# Patient Record
Sex: Female | Born: 1945 | Race: Black or African American | Hispanic: No | Marital: Single | Smoking: Current every day smoker
Health system: Southern US, Community
[De-identification: ages and names within clinical notes are randomized; demographics above are authoritative.]

## PROBLEM LIST (undated history)

## (undated) DIAGNOSIS — K4190 Unilateral femoral hernia, without obstruction or gangrene, not specified as recurrent: Secondary | ICD-10-CM

## (undated) DIAGNOSIS — J189 Pneumonia, unspecified organism: Secondary | ICD-10-CM

## (undated) DIAGNOSIS — D649 Anemia, unspecified: Secondary | ICD-10-CM

## (undated) DIAGNOSIS — R011 Cardiac murmur, unspecified: Secondary | ICD-10-CM

## (undated) DIAGNOSIS — N189 Chronic kidney disease, unspecified: Secondary | ICD-10-CM

## (undated) DIAGNOSIS — I10 Essential (primary) hypertension: Secondary | ICD-10-CM

## (undated) DIAGNOSIS — E785 Hyperlipidemia, unspecified: Secondary | ICD-10-CM

## (undated) HISTORY — PX: COLONOSCOPY: SHX174

## (undated) HISTORY — DX: Essential (primary) hypertension: I10

## (undated) HISTORY — PX: TONSILLECTOMY AND ADENOIDECTOMY: SUR1326

## (undated) HISTORY — DX: Hyperlipidemia, unspecified: E78.5

## (undated) HISTORY — DX: Anemia, unspecified: D64.9

## (undated) HISTORY — DX: Cardiac murmur, unspecified: R01.1

---

## 2004-08-20 ENCOUNTER — Ambulatory Visit: Payer: Self-pay | Admitting: *Deleted

## 2006-10-27 ENCOUNTER — Other Ambulatory Visit: Admission: RE | Admit: 2006-10-27 | Discharge: 2006-10-27 | Payer: Self-pay | Admitting: Internal Medicine

## 2011-01-01 ENCOUNTER — Other Ambulatory Visit (HOSPITAL_COMMUNITY): Payer: Self-pay | Admitting: Internal Medicine

## 2011-01-01 ENCOUNTER — Other Ambulatory Visit (HOSPITAL_BASED_OUTPATIENT_CLINIC_OR_DEPARTMENT_OTHER): Payer: Self-pay | Admitting: Internal Medicine

## 2011-01-01 DIAGNOSIS — Z1231 Encounter for screening mammogram for malignant neoplasm of breast: Secondary | ICD-10-CM

## 2011-01-09 ENCOUNTER — Ambulatory Visit (HOSPITAL_COMMUNITY)
Admission: RE | Admit: 2011-01-09 | Discharge: 2011-01-09 | Disposition: A | Discharge status: Home / Self Care | Payer: Self-pay | Source: Ambulatory Visit | Attending: Internal Medicine | Admitting: Internal Medicine

## 2011-01-09 DIAGNOSIS — Z1231 Encounter for screening mammogram for malignant neoplasm of breast: Secondary | ICD-10-CM | POA: Insufficient documentation

## 2013-06-23 ENCOUNTER — Encounter: Payer: Self-pay | Admitting: Gastroenterology

## 2013-06-23 ENCOUNTER — Other Ambulatory Visit: Payer: Self-pay | Admitting: Family Medicine

## 2013-06-23 DIAGNOSIS — Z1231 Encounter for screening mammogram for malignant neoplasm of breast: Secondary | ICD-10-CM

## 2013-06-30 ENCOUNTER — Encounter (INDEPENDENT_AMBULATORY_CARE_PROVIDER_SITE_OTHER): Payer: Self-pay | Admitting: Surgery

## 2013-06-30 ENCOUNTER — Ambulatory Visit (INDEPENDENT_AMBULATORY_CARE_PROVIDER_SITE_OTHER): Payer: Medicare Other | Admitting: Surgery

## 2013-06-30 VITALS — BP 152/82 | HR 76 | Resp 16 | Ht 64.0 in | Wt 137.8 lb

## 2013-06-30 DIAGNOSIS — K4190 Unilateral femoral hernia, without obstruction or gangrene, not specified as recurrent: Secondary | ICD-10-CM

## 2013-06-30 HISTORY — DX: Unilateral femoral hernia, without obstruction or gangrene, not specified as recurrent: K41.90

## 2013-06-30 NOTE — Patient Instructions (Addendum)
Hernia A hernia occurs when an internal organ pushes out through a weak spot in the abdominal wall. Hernias most commonly occur in the groin and around the navel. Hernias often can be pushed back into place (reduced). Most hernias tend to get worse over time. Some abdominal hernias can get stuck in the opening (irreducible or incarcerated hernia) and cannot be reduced. An irreducible abdominal hernia which is tightly squeezed into the opening is at risk for impaired blood supply (strangulated hernia). A strangulated hernia is a medical emergency. Because of the risk for an irreducible or strangulated hernia, surgery may be recommended to repair a hernia. CAUSES   Heavy lifting.  Prolonged coughing.  Straining to have a bowel movement.  A cut (incision) made during an abdominal surgery. HOME CARE INSTRUCTIONS   Bed rest is not required. You may continue your normal activities.  Avoid lifting more than 10 pounds (4.5 kg) or straining.  Cough gently. If you are a smoker it is best to stop. Even the best hernia repair can break down with the continual strain of coughing. Even if you do not have your hernia repaired, a cough will continue to aggravate the problem.  Do not wear anything tight over your hernia. Do not try to keep it in with an outside bandage or truss. These can damage abdominal contents if they are trapped within the hernia sac.  Eat a normal diet.  Avoid constipation. Straining over long periods of time will increase hernia size and encourage breakdown of repairs. If you cannot do this with diet alone, stool softeners may be used. SEEK IMMEDIATE MEDICAL CARE IF:   You have a fever.  You develop increasing abdominal pain.  You feel nauseous or vomit.  Your hernia is stuck outside the abdomen, looks discolored, feels hard, or is tender.  You have any changes in your bowel habits or in the hernia that are unusual for you.  You have increased pain or swelling around the  hernia.  You cannot push the hernia back in place by applying gentle pressure while lying down. MAKE SURE YOU:   Understand these instructions.  Will watch your condition.  Will get help right away if you are not doing well or get worse. Document Released: 10/14/2005 Document Revised: 01/06/2012 Document Reviewed: 06/02/2008 [facility] Patient Information 2014 [place].    CALL TO SET UP HERNIA SURGERY WHEN READY.

## 2013-06-30 NOTE — Progress Notes (Signed)
Patient ID: Bethany [name], female   DOB: 26-Jun-1946, 67 y.o.   MRN: [id number]  Chief Complaint  Patient presents with  . New Evaluation    eval ing hernia    HPI Bethany [name] is a 67 y.o. female.  Patient sent at the request of Dr. Kennon Holter for left groin bulge. Patient noticed it while picking up a small child. The bulge is more noticeable when she stands, lifts or coughs. It is located left groin. It is minimally tender. No associated nausea or vomiting. HPI  Past Medical History  Diagnosis Date  . Hypertension     Past Surgical History  Procedure Laterality Date  . Tonsillectomy and adenoidectomy      History reviewed. No pertinent family history.  Social History History  Substance Use Topics  . Smoking status: Current Every Day Smoker -- 0.50 packs/day  . Smokeless tobacco: Never Used  . Alcohol Use: No    No Known Allergies  Current Outpatient Prescriptions  Medication Sig Dispense Refill  . hydrochlorothiazide (HYDRODIURIL) 25 MG tablet Take 25 mg by mouth daily.       No current facility-administered medications for this visit.    Review of Systems Review of Systems  Constitutional: Negative.   HENT: Negative.   Eyes: Negative.   Respiratory: Positive for cough and wheezing.   Cardiovascular: Negative.   Gastrointestinal: Negative.   Endocrine: Negative.   Genitourinary: Negative.   Musculoskeletal: Negative.   Skin: Negative.   Allergic/Immunologic: Negative.   Neurological: Negative.   Hematological: Negative.   Psychiatric/Behavioral: Negative.     Blood pressure 152/82, pulse 76, resp. rate 16, height $RemoveBe'5\' 4"'eZAPSONpM$  (1.626 m), weight 137 lb 12.8 oz (62.506 kg).  Physical Exam Physical Exam  Constitutional: She is oriented to person, place, and time.    HENT:  Head: Normocephalic and atraumatic.  Eyes: EOM are normal. Pupils are equal, round, and reactive to light.  Neck: Normal range of motion. Neck supple.  Cardiovascular: Normal rate and  regular rhythm.   Pulmonary/Chest: She has no wheezes.  Abdominal:    Musculoskeletal: Normal range of motion.  Neurological: She is alert and oriented to person, place, and time.  Skin: Skin is warm and dry.  Psychiatric: She has a normal mood and affect. Her behavior is normal. Judgment and thought content normal.    Data Reviewed NONE AVAILABLE  Assessment    Left femoral hernia reducible minimally symptomatic    Plan    Recommend open repair left femoral hernia mesh. Patient takes care of multiple small children and will need time to adjust her schedule to set up surgery. Recommend she call back when she is able to coordinate surgery. Refrain from any lifting above 20 pounds. Risk of incarceration and strangulation I discussed. Recommend smoking cessation. Explained the risk of incarceration and strangulation with the patient especially with femoral hernias. She will call us back when she gets her schedule worked out.The risk of hernia repair include bleeding,  Infection,   Recurrence of the hernia,  Mesh use, chronic pain,  Organ injury,  Bowel injury,  Bladder injury,   nerve injury with numbness around the incision,  Death,  and worsening of preexisting  medical problems.  The alternatives to surgery have been discussed as well..  Long term expectations of both operative and non operative treatments have been discussed.   The patient agrees to proceed.       Bethany [name] A. 06/30/2013, 11:14 AM

## 2013-07-05 ENCOUNTER — Ambulatory Visit
Admission: RE | Admit: 2013-07-05 | Discharge: 2013-07-05 | Disposition: A | Discharge status: Home / Self Care | Payer: Medicaid Other | Source: Ambulatory Visit | Attending: Family Medicine | Admitting: Family Medicine

## 2013-07-05 DIAGNOSIS — Z1231 Encounter for screening mammogram for malignant neoplasm of breast: Secondary | ICD-10-CM

## 2013-09-01 ENCOUNTER — Ambulatory Visit (AMBULATORY_SURGERY_CENTER): Payer: Medicare Other

## 2013-09-01 VITALS — Ht 64.0 in | Wt 137.0 lb

## 2013-09-01 DIAGNOSIS — Z1211 Encounter for screening for malignant neoplasm of colon: Secondary | ICD-10-CM

## 2013-09-01 MED ORDER — SUPREP BOWEL PREP KIT 17.5-3.13-1.6 GM/177ML PO SOLN: 1.0000 | Freq: Once | ORAL | Status: DC

## 2013-09-14 ENCOUNTER — Encounter: Payer: Self-pay | Admitting: Gastroenterology

## 2013-09-14 ENCOUNTER — Ambulatory Visit (AMBULATORY_SURGERY_CENTER): Payer: Medicare Other | Admitting: Gastroenterology

## 2013-09-14 VITALS — BP 118/78 | HR 61 | Temp 97.2°F | Resp 21 | Ht 64.0 in | Wt 137.0 lb

## 2013-09-14 DIAGNOSIS — D126 Benign neoplasm of colon, unspecified: Secondary | ICD-10-CM

## 2013-09-14 DIAGNOSIS — K5730 Diverticulosis of large intestine without perforation or abscess without bleeding: Secondary | ICD-10-CM

## 2013-09-14 DIAGNOSIS — Z1211 Encounter for screening for malignant neoplasm of colon: Secondary | ICD-10-CM

## 2013-09-14 MED ORDER — SODIUM CHLORIDE 0.9 % IV SOLN: 500.0000 mL | INTRAVENOUS | Status: DC

## 2013-09-14 NOTE — Progress Notes (Signed)
Patient did not have preoperative order for IV antibiotic SSI prophylaxis. (G8918)  Patient did not experience any of the following events: a burn prior to discharge; a fall within the facility; wrong site/side/patient/procedure/implant event; or a hospital transfer or hospital admission upon discharge from the facility. (G8907)  

## 2013-09-14 NOTE — Patient Instructions (Signed)
YOU HAD AN ENDOSCOPIC PROCEDURE TODAY AT THE [facility]: Refer to the procedure report that was given to you for any specific questions about what was found during the examination.  If the procedure report does not answer your questions, please call your gastroenterologist to clarify.  If you requested that your care partner not be given the details of your procedure findings, then the procedure report has been included in a sealed envelope for you to review at your convenience later.  YOU SHOULD EXPECT: Some feelings of bloating in the abdomen. Passage of more gas than usual.  Walking can help get rid of the air that was put into your GI tract during the procedure and reduce the bloating. If you had a lower endoscopy (such as a colonoscopy or flexible sigmoidoscopy) you may notice spotting of blood in your stool or on the toilet paper. If you underwent a bowel prep for your procedure, then you may not have a normal bowel movement for a few days.  DIET: Your first meal following the procedure should be a light meal and then it is ok to progress to your normal diet.  A half-sandwich or bowl of soup is an example of a good first meal.  Heavy or fried foods are harder to digest and may make you feel nauseous or bloated.  Likewise meals heavy in dairy and vegetables can cause extra gas to form and this can also increase the bloating.  Drink plenty of fluids but you should avoid alcoholic beverages for 24 hours.  ACTIVITY: Your care partner should take you home directly after the procedure.  You should plan to take it easy, moving slowly for the rest of the day.  You can resume normal activity the day after the procedure however you should NOT DRIVE or use heavy machinery for 24 hours (because of the sedation medicines used during the test).    SYMPTOMS TO REPORT IMMEDIATELY: A gastroenterologist can be reached at any hour.  During normal business hours, 8:30 AM to 5:00 PM Monday through Friday,  call [phone].  After hours and on weekends, please call the GI answering service at [phone] who will take a message and have the physician on call contact you.   Following lower endoscopy (colonoscopy or flexible sigmoidoscopy):  Excessive amounts of blood in the stool  Significant tenderness or worsening of abdominal pains  Swelling of the abdomen that is new, acute  Fever of 100F or higher  FOLLOW UP: If any biopsies were taken you will be contacted by phone or by letter within the next 1-3 weeks.  Call your gastroenterologist if you have not heard about the biopsies in 3 weeks.  Our staff will call the home number listed on your records the next business day following your procedure to check on you and address any questions or concerns that you may have at that time regarding the information given to you following your procedure. This is a courtesy call and so if there is no answer at the home number and we have not heard from you through the emergency physician on call, we will assume that you have returned to your regular daily activities without incident.  SIGNATURES/CONFIDENTIALITY: You and/or your care partner have signed paperwork which will be entered into your electronic medical record.  These signatures attest to the fact that that the information above on your After Visit Summary has been reviewed and is understood.  Full responsibility of the confidentiality of this   discharge information lies with you and/or your care-partner.  Recommendations See procedure report  

## 2013-09-14 NOTE — Progress Notes (Signed)
Called to room to assist during endoscopic procedure.  Patient ID and intended procedure confirmed with present staff. Received instructions for my participation in the procedure from the performing physician.  

## 2013-09-14 NOTE — Op Note (Signed)
[facility]  [address] [place], [id number]   COLONOSCOPY PROCEDURE REPORT  PATIENT: Edita, Weyenberg  MR#: [id number] BIRTHDATE: 1946/09/13 , 37  yrs. old GENDER: Female ENDOSCOPIST: Inda Castle, MD REFERRED DG:[id number] Amil Amen, M.D. PROCEDURE DATE:  09/14/2013 PROCEDURE:   Colonoscopy with snare polypectomy First Screening Colonoscopy - Avg.  risk and is 50 yrs.  old or older Yes.  Prior Negative Screening - Now for repeat screening. N/A  History of Adenoma - Now for follow-up colonoscopy & has been > or = to 3 yrs.  N/A  Polyps Removed Today? Yes. ASA CLASS:   Class II INDICATIONS:Average risk patient for colon cancer. MEDICATIONS: MAC sedation, administered by CRNA, Propofol (Diprivan), and Propofol (Diprivan) 270 mg IV  DESCRIPTION OF PROCEDURE:   After the risks benefits and alternatives of the procedure were thoroughly explained, informed consent was obtained.  A digital rectal exam revealed no abnormalities of the rectum.   The LB [id number]  endoscope was introduced through the anus and advanced to the cecum, which was identified by both the appendix and ileocecal valve. No adverse events experienced.   The quality of the prep was excellent using Suprep  The instrument was then slowly withdrawn as the colon was fully examined.      COLON FINDINGS: A pedunculated polyp measuring 15 mm in size was found in the sigmoid colon.  A polypectomy was performed using snare cautery.  The resection was complete and the polyp tissue was completely retrieved.   Mild diverticulosis was noted at the cecum. Moderate diverticulosis was noted in the sigmoid colon.   The colon mucosa was otherwise normal.  Retroflexed views revealed no abnormalities. The time to cecum=5 minutes 54 seconds.  Withdrawal time=10 minutes 36 seconds.  The scope was withdrawn and the procedure completed. COMPLICATIONS: There were no complications.  ENDOSCOPIC  IMPRESSION: 1.   Pedunculated polyp measuring 15 mm in size was found in the sigmoid colon; polypectomy was performed using snare cautery 2.   Mild diverticulosis was noted at the cecum 3.   Moderate diverticulosis was noted in the sigmoid colon 4.   The colon mucosa was otherwise normal  RECOMMENDATIONS: Colonoscopy 3 years   eSigned:  Inda Castle, MD 09/14/2013 10:36 AM   cc:   PATIENT NAME:  Loray, Akard MR#: [id number]

## 2013-09-14 NOTE — Progress Notes (Signed)
Report to pacu rn, vss, bbs=clear 

## 2013-09-15 ENCOUNTER — Telehealth: Payer: Self-pay | Admitting: *Deleted

## 2013-09-15 NOTE — Telephone Encounter (Signed)
  Follow up Call-  Call back number 09/14/2013  Post procedure Call Back phone  # [phone]  Permission to leave phone message Yes     Patient questions:  Do you have a fever, pain , or abdominal swelling? no Pain Score  0 *  Have you tolerated food without any problems? yes  Have you been able to return to your normal activities? yes  Do you have any questions about your discharge instructions: Diet   no Medications  no Follow up visit  no  Do you have questions or concerns about your Care? no  Actions: * If pain score is 4 or above: No action needed, pain <4.

## 2013-09-21 ENCOUNTER — Encounter: Payer: Self-pay | Admitting: Gastroenterology

## 2014-04-06 ENCOUNTER — Encounter (HOSPITAL_COMMUNITY): Payer: Self-pay | Admitting: Emergency Medicine

## 2014-04-06 ENCOUNTER — Emergency Department (HOSPITAL_COMMUNITY): Payer: Medicare HMO

## 2014-04-06 ENCOUNTER — Other Ambulatory Visit: Payer: Self-pay | Admitting: Family Medicine

## 2014-04-06 ENCOUNTER — Emergency Department (HOSPITAL_COMMUNITY)
Admission: EM | Admit: 2014-04-06 | Discharge: 2014-04-06 | Disposition: A | Discharge status: Home / Self Care | Payer: Medicare HMO | Attending: Emergency Medicine | Admitting: Emergency Medicine

## 2014-04-06 ENCOUNTER — Ambulatory Visit
Admission: RE | Admit: 2014-04-06 | Discharge: 2014-04-06 | Disposition: A | Discharge status: Home / Self Care | Payer: Commercial Managed Care - HMO | Source: Ambulatory Visit | Attending: Family Medicine | Admitting: Family Medicine

## 2014-04-06 DIAGNOSIS — J159 Unspecified bacterial pneumonia: Secondary | ICD-10-CM | POA: Diagnosis not present

## 2014-04-06 DIAGNOSIS — J189 Pneumonia, unspecified organism: Secondary | ICD-10-CM

## 2014-04-06 DIAGNOSIS — Z79899 Other long term (current) drug therapy: Secondary | ICD-10-CM | POA: Insufficient documentation

## 2014-04-06 DIAGNOSIS — I10 Essential (primary) hypertension: Secondary | ICD-10-CM | POA: Diagnosis not present

## 2014-04-06 DIAGNOSIS — R05 Cough: Secondary | ICD-10-CM | POA: Diagnosis present

## 2014-04-06 DIAGNOSIS — J209 Acute bronchitis, unspecified: Secondary | ICD-10-CM

## 2014-04-06 DIAGNOSIS — F17200 Nicotine dependence, unspecified, uncomplicated: Secondary | ICD-10-CM | POA: Insufficient documentation

## 2014-04-06 DIAGNOSIS — R059 Cough, unspecified: Secondary | ICD-10-CM | POA: Diagnosis present

## 2014-04-06 LAB — COMPREHENSIVE METABOLIC PANEL
ALK PHOS: 87 U/L (ref 39–117)
ALT: 14 U/L (ref 0–35)
AST: 19 U/L (ref 0–37)
Albumin: 3 g/dL — ABNORMAL LOW (ref 3.5–5.2)
BUN: 37 mg/dL — AB (ref 6–23)
CALCIUM: 9.5 mg/dL (ref 8.4–10.5)
CO2: 20 mEq/L (ref 19–32)
Chloride: 96 mEq/L (ref 96–112)
Creatinine, Ser: 1.72 mg/dL — ABNORMAL HIGH (ref 0.50–1.10)
GFR calc non Af Amer: 30 mL/min — ABNORMAL LOW (ref >90)
GFR, EST AFRICAN AMERICAN: 35 mL/min — AB (ref >90)
GLUCOSE: 99 mg/dL (ref 70–99)
Potassium: 4.1 mEq/L (ref 3.7–5.3)
Sodium: 135 mEq/L — ABNORMAL LOW (ref 137–147)
Total Bilirubin: 0.6 mg/dL (ref 0.3–1.2)
Total Protein: 7.8 g/dL (ref 6.0–8.3)

## 2014-04-06 LAB — CBC WITH DIFFERENTIAL/PLATELET
BASOS PCT: 0 % (ref 0–1)
Basophils Absolute: 0 10*3/uL (ref 0.0–0.1)
Eosinophils Absolute: 0 10*3/uL (ref 0.0–0.7)
Eosinophils Relative: 0 % (ref 0–5)
HCT: 36.3 % (ref 36.0–46.0)
HEMOGLOBIN: 12.2 g/dL (ref 12.0–15.0)
LYMPHS ABS: 4.1 10*3/uL — AB (ref 0.7–4.0)
Lymphocytes Relative: 27 % (ref 12–46)
MCH: 29.5 pg (ref 26.0–34.0)
MCHC: 33.6 g/dL (ref 30.0–36.0)
MCV: 87.9 fL (ref 78.0–100.0)
MONO ABS: 1.8 10*3/uL — AB (ref 0.1–1.0)
Monocytes Relative: 12 % (ref 3–12)
NEUTROS ABS: 9.4 10*3/uL — AB (ref 1.7–7.7)
Neutrophils Relative %: 61 % (ref 43–77)
Platelets: 439 10*3/uL — ABNORMAL HIGH (ref 150–400)
RBC: 4.13 MIL/uL (ref 3.87–5.11)
RDW: 14 % (ref 11.5–15.5)
WBC: 15.3 10*3/uL — ABNORMAL HIGH (ref 4.0–10.5)

## 2014-04-06 IMAGING — CR DG CHEST 2V
2 series · 2 of 2 positions shown · non-contrast
Comparison: None.

CLINICAL DATA: Cough and fever.  Pneumonia

EXAM:
CHEST  2 VIEW

[w chest pa]
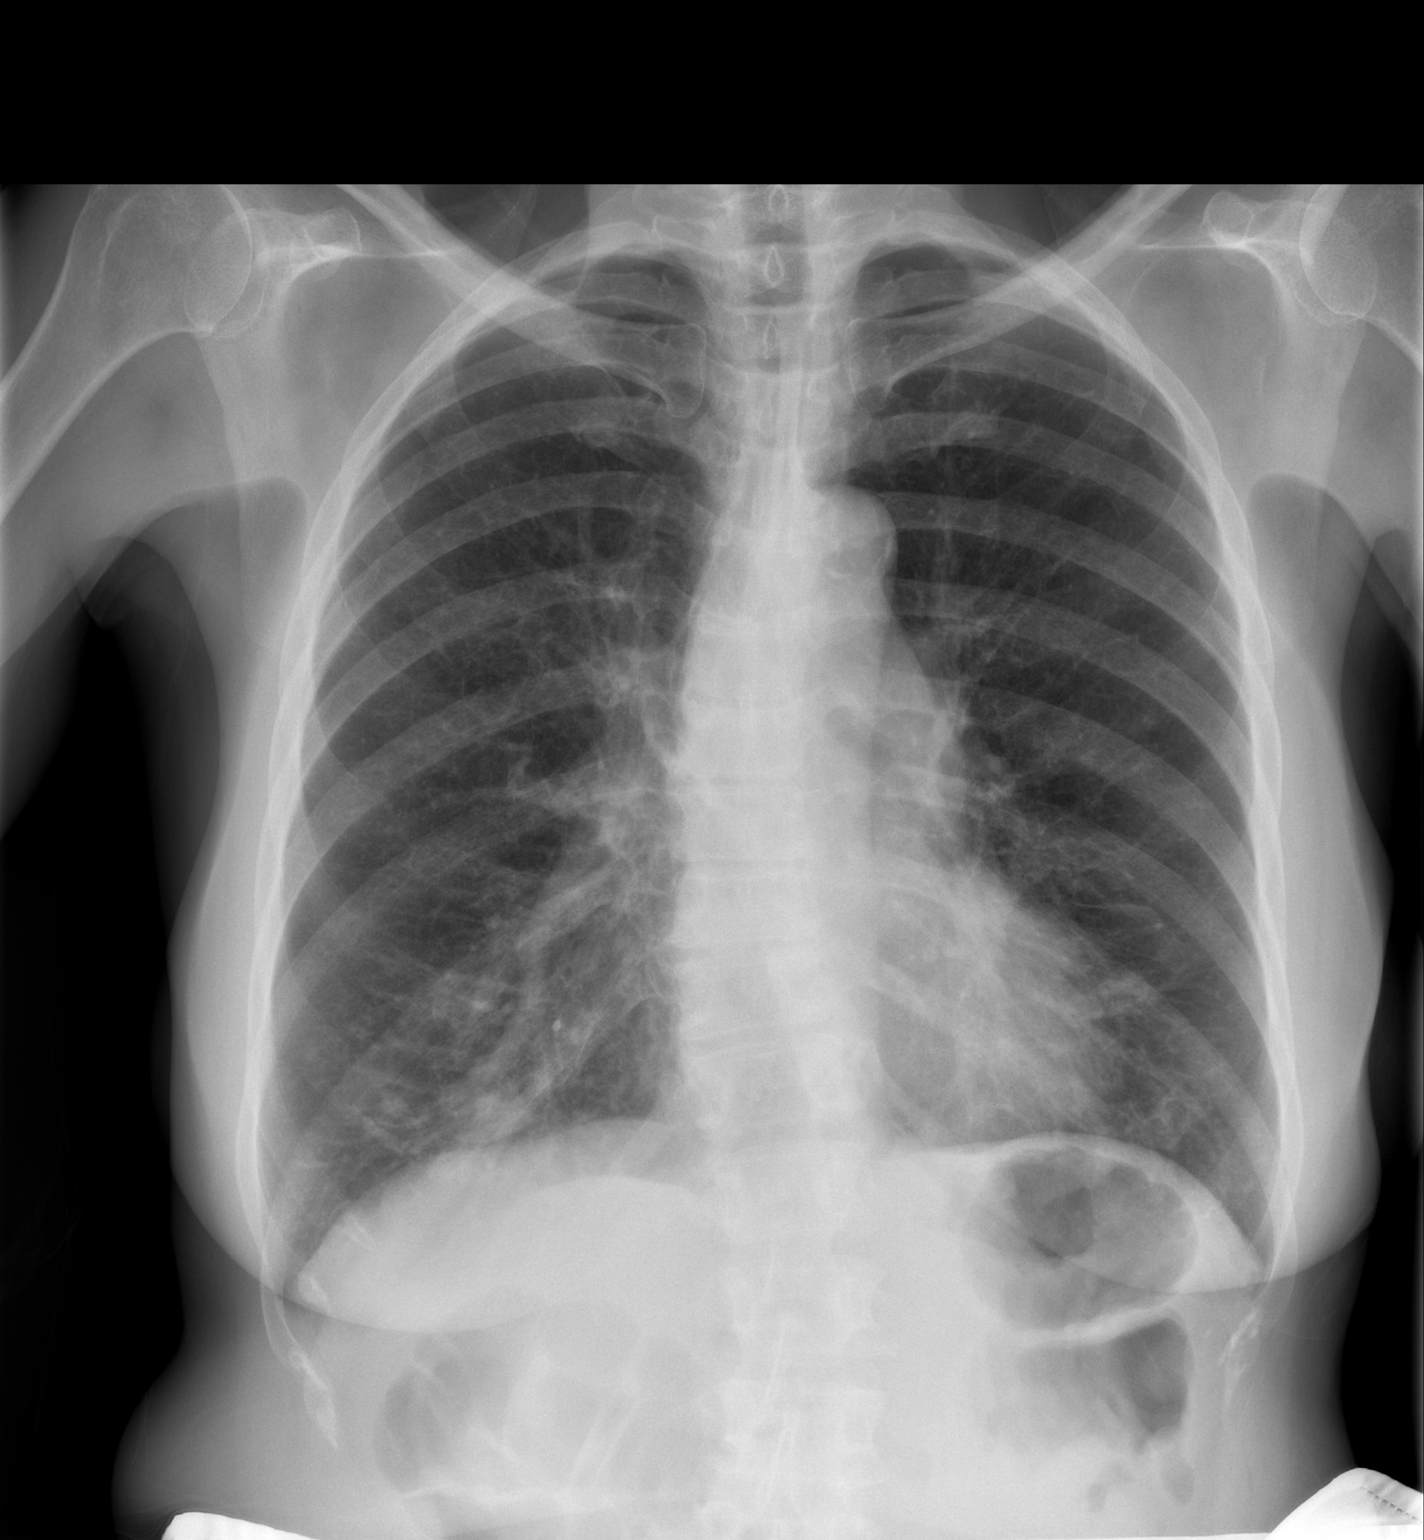

[w chest lat]
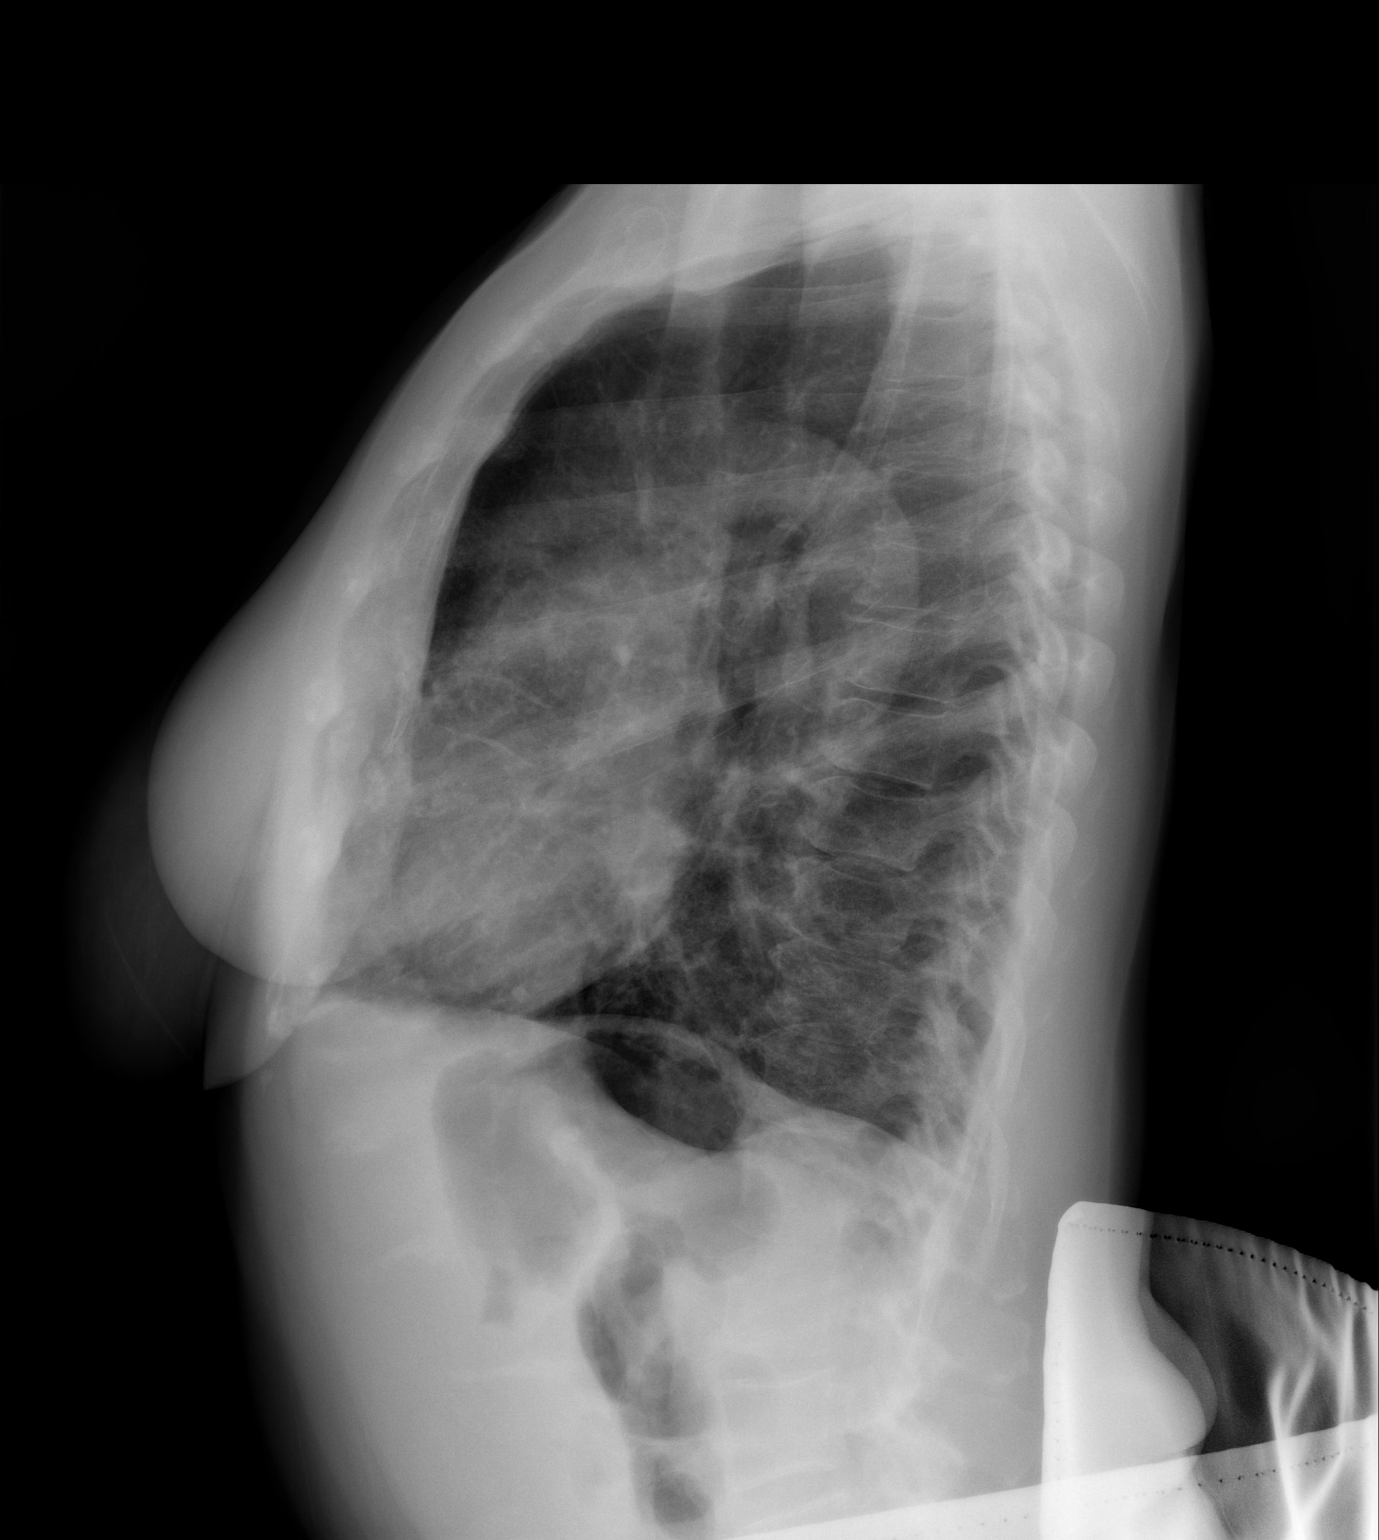

[2 of 2 positions shown; findings below may reference images not displayed]

FINDINGS: Patchy bibasilar airspace disease, with right middle lobe infiltrate
also noted. Findings most consistent with pneumonia given the
history.

There is hyperinflation and chronic lung disease with COPD. No prior
studies are available to determine if lung markings in the bases may
be partially chronic. No effusion.
IMPRESSION: COPD. Bibasilar infiltrates consistent with pneumonia. No prior
studies are available determine baseline. Followup until clearing
suggested.

## 2014-04-06 IMAGING — CR DG CHEST 2V
2 series · 2 of 2 positions shown · non-contrast
Comparison: [DATE]

CLINICAL DATA: Cough and fever

EXAM:
CHEST  2 VIEW

[w chest pa]
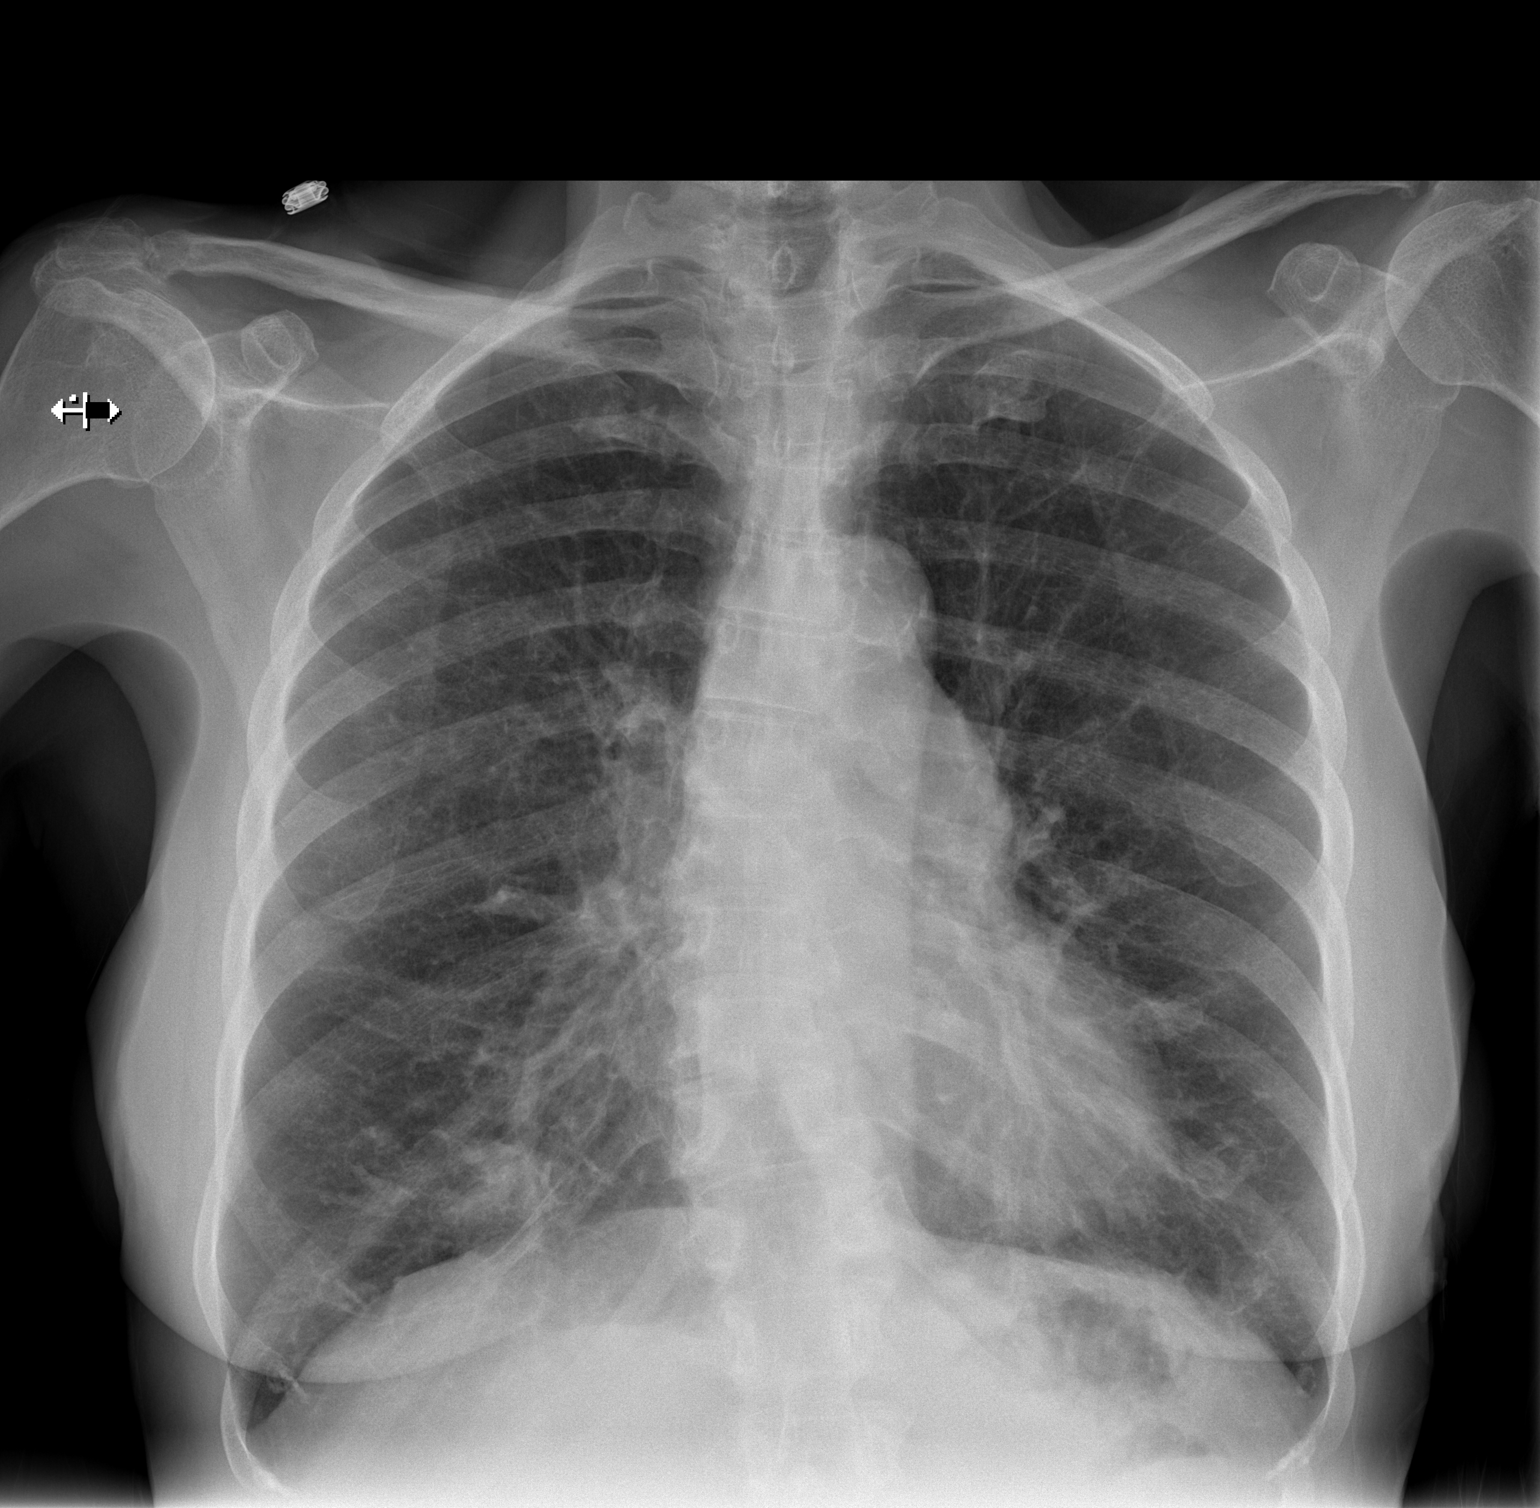

[w chest lat]
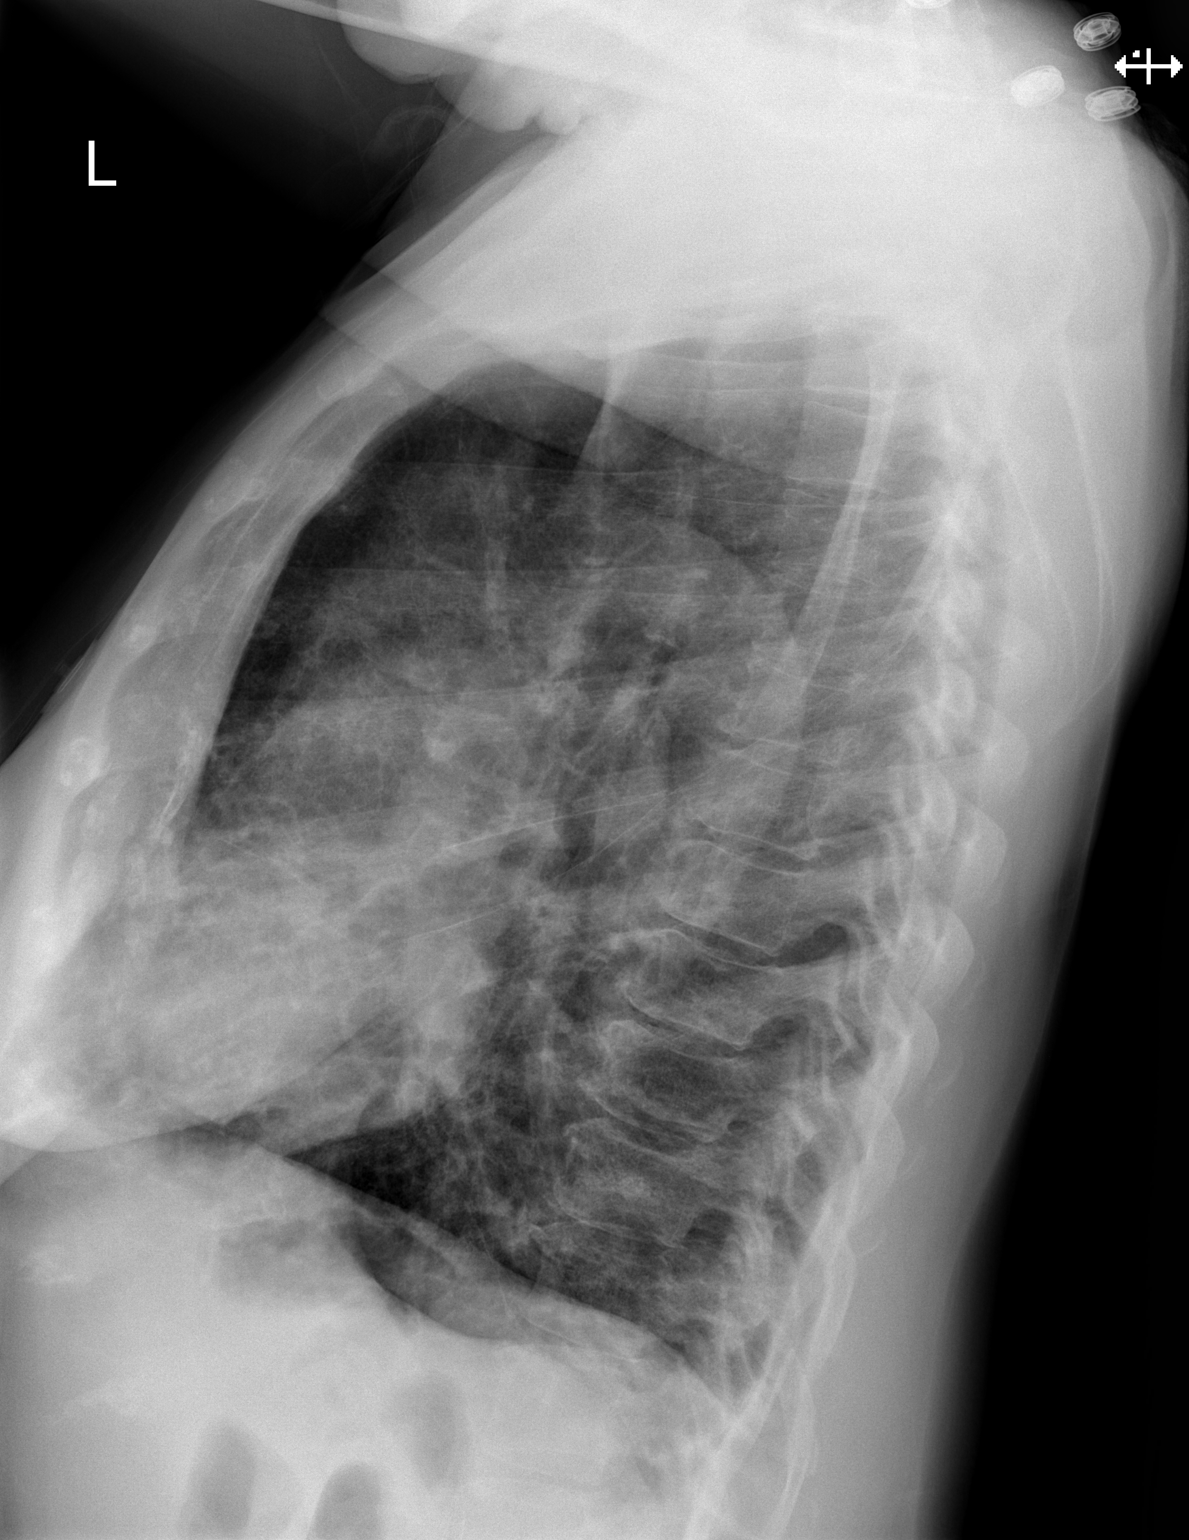

[2 of 2 positions shown; findings below may reference images not displayed]

FINDINGS: There is underlying emphysema. There is patchy infiltrate in the
right base. Elsewhere lungs are clear. Heart size is normal.
Pulmonary vascularity reflects underlying emphysema. No adenopathy.
There is mid thoracic dextroscoliosis.
IMPRESSION: Patchy infiltrate right base.  Underlying emphysematous change.

## 2014-04-06 MED ORDER — PREDNISONE 20 MG PO TABS: 40.0000 mg | ORAL_TABLET | Freq: Every day | ORAL | Status: AC

## 2014-04-06 MED ORDER — LEVOFLOXACIN 500 MG PO TABS
500.0000 mg | ORAL_TABLET | Freq: Once | ORAL | Status: AC
  Administered 2014-04-06: 500 mg via ORAL
  Filled 2014-04-06: qty 1

## 2014-04-06 MED ORDER — LEVOFLOXACIN 500 MG PO TABS: 500.0000 mg | ORAL_TABLET | Freq: Every day | ORAL | Status: DC

## 2014-04-06 MED ORDER — PREDNISONE 20 MG PO TABS
60.0000 mg | ORAL_TABLET | ORAL | Status: AC
  Administered 2014-04-06: 60 mg via ORAL
  Filled 2014-04-06: qty 3

## 2014-04-06 MED ORDER — ALBUTEROL SULFATE HFA 108 (90 BASE) MCG/ACT IN AERS
2.0000 | INHALATION_SPRAY | RESPIRATORY_TRACT | Status: DC
Start: 2014-04-06 — End: 2014-04-07
  Administered 2014-04-06: 2 via RESPIRATORY_TRACT
  Filled 2014-04-06: qty 6.7

## 2014-04-06 MED ORDER — HYDROCODONE-HOMATROPINE 5-1.5 MG/5ML PO SYRP: 5.0000 mL | ORAL_SOLUTION | Freq: Four times a day (QID) | ORAL | Status: DC | PRN

## 2014-04-06 NOTE — Discharge Instructions (Signed)
As discussed, there is evidence for pneumonia. Please take medication as directed, including albuterol, every 4 hours for the next 2 days. Please be sure to follow up with her primary care physician.  In addition, please to discuss your elevated creatinine which is a measure of kidney function with your primary care physician.  Return here for any concerning changes in your condition

## 2014-04-06 NOTE — ED Provider Notes (Signed)
CSN: [id number]     Arrival date & time 04/06/14  1642 History   First MD Initiated Contact with Patient 04/06/14 1849     Chief Complaint  Patient presents with  . Cough      HPI  Patient presents with 2 weeks of cough. She also complains chills, denies pain. She denies nausea, vomiting, confusion, disorientation. Patient is a smoker. Since onset there has been no clear alleviating or exacerbating factors associated with cough and chills. She denies syncope and weakness.   Past Medical History  Diagnosis Date  . Hypertension    Past Surgical History  Procedure Laterality Date  . Tonsillectomy and adenoidectomy     Family History  Problem Relation Age of Onset  . Colon cancer Neg Hx   . Pancreatic cancer Neg Hx   . Stomach cancer Neg Hx    History  Substance Use Topics  . Smoking status: Current Every Day Smoker -- 0.50 packs/day  . Smokeless tobacco: Never Used  . Alcohol Use: 4.2 oz/week    7 Glasses of wine per week   OB History   Grav Para Term Preterm Abortions TAB SAB Ect Mult Living                 Review of Systems  Constitutional:       Per HPI, otherwise negative  HENT:       Per HPI, otherwise negative  Respiratory:       Per HPI, otherwise negative  Cardiovascular:       Per HPI, otherwise negative  Gastrointestinal: Negative for vomiting.  Endocrine:       Negative aside from HPI  Genitourinary:       Neg aside from HPI   Musculoskeletal:       Per HPI, otherwise negative  Skin: Negative.   Neurological: Negative for syncope.      Allergies  Review of patient's allergies indicates no known allergies.  Home Medications   Prior to Admission medications   Medication Sig Start Date End Date Taking? Authorizing Provider  Ascorbic Acid (VITAMIN C PO) Take 1 tablet by mouth daily.   Yes Historical Provider, MD  Coenzyme Q10 (CO Q 10 PO) Take 1 capsule by mouth daily.   Yes Historical Provider, MD  Cyanocobalamin (B-12 PO) Take 1 tablet  by mouth daily.   Yes Historical Provider, MD  hydrochlorothiazide (HYDRODIURIL) 25 MG tablet Take 25 mg by mouth daily.   Yes Historical Provider, MD  KLOR-CON M20 20 MEQ tablet Take 20 mEq by mouth daily. 03/17/14  Yes Historical Provider, MD   BP 137/78  Pulse 80  Temp(Src) 99.6 F (37.6 C) (Oral)  Resp 26  Wt 126 lb (57.153 kg)  SpO2 93% Physical Exam  Nursing note and vitals reviewed. Constitutional: She is oriented to person, place, and time. She appears well-developed and well-nourished. No distress.  HENT:  Head: Normocephalic and atraumatic.  Eyes: Conjunctivae and EOM are normal.  Cardiovascular: Normal rate and regular rhythm.   Pulmonary/Chest: Effort normal. No stridor. No respiratory distress.  Slightly coarse LS bilaterally.  No wheezing  Abdominal: She exhibits no distension.  Musculoskeletal: She exhibits no edema.  Neurological: She is alert and oriented to person, place, and time. No cranial nerve deficit.  Skin: Skin is warm and dry.  Psychiatric: She has a normal mood and affect.    ED Course  Procedures (including critical care time) Labs Review Labs Reviewed  CBC WITH DIFFERENTIAL - Abnormal; Notable  for the following:    WBC 15.3 (*)    Platelets 439 (*)    Neutro Abs 9.4 (*)    Lymphs Abs 4.1 (*)    Monocytes Absolute 1.8 (*)    All other components within normal limits  COMPREHENSIVE METABOLIC PANEL - Abnormal; Notable for the following:    Sodium 135 (*)    BUN 37 (*)    Creatinine, Ser 1.72 (*)    Albumin 3.0 (*)    GFR calc non Af Amer 30 (*)    GFR calc Af Amer 35 (*)    All other components within normal limits    Imaging Review Dg Chest 2 View  04/06/2014   CLINICAL DATA:  Cough and fever  EXAM: CHEST  2 VIEW  COMPARISON:  April 06, 2014  FINDINGS: There is underlying emphysema. There is patchy infiltrate in the right base. Elsewhere lungs are clear. Heart size is normal. Pulmonary vascularity reflects underlying emphysema. No  adenopathy. There is mid thoracic dextroscoliosis.  IMPRESSION: Patchy infiltrate right base.  Underlying emphysematous change.   Electronically Signed   By: Lowella Grip M.D.   On: 04/06/2014 20:31   Dg Chest 2 View  04/06/2014   CLINICAL DATA:  Cough and fever.  Pneumonia  EXAM: CHEST  2 VIEW  COMPARISON:  None.  FINDINGS: Patchy bibasilar airspace disease, with right middle lobe infiltrate also noted. Findings most consistent with pneumonia given the history.  There is hyperinflation and chronic lung disease with COPD. No prior studies are available to determine if lung markings in the bases may be partially chronic. No effusion.  IMPRESSION: COPD. Bibasilar infiltrates consistent with pneumonia. No prior studies are available determine baseline. Followup until clearing suggested.   Electronically Signed   By: Franchot Gallo M.D.   On: 04/06/2014 14:44    MDM   Patient with a long history of smoking now presents with cough, chills. Patient also has mild elevation in creatinine, though no other substantial collateral abnormalities. Patient was started on antibiotics, steroids, given her chronic smoking and some concern for chronic lung disease. Patient was discharged, hemodynamically stable, to followup with primary care.      Carmin Muskrat, MD 04/06/14 2103

## 2014-04-06 NOTE — ED Notes (Signed)
Pt presents to department for evaluation of productive cough. Ongoing x2 weeks. Was sent by PCP for further evaluation. Respirations unlabored. Pt speaking complete sentences. Denies pain at the time. Pt is alert and oriented x4.

## 2014-04-08 ENCOUNTER — Telehealth: Payer: Self-pay

## 2014-04-08 NOTE — Telephone Encounter (Signed)
Dr. Macky Lower office is closed on Fridays, I'll keep in my box and call Monday morning.   Just forwarding as a fyi.

## 2014-04-08 NOTE — Telephone Encounter (Signed)
Message copied by Len Blalock on Fri Apr 08, 2014 10:44 AM ------      Message from: Juanito Doom      Created: Fri Apr 08, 2014  7:42 AM      Regarding: Appointment       A,            This is Dr Blount's patient.  He wanted [place] to see her in the ED but the ED never called.  Can you call his office today to see if he wants [place] to see her in clinic?            Thanks,            Ruby Cola ------

## 2014-04-11 NOTE — Telephone Encounter (Signed)
lmtcb X1 at Dr. Macky Lower office to see if this pt needs to be scheduled for a follow up in clilnic.

## 2014-04-13 NOTE — Telephone Encounter (Signed)
Spoke with Dr. Kennon Holter at his office, he said that this patient has a hospital follow-up scheduled with him, he will assess her and refer her over to [place] based on her status at this visit.  I offered to make an appt for her today but he stated that he would refer after the visit.  Nothing further needed. Just forwarding as an fyi.

## 2015-06-28 ENCOUNTER — Other Ambulatory Visit (HOSPITAL_COMMUNITY): Payer: Self-pay | Admitting: Family Medicine

## 2015-06-28 DIAGNOSIS — Z1231 Encounter for screening mammogram for malignant neoplasm of breast: Secondary | ICD-10-CM

## 2015-07-04 ENCOUNTER — Ambulatory Visit (HOSPITAL_COMMUNITY): Payer: Self-pay

## 2015-07-18 ENCOUNTER — Other Ambulatory Visit: Payer: Self-pay | Admitting: Family Medicine

## 2015-07-18 DIAGNOSIS — E2839 Other primary ovarian failure: Secondary | ICD-10-CM

## 2015-07-18 DIAGNOSIS — Z1231 Encounter for screening mammogram for malignant neoplasm of breast: Secondary | ICD-10-CM

## 2015-07-28 ENCOUNTER — Other Ambulatory Visit: Payer: Self-pay

## 2015-07-28 ENCOUNTER — Ambulatory Visit: Payer: Self-pay

## 2015-10-28 ENCOUNTER — Emergency Department (HOSPITAL_COMMUNITY): Payer: Medicaid Other

## 2015-10-28 ENCOUNTER — Emergency Department (HOSPITAL_COMMUNITY)
Admission: EM | Admit: 2015-10-28 | Discharge: 2015-10-28 | Disposition: A | Discharge status: Home / Self Care | Payer: Medicaid Other | Attending: Emergency Medicine | Admitting: Emergency Medicine

## 2015-10-28 ENCOUNTER — Encounter (HOSPITAL_COMMUNITY): Payer: Self-pay | Admitting: Emergency Medicine

## 2015-10-28 DIAGNOSIS — I10 Essential (primary) hypertension: Secondary | ICD-10-CM | POA: Diagnosis not present

## 2015-10-28 DIAGNOSIS — F17200 Nicotine dependence, unspecified, uncomplicated: Secondary | ICD-10-CM | POA: Diagnosis not present

## 2015-10-28 DIAGNOSIS — J189 Pneumonia, unspecified organism: Secondary | ICD-10-CM

## 2015-10-28 DIAGNOSIS — Z79899 Other long term (current) drug therapy: Secondary | ICD-10-CM | POA: Insufficient documentation

## 2015-10-28 DIAGNOSIS — J159 Unspecified bacterial pneumonia: Secondary | ICD-10-CM | POA: Diagnosis not present

## 2015-10-28 DIAGNOSIS — R509 Fever, unspecified: Secondary | ICD-10-CM | POA: Diagnosis present

## 2015-10-28 IMAGING — DX DG CHEST 2V
2 series · 2 of 2 positions shown · non-contrast
Comparison: [DATE]

CLINICAL DATA: Productive cough with chills and fever.

EXAM:
CHEST  2 VIEW

[chest pa]
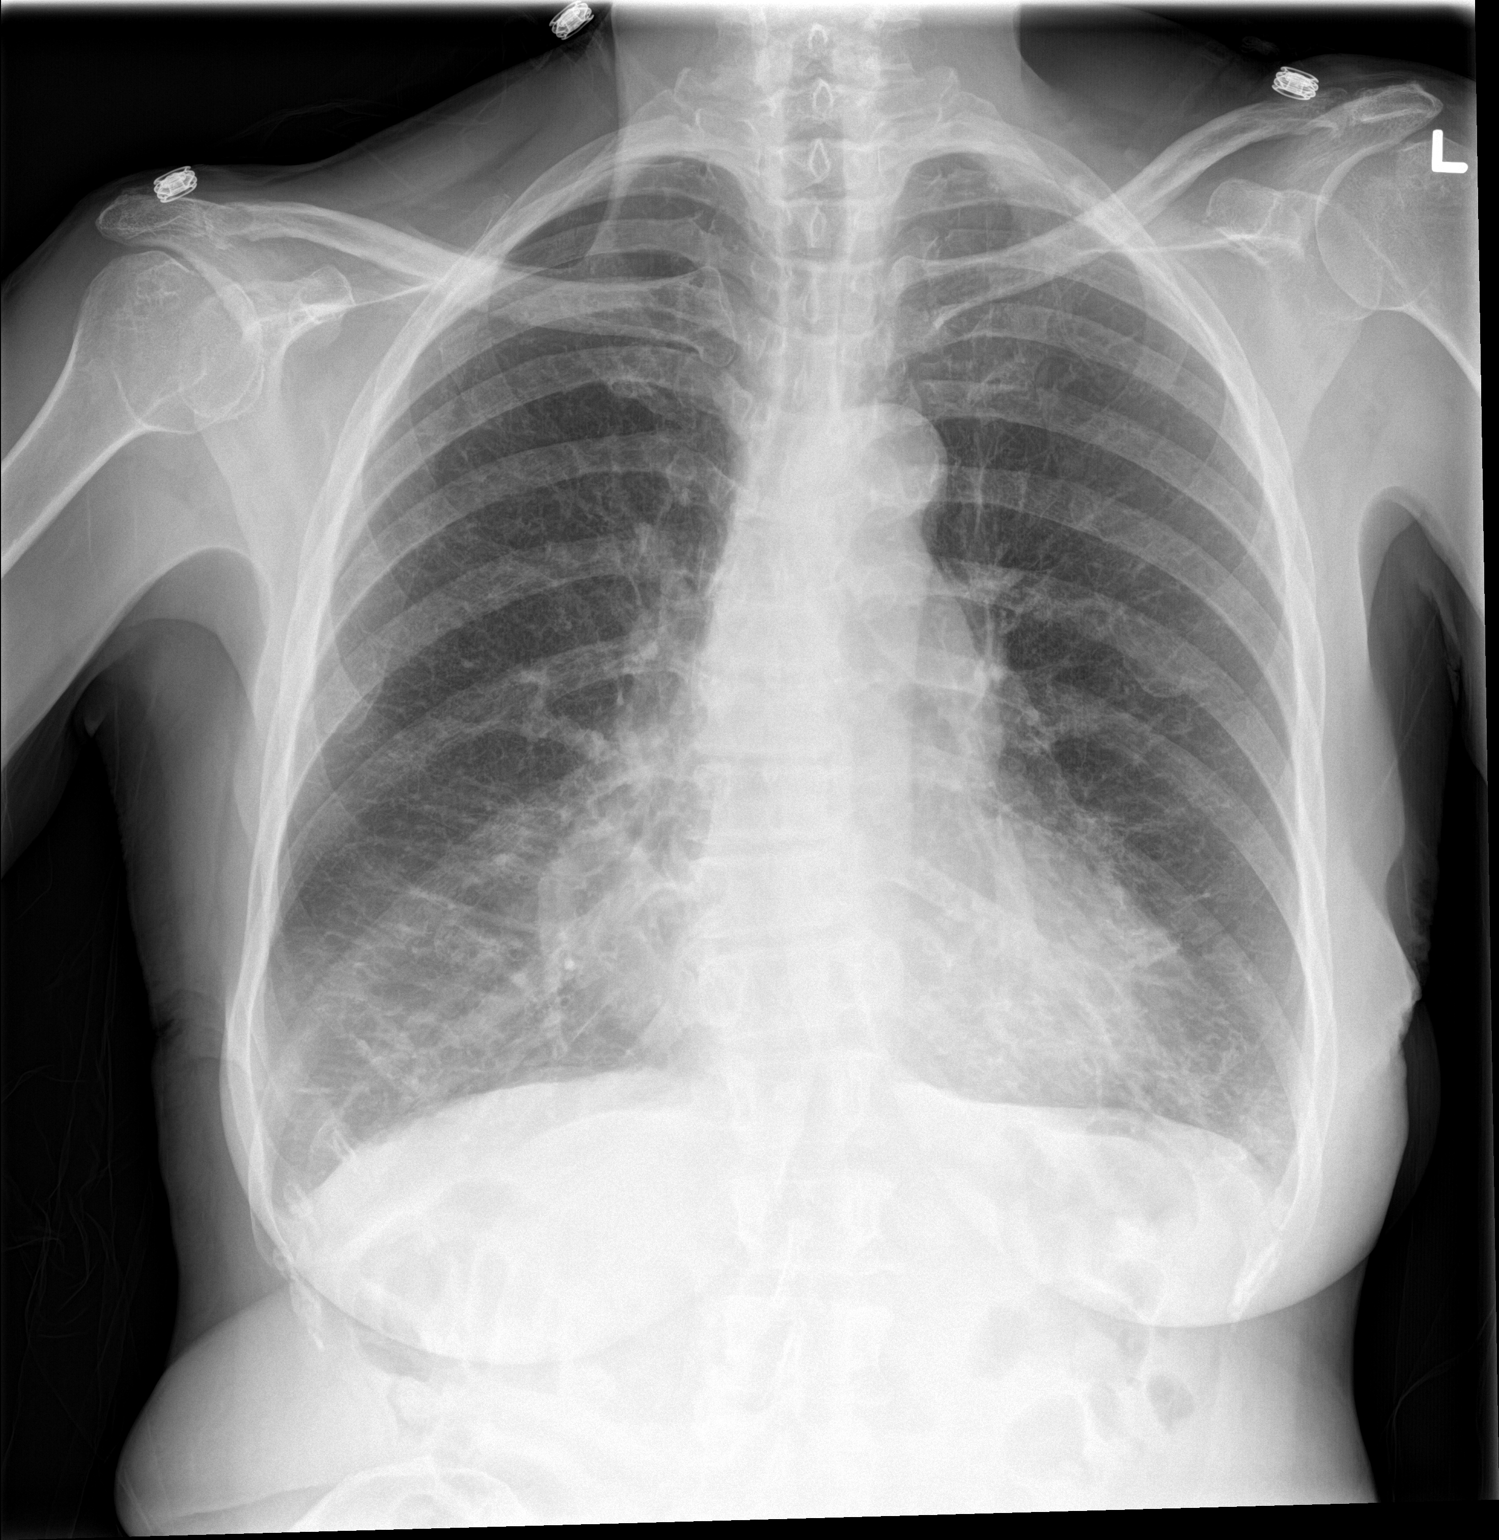

[chest lat]
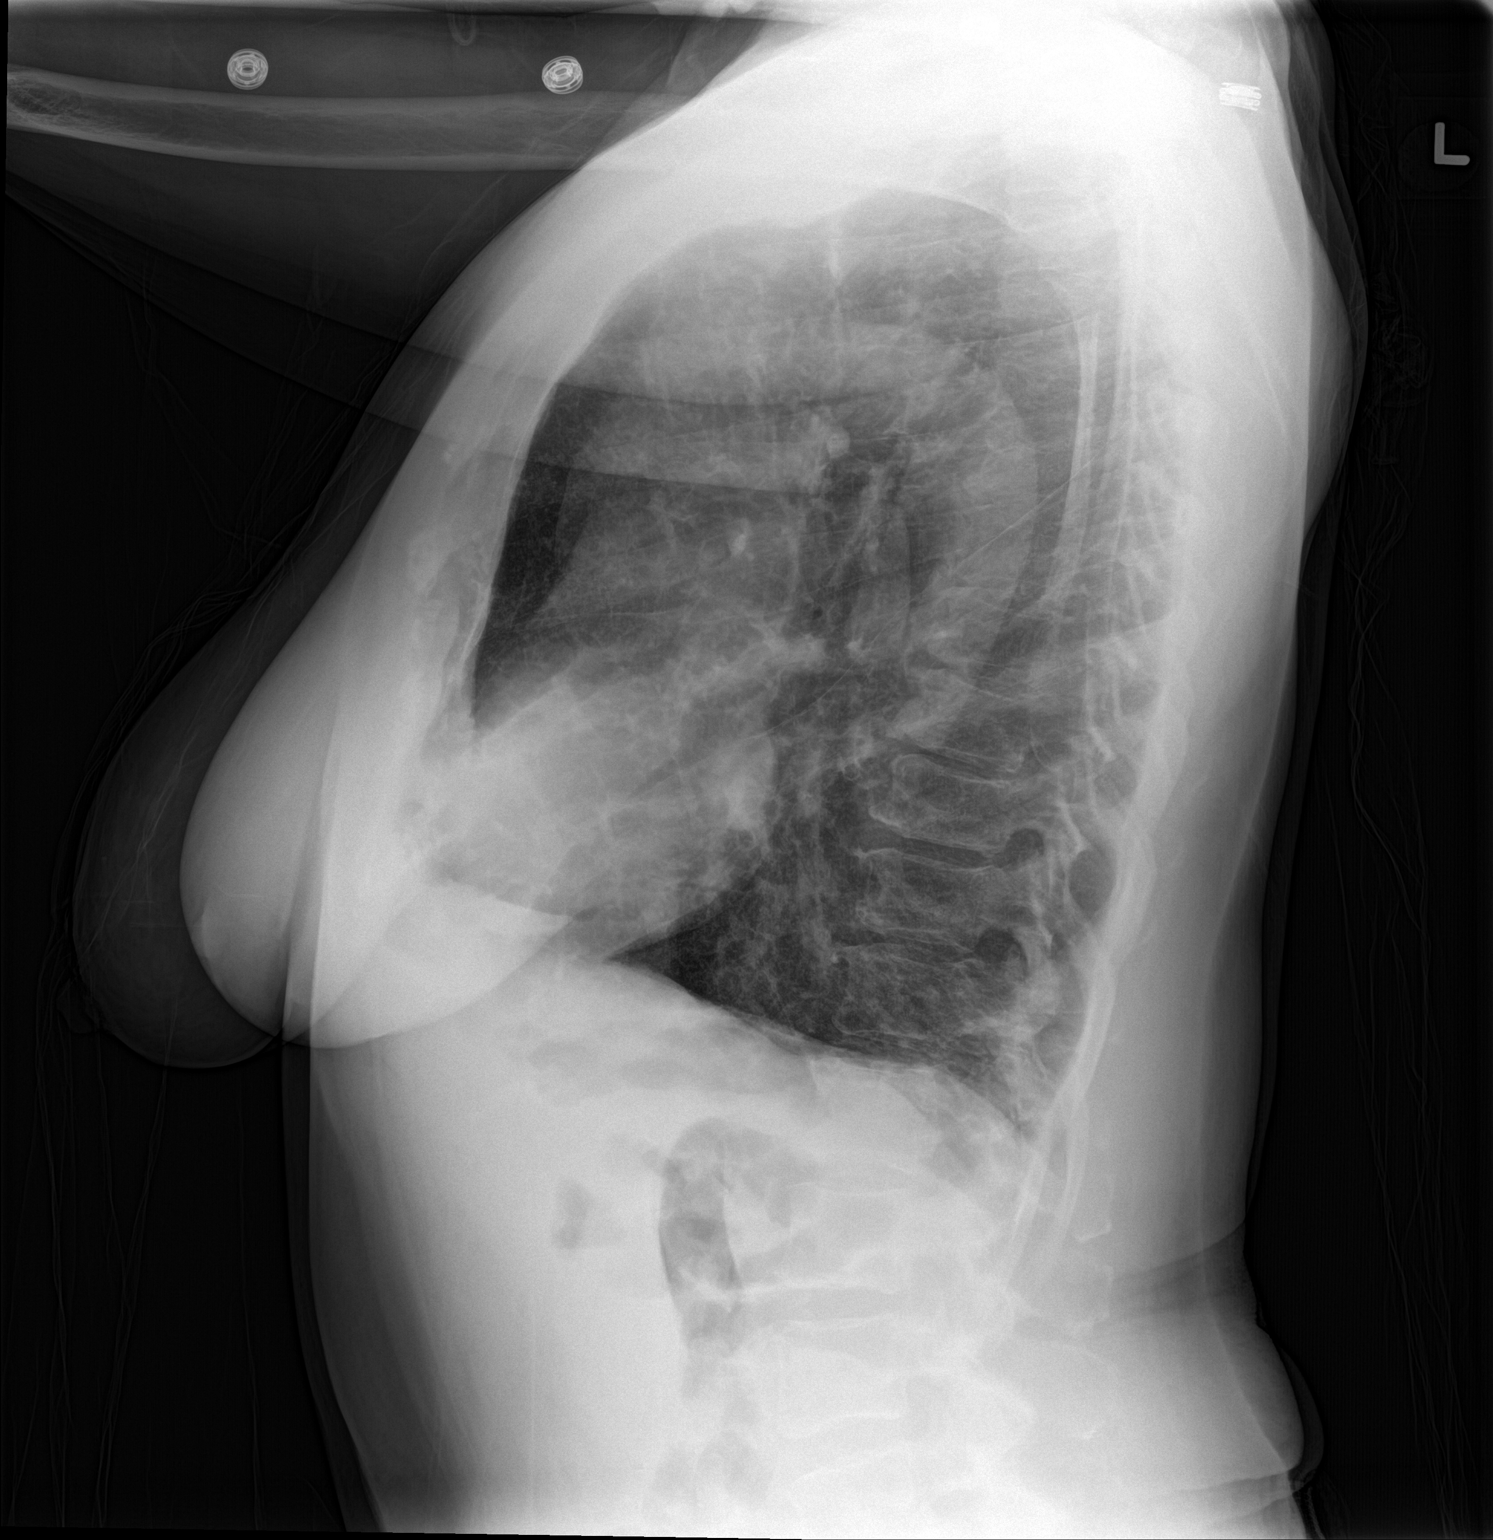

[2 of 2 positions shown; findings below may reference images not displayed]

FINDINGS: There is an ill-defined density in the medial aspect of the right
middle lobe which probably represents pneumonia. I recommend
follow-up to exclude a mass.

The patient also has new bilateral Kerley B-lines at the lung bases
consistent with interstitial edema. The lungs are hyperinflated
consistent with emphysema. Heart size is normal. No effusions. No
acute osseous abnormality. Slight thoracolumbar scoliosis.
IMPRESSION: 1. Probable pneumonia in the medial aspect of the right middle lobe.
2. Interstitial edema at the lung bases.
3. Emphysema.
4. Follow-up chest x-ray is recommended to ensure clearing of the
right middle lobe when to exclude a mass at that site.

## 2015-10-28 MED ORDER — GUAIFENESIN-CODEINE 100-10 MG/5ML PO SOLN: 10.0000 mL | Freq: Three times a day (TID) | ORAL | Status: DC | PRN

## 2015-10-28 MED ORDER — LEVOFLOXACIN 500 MG PO TABS: 500.0000 mg | ORAL_TABLET | Freq: Every day | ORAL | Status: DC

## 2015-10-28 MED ORDER — LEVOFLOXACIN 500 MG PO TABS
500.0000 mg | ORAL_TABLET | Freq: Once | ORAL | Status: AC
  Administered 2015-10-28: 500 mg via ORAL
  Filled 2015-10-28: qty 1

## 2015-10-28 NOTE — ED Notes (Signed)
Patient transported to X-ray 

## 2015-10-28 NOTE — Discharge Instructions (Signed)

## 2015-10-28 NOTE — ED Notes (Signed)
Pt here from home with c/o low grade fever and coughing up some green sputum , pt states that she feels like she did with PNA

## 2015-11-10 NOTE — ED Provider Notes (Signed)
CSN: [id number]     Arrival date & time 10/28/15  0930 History   First MD Initiated Contact with Patient 10/28/15 [phone]     Chief Complaint  Patient presents with  . Fever     (Consider location/radiation/quality/duration/timing/severity/associated sxs/prior Treatment) HPI   70 year old female with fever and cough. Symptom onset a few days ago and persistent since then. Greenish sputum. No fevers or chills. Denies any pain. Patient states that she feels like she did with previous pneumonia. Patient is a smoker. None home oxygen usage. No unusual leg pain or swelling. No n/v.   Past Medical History  Diagnosis Date  . Hypertension    Past Surgical History  Procedure Laterality Date  . Tonsillectomy and adenoidectomy     Family History  Problem Relation Age of Onset  . Colon cancer Neg Hx   . Pancreatic cancer Neg Hx   . Stomach cancer Neg Hx    Social History  Substance Use Topics  . Smoking status: Current Every Day Smoker -- 0.50 packs/day  . Smokeless tobacco: Never Used  . Alcohol Use: 4.2 oz/week    7 Glasses of wine per week   OB History    No data available     Review of Systems  All systems reviewed and negative, other than as noted in HPI.   Allergies  Review of patient's allergies indicates no known allergies.  Home Medications   Prior to Admission medications   Medication Sig Start Date End Date Taking? Authorizing Provider  BIOTIN PO Take 1 tablet by mouth daily.   Yes Historical Provider, MD  hydrochlorothiazide (HYDRODIURIL) 25 MG tablet Take 12.5 mg by mouth daily.    Yes Historical Provider, MD  KLOR-CON M20 20 MEQ tablet Take 10 mEq by mouth daily.  03/17/14  Yes Historical Provider, MD  Multiple Vitamins-Minerals (MULTIVITAMIN WITH MINERALS) tablet Take 1 tablet by mouth daily.   Yes Historical Provider, MD  Omega-3 Fatty Acids (FISH OIL) 1000 MG CAPS Take 1 capsule by mouth daily.   Yes Historical Provider, MD  guaiFENesin-codeine 100-10 MG/5ML  syrup Take 10 mLs by mouth 3 (three) times daily as needed for cough. 10/28/15   Virgel Manifold, MD  levofloxacin (LEVAQUIN) 500 MG tablet Take 1 tablet (500 mg total) by mouth daily. 10/28/15   Virgel Manifold, MD   BP 160/98 mmHg  Pulse 76  Temp(Src) 99.3 F (37.4 C) (Oral)  Resp 18  SpO2 94% Physical Exam  Constitutional: She appears well-developed and well-nourished. No distress.  HENT:  Head: Normocephalic and atraumatic.  Eyes: Conjunctivae are normal. Right eye exhibits no discharge. Left eye exhibits no discharge.  Neck: Neck supple.  Cardiovascular: Normal rate, regular rhythm and normal heart sounds.  Exam reveals no gallop and no friction rub.   No murmur heard. Pulmonary/Chest: Effort normal. No respiratory distress. She has wheezes.  Abdominal: Soft. She exhibits no distension. There is no tenderness.  Musculoskeletal: She exhibits no edema or tenderness.  Lower extremities symmetric as compared to each other. No calf tenderness. Negative Homan's. No palpable cords.   Neurological: She is alert.  Skin: Skin is warm and dry.  Psychiatric: She has a normal mood and affect. Her behavior is normal. Thought content normal.  Nursing note and vitals reviewed.   ED Course  Procedures (including critical care time) Labs Review Labs Reviewed - No data to display  Imaging Review  Dg Chest 2 View  10/28/2015  CLINICAL DATA:  Productive cough with chills and fever.  EXAM: CHEST  2 VIEW COMPARISON:  04/06/2014 FINDINGS: There is an ill-defined density in the medial aspect of the right middle lobe which probably represents pneumonia. I recommend follow-up to exclude a mass. The patient also has new bilateral Kerley B-lines at the lung bases consistent with interstitial edema. The lungs are hyperinflated consistent with emphysema. Heart size is normal. No effusions. No acute osseous abnormality. Slight thoracolumbar scoliosis. IMPRESSION: 1. Probable pneumonia in the medial aspect of  the right middle lobe. 2. Interstitial edema at the lung bases. 3. Emphysema. 4. Follow-up chest x-ray is recommended to ensure clearing of the right middle lobe when to exclude a mass at that site. Electronically Signed   By: Lorriane Shire M.D.   On: 10/28/2015 10:59   No results found. I have personally reviewed and evaluated these images and lab results as part of my medical decision-making.   EKG Interpretation None      MDM   Final diagnoses:  CAP (community acquired pneumonia)    70 year old female with productive cough and fever. Dissection was consistent with right-sided pneumonia. She generally appears well. I feel she is peripheral outpatient treatment. Return precautions were discussed.    Virgel Manifold, MD 11/10/15 1242

## 2016-08-09 ENCOUNTER — Encounter: Payer: Self-pay | Admitting: Gastroenterology

## 2016-09-12 ENCOUNTER — Other Ambulatory Visit: Payer: Self-pay | Admitting: Nephrology

## 2016-09-12 DIAGNOSIS — N184 Chronic kidney disease, stage 4 (severe): Secondary | ICD-10-CM

## 2016-09-18 ENCOUNTER — Encounter: Payer: Self-pay | Admitting: Gastroenterology

## 2016-09-24 ENCOUNTER — Other Ambulatory Visit: Payer: Self-pay | Admitting: Physician Assistant

## 2016-09-24 DIAGNOSIS — Z1231 Encounter for screening mammogram for malignant neoplasm of breast: Secondary | ICD-10-CM

## 2016-10-04 ENCOUNTER — Ambulatory Visit
Admission: RE | Admit: 2016-10-04 | Discharge: 2016-10-04 | Disposition: A | Discharge status: Home / Self Care | Payer: Medicare Other | Source: Ambulatory Visit | Attending: Nephrology | Admitting: Nephrology

## 2016-10-04 DIAGNOSIS — N184 Chronic kidney disease, stage 4 (severe): Secondary | ICD-10-CM

## 2016-10-04 IMAGING — US US RENAL
1 series · 14 of 25 positions shown · non-contrast
Comparison: None.

CLINICAL DATA: Chronic kidney disease stage 4. History of
hypertension.

EXAM:
RENAL / URINARY TRACT ULTRASOUND COMPLETE

[Series 1: us renal · 0.24mm/px · 14 of 37 slices shown]
[im 1/37]
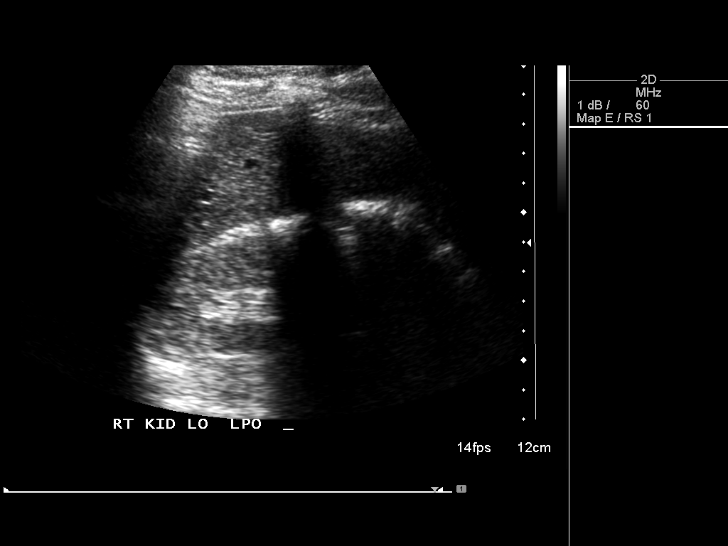
[im 4/37]
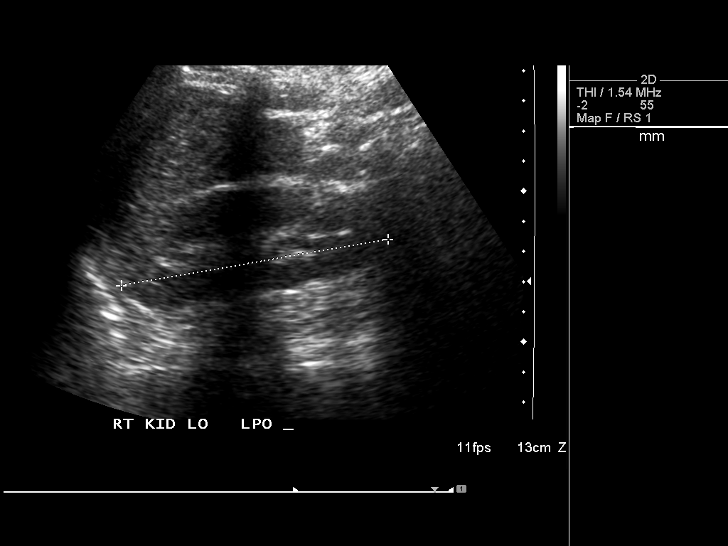
[im 7/37]
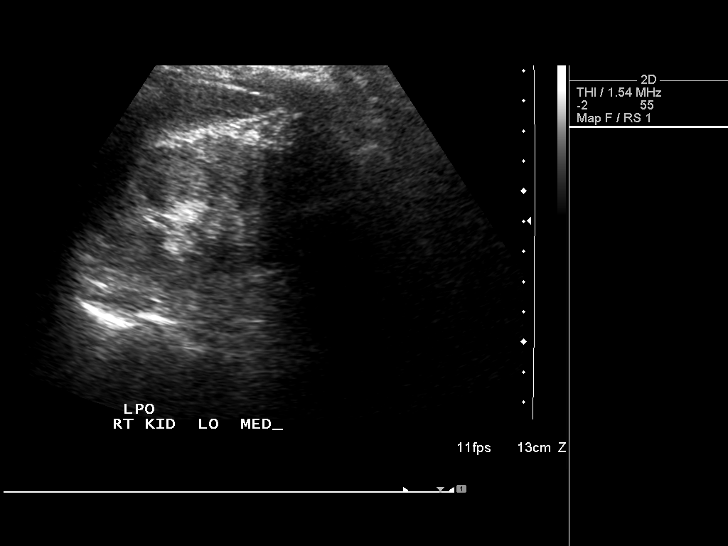
[im 10/37]
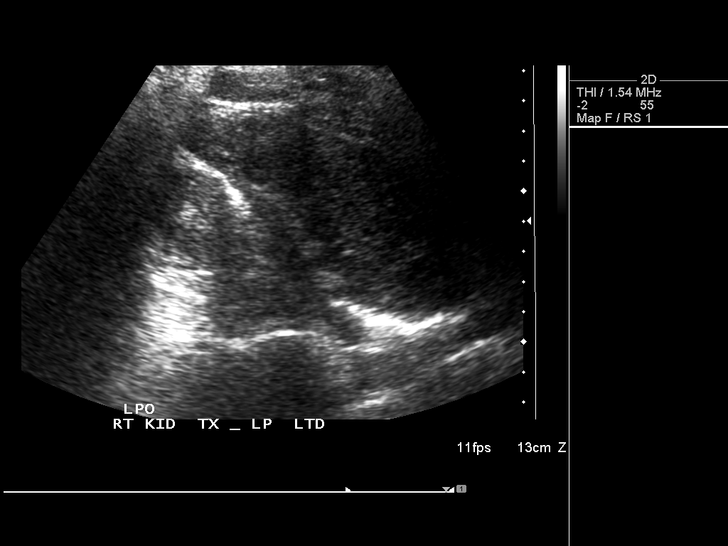
[im 13/37]
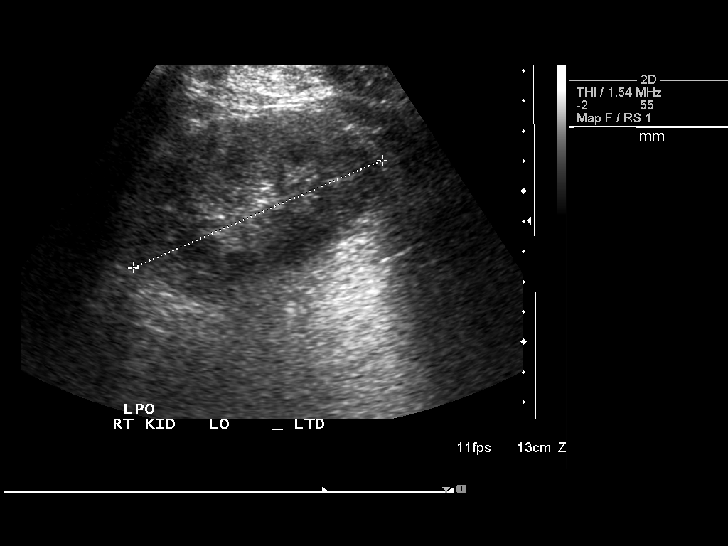
[im 14/37]
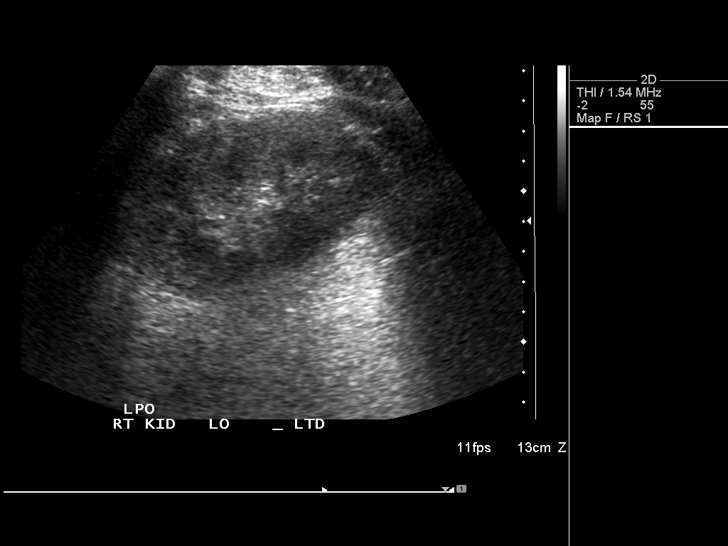
[im 17/37]
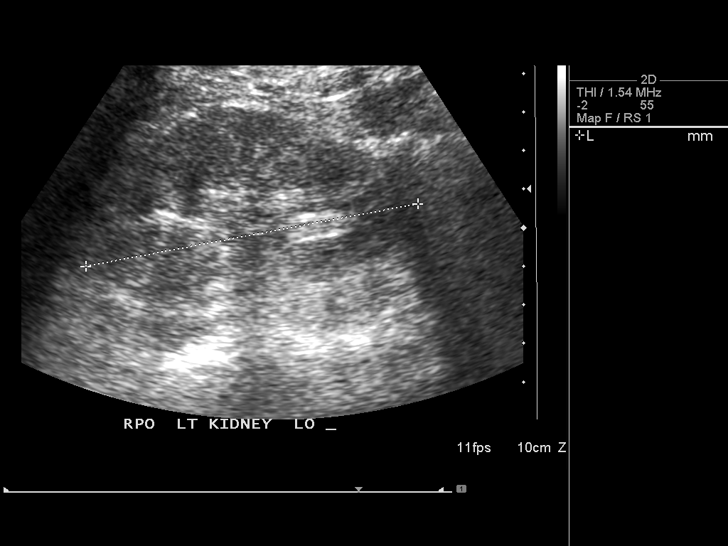
[im 20/37]
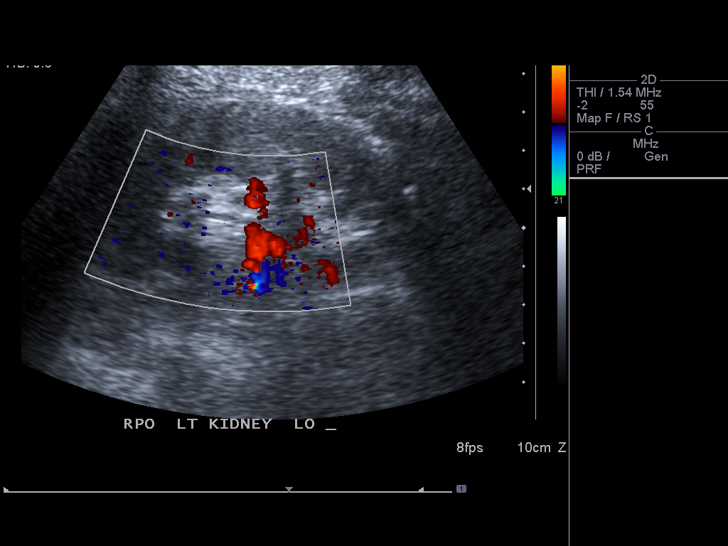
[im 23/37]
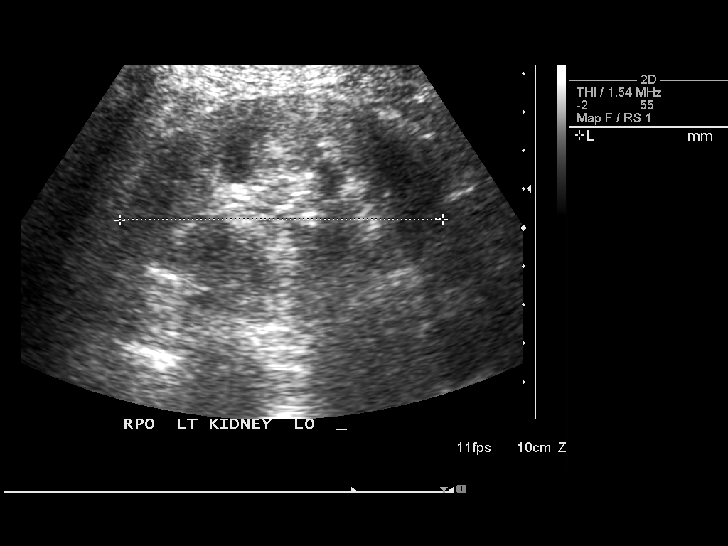
[im 25/37]
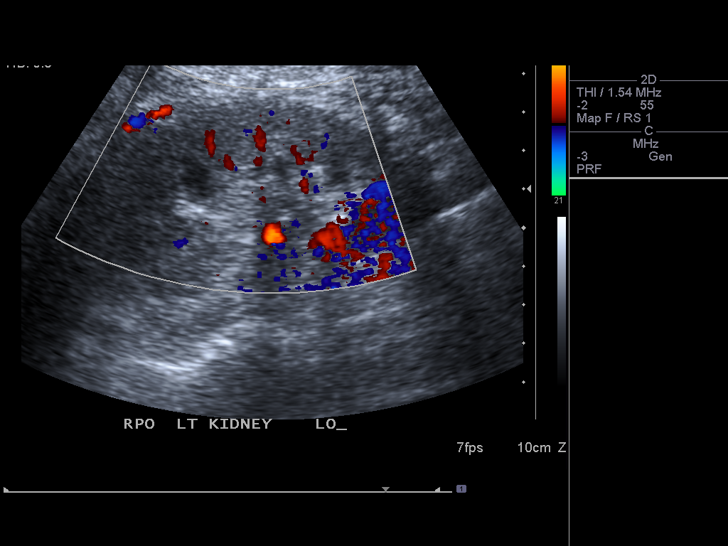
[im 28/37]
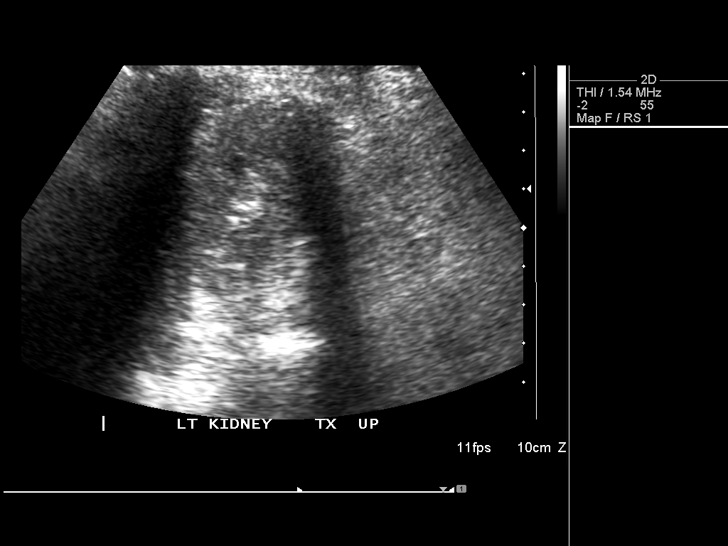
[im 31/37]
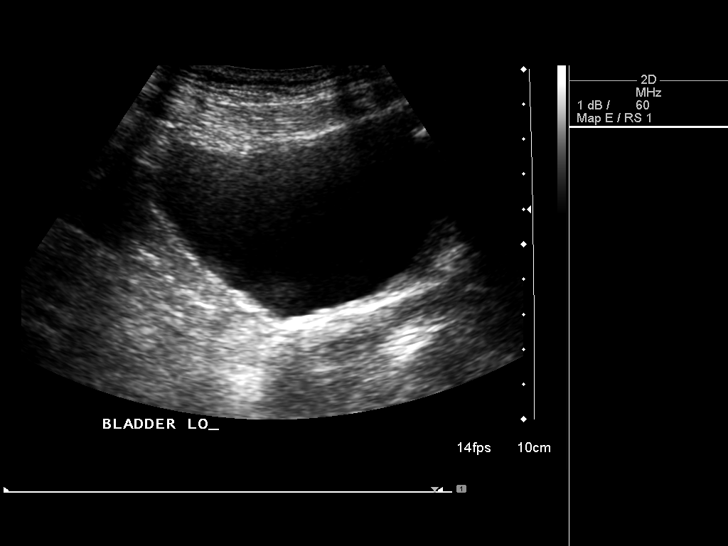
[im 34/37]
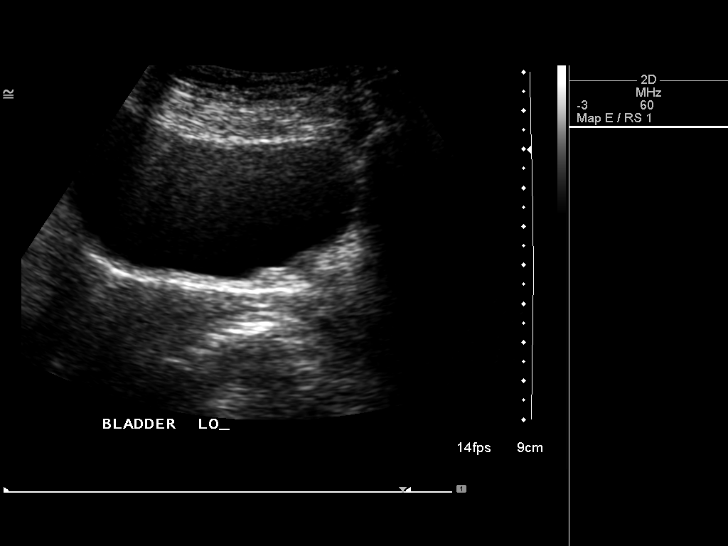
[im 37/37]
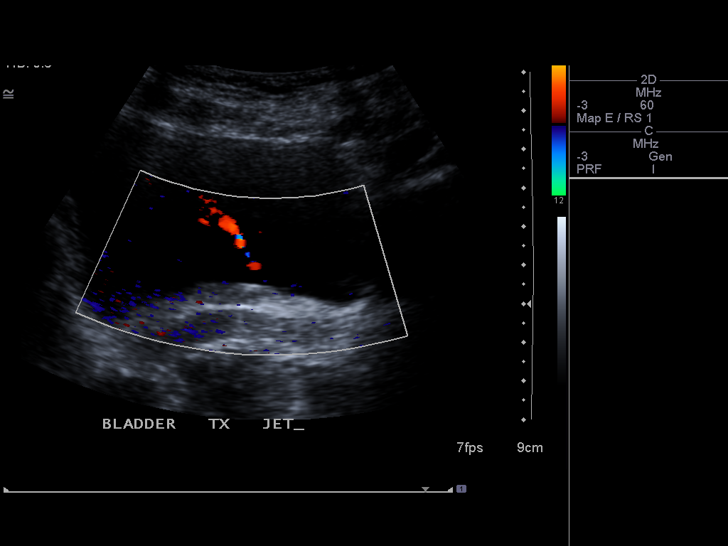

[14 of 25 positions shown; findings below may reference images not displayed]

FINDINGS: Right Kidney:

Length: 9.0 cm. Echogenicity is normal. No hydronephrosis. No renal
mass. Visualization of the right kidney is slightly limited by
overlying bowel gas.

Left Kidney:

Length: 8.8 cm. Echogenicity appears normal. No hydronephrosis or
focal renal mass. Visualization of the left kidney is slightly
limited by overlying bowel gas.

Bladder:

Appears normal for degree of bladder distention. Bilateral ureteral
jets are present.
IMPRESSION: 1. No hydronephrosis.
2. Both kidneys are slightly diminutive without focal masses
identified.

## 2016-10-31 ENCOUNTER — Ambulatory Visit
Admission: RE | Admit: 2016-10-31 | Discharge: 2016-10-31 | Disposition: A | Discharge status: Home / Self Care | Payer: Medicare Other | Source: Ambulatory Visit | Attending: Physician Assistant | Admitting: Physician Assistant

## 2016-10-31 DIAGNOSIS — Z1231 Encounter for screening mammogram for malignant neoplasm of breast: Secondary | ICD-10-CM

## 2016-11-11 ENCOUNTER — Ambulatory Visit (AMBULATORY_SURGERY_CENTER): Payer: Self-pay | Admitting: *Deleted

## 2016-11-11 VITALS — Ht 64.0 in | Wt 125.0 lb

## 2016-11-11 DIAGNOSIS — Z86010 Personal history of colonic polyps: Secondary | ICD-10-CM

## 2016-11-11 MED ORDER — NA SULFATE-K SULFATE-MG SULF 17.5-3.13-1.6 GM/177ML PO SOLN: ORAL | 0 refills | Status: DC

## 2016-11-11 NOTE — Progress Notes (Signed)
No egg or soy allergy  No anesthesia problems per pt.  Has never been intubated; no trouble moving neck  No home oxygen used of hx of sleep apnea  No diet medications taken

## 2016-11-18 ENCOUNTER — Telehealth: Payer: Self-pay | Admitting: Gastroenterology

## 2016-11-19 ENCOUNTER — Encounter: Payer: Medicare Other | Admitting: Gastroenterology

## 2017-08-03 IMAGING — US US RENAL
1 series · 14 of 25 positions shown · non-contrast
Comparison: [DATE]

CLINICAL DATA: Stage IV chronic kidney disease

EXAM:
RENAL / URINARY TRACT ULTRASOUND COMPLETE

[Series 1: us renal · 0.22mm/px · 14 of 40 slices shown]
[im 1/40]
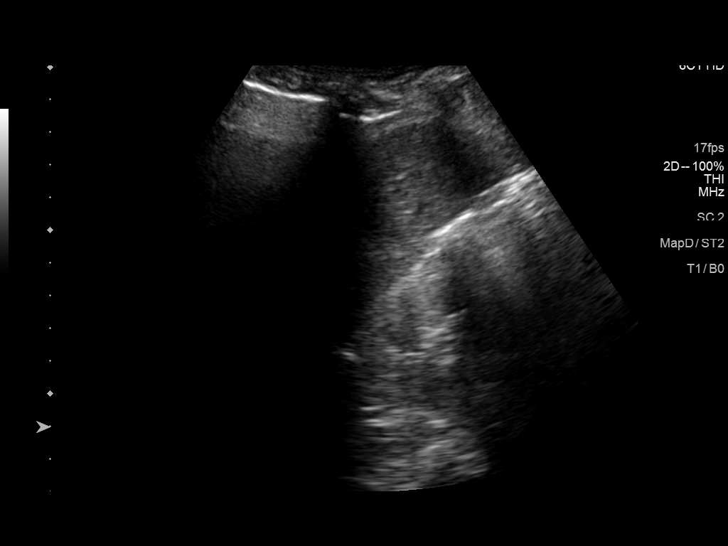
[im 4/40]
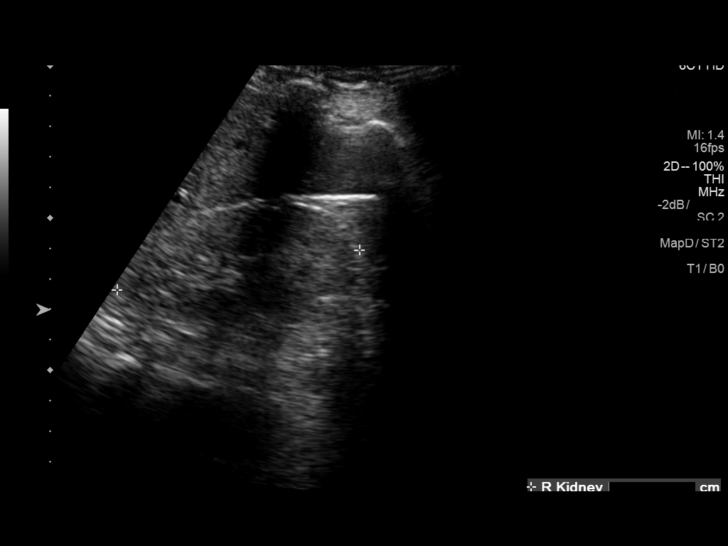
[im 7/40]
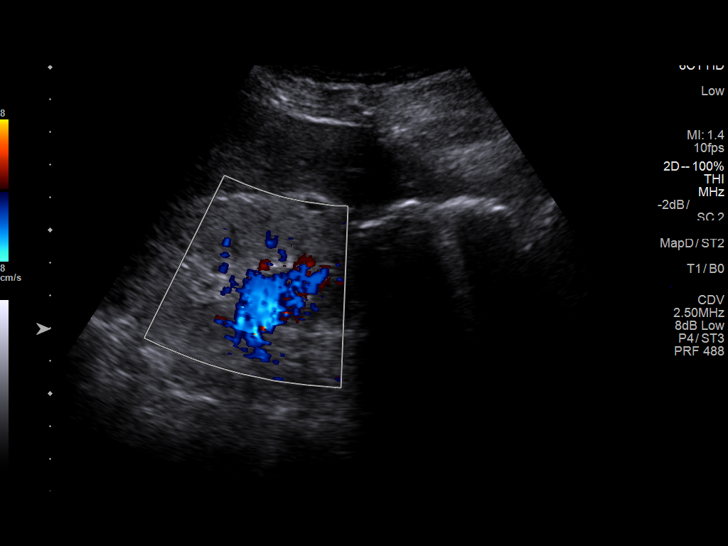
[im 10/40]
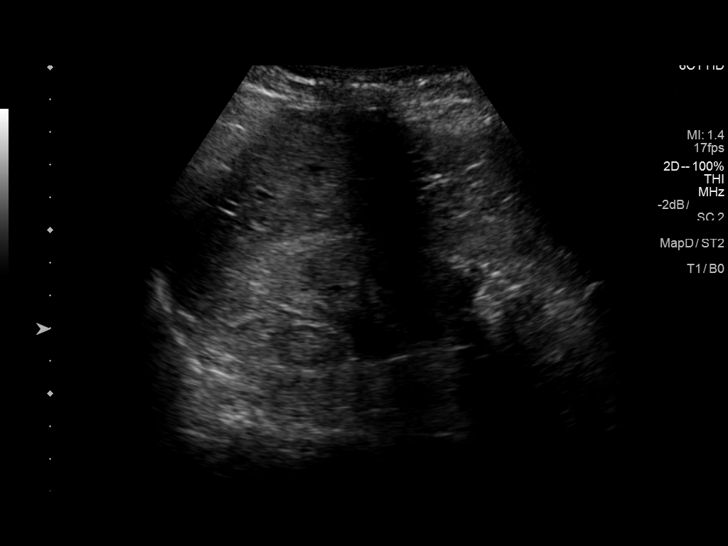
[im 14/40]
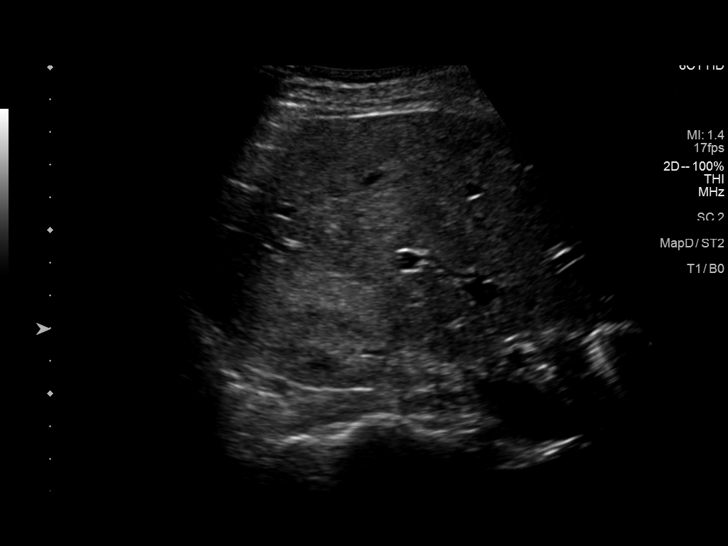
[im 15/40]
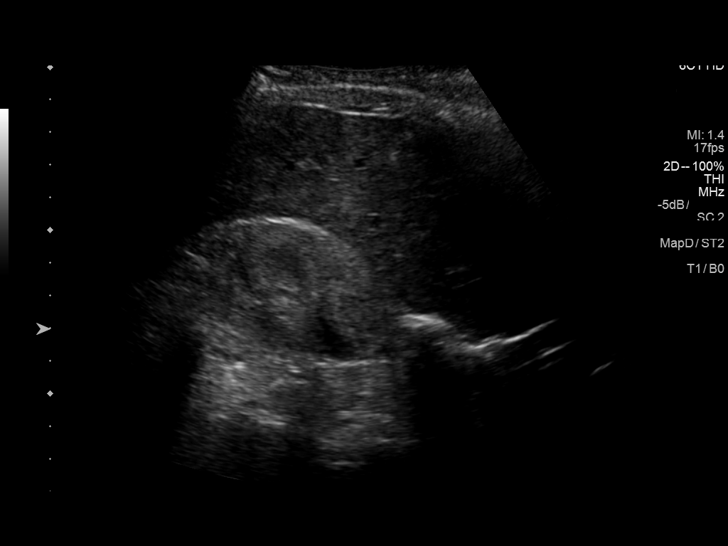
[im 18/40]
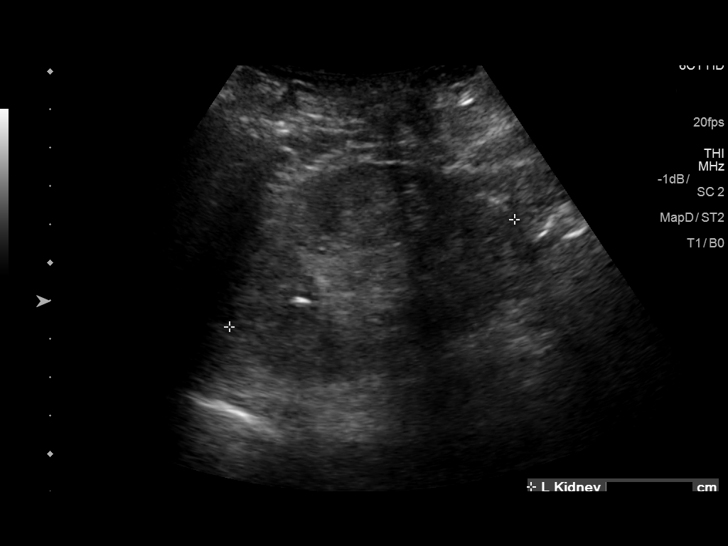
[im 22/40]
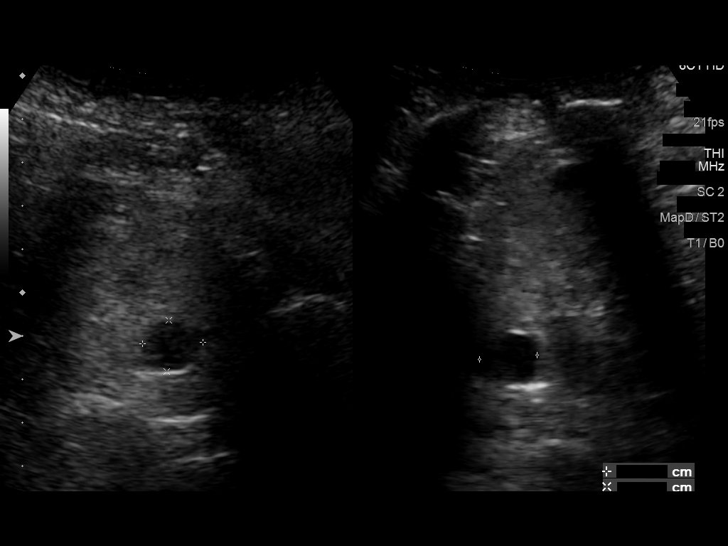
[im 25/40]
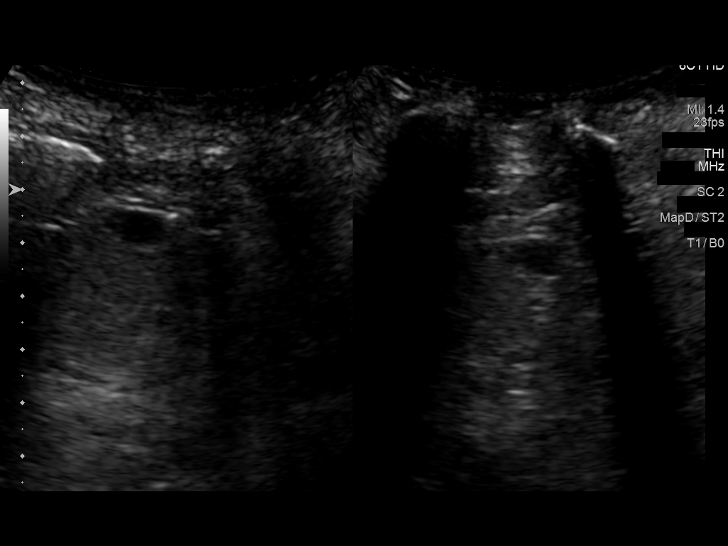
[im 27/40]
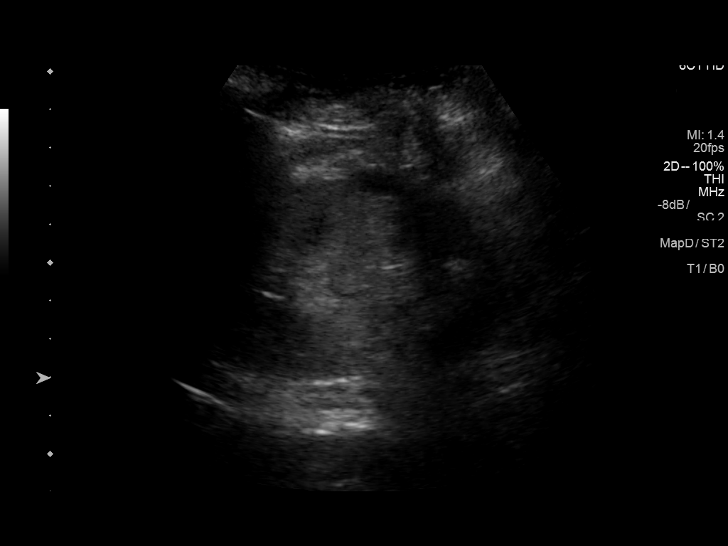
[im 30/40]
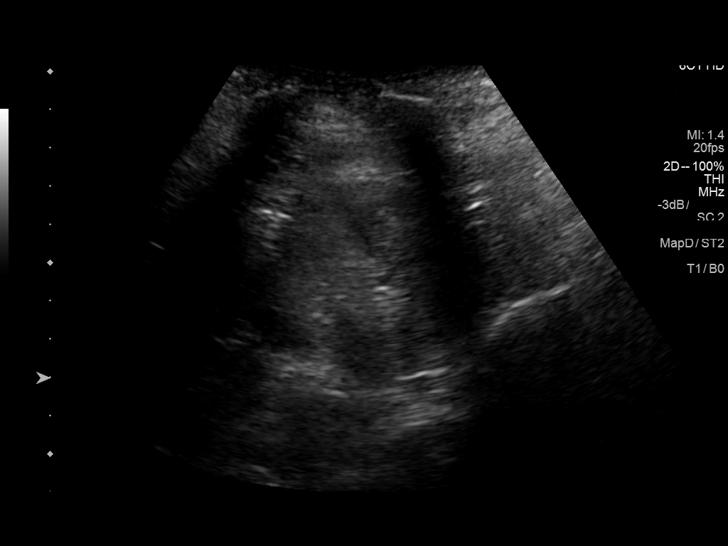
[im 33/40]
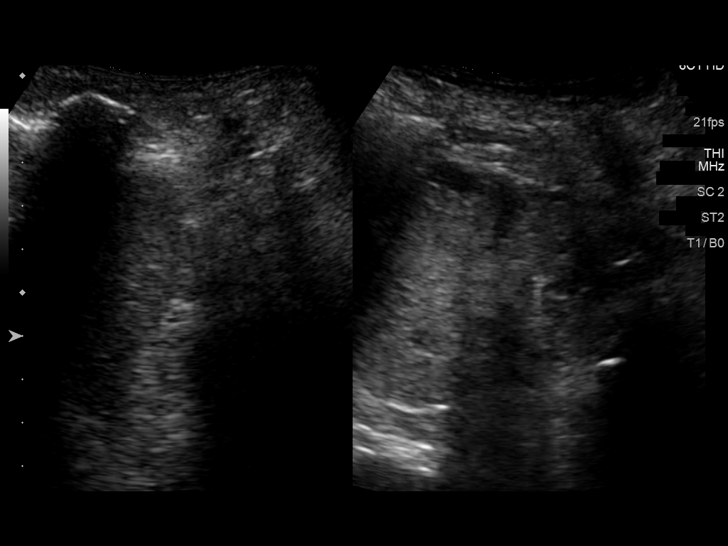
[im 36/40]
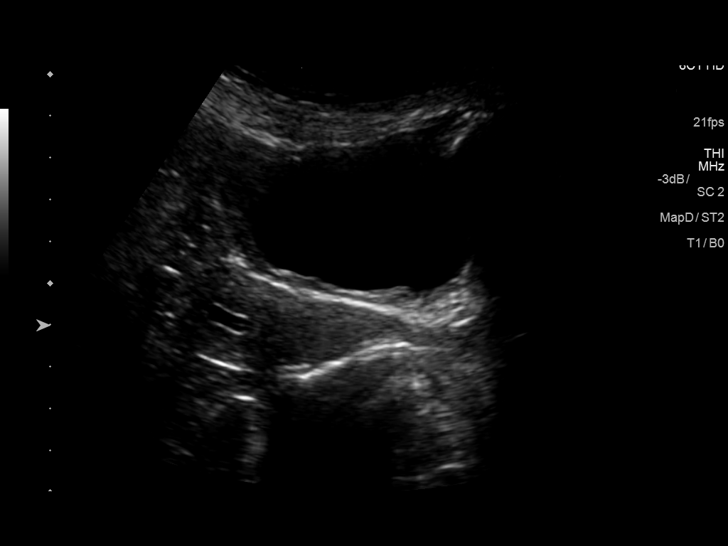
[im 40/40]
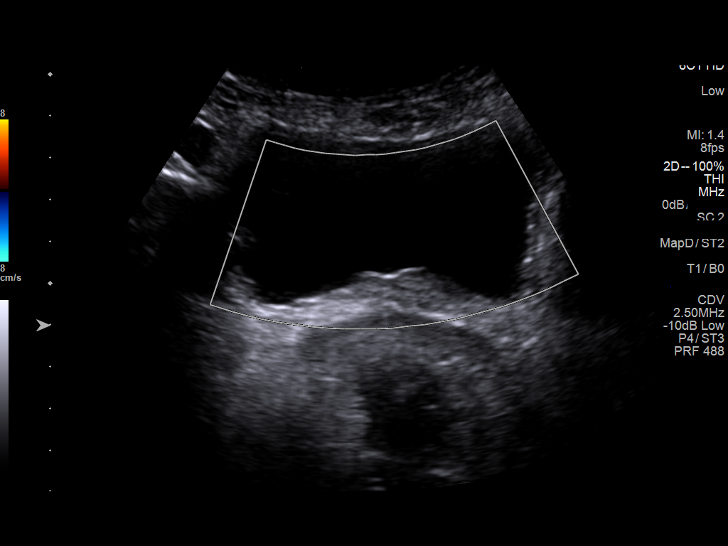

[14 of 25 positions shown; findings below may reference images not displayed]

FINDINGS: Right Kidney:

Length: 8.3 cm. Small size. Normal cortical thickness. Increased
cortical echogenicity. No mass, hydronephrosis or shadowing
calcification.

Left Kidney:

Length: 8.3 cm. Small size. Normal cortical thickness. Increased
cortical echogenicity. No hydronephrosis or shadowing calcification.
Two small cysts are identified at the lower pole, 14 x 12 x 13 mm
and 12 x 7 x 11 mm, both demonstrating a few scattered internal
echoes.

Bladder:

Appears normal for degree of bladder distention. LEFT ureteral jet
was visualized. No focal uterine mass.
IMPRESSION: Small kidneys with increased cortical echogenicity consistent with
medical renal disease.

Small cyst inferior pole LEFT kidney.

## 2018-02-17 ENCOUNTER — Other Ambulatory Visit: Payer: Self-pay | Admitting: Nephrology

## 2018-02-17 DIAGNOSIS — N184 Chronic kidney disease, stage 4 (severe): Secondary | ICD-10-CM

## 2018-02-18 ENCOUNTER — Ambulatory Visit
Admission: RE | Admit: 2018-02-18 | Discharge: 2018-02-18 | Disposition: A | Discharge status: Home / Self Care | Payer: Medicare Other | Source: Ambulatory Visit | Attending: Nephrology | Admitting: Nephrology

## 2018-02-18 DIAGNOSIS — N184 Chronic kidney disease, stage 4 (severe): Secondary | ICD-10-CM

## 2018-03-04 ENCOUNTER — Other Ambulatory Visit: Payer: Self-pay

## 2018-03-04 DIAGNOSIS — N185 Chronic kidney disease, stage 5: Secondary | ICD-10-CM

## 2018-03-09 ENCOUNTER — Other Ambulatory Visit (HOSPITAL_COMMUNITY): Payer: Self-pay

## 2018-03-10 ENCOUNTER — Ambulatory Visit (HOSPITAL_COMMUNITY)
Admission: RE | Admit: 2018-03-10 | Discharge: 2018-03-10 | Disposition: A | Discharge status: Home / Self Care | Payer: Medicare Other | Source: Ambulatory Visit | Attending: Nephrology | Admitting: Nephrology

## 2018-03-10 DIAGNOSIS — D631 Anemia in chronic kidney disease: Secondary | ICD-10-CM | POA: Diagnosis present

## 2018-03-10 DIAGNOSIS — N189 Chronic kidney disease, unspecified: Secondary | ICD-10-CM | POA: Insufficient documentation

## 2018-03-10 MED ORDER — SODIUM CHLORIDE 0.9 % IV SOLN
510.0000 mg | INTRAVENOUS | Status: DC
  Administered 2018-03-10: 510 mg via INTRAVENOUS
  Filled 2018-03-10: qty 17

## 2018-03-10 NOTE — Discharge Instructions (Signed)

## 2018-03-17 ENCOUNTER — Ambulatory Visit (HOSPITAL_COMMUNITY)
Admission: RE | Admit: 2018-03-17 | Discharge: 2018-03-17 | Disposition: A | Discharge status: Home / Self Care | Payer: Medicare Other | Source: Ambulatory Visit | Attending: Nephrology | Admitting: Nephrology

## 2018-03-17 DIAGNOSIS — D631 Anemia in chronic kidney disease: Secondary | ICD-10-CM | POA: Insufficient documentation

## 2018-03-17 DIAGNOSIS — N189 Chronic kidney disease, unspecified: Secondary | ICD-10-CM | POA: Insufficient documentation

## 2018-03-17 MED ORDER — SODIUM CHLORIDE 0.9 % IV SOLN
510.0000 mg | INTRAVENOUS | Status: DC
  Administered 2018-03-17: 510 mg via INTRAVENOUS
  Filled 2018-03-17: qty 17

## 2018-03-30 ENCOUNTER — Ambulatory Visit (HOSPITAL_COMMUNITY)
Admission: RE | Admit: 2018-03-30 | Discharge: 2018-03-30 | Disposition: A | Discharge status: Home / Self Care | Payer: Medicare Other | Source: Ambulatory Visit | Attending: Surgery | Admitting: Surgery

## 2018-03-30 ENCOUNTER — Encounter: Payer: Self-pay | Admitting: Surgery

## 2018-03-30 ENCOUNTER — Ambulatory Visit (INDEPENDENT_AMBULATORY_CARE_PROVIDER_SITE_OTHER): Payer: Medicare Other | Admitting: Surgery

## 2018-03-30 ENCOUNTER — Ambulatory Visit (INDEPENDENT_AMBULATORY_CARE_PROVIDER_SITE_OTHER)
Admission: RE | Admit: 2018-03-30 | Discharge: 2018-03-30 | Disposition: A | Discharge status: Home / Self Care | Payer: Medicare Other | Source: Ambulatory Visit | Attending: Surgery | Admitting: Surgery

## 2018-03-30 VITALS — BP 171/94 | HR 57 | Temp 98.0°F | Resp 18 | Ht 64.0 in | Wt 108.8 lb

## 2018-03-30 DIAGNOSIS — N185 Chronic kidney disease, stage 5: Secondary | ICD-10-CM | POA: Diagnosis present

## 2018-03-30 DIAGNOSIS — Z01818 Encounter for other preprocedural examination: Secondary | ICD-10-CM | POA: Insufficient documentation

## 2018-03-30 NOTE — Progress Notes (Signed)
[facility]  Patient name: Bethany [name] MRN: [id number] DOB: 04/18/1946 Sex: female   REQUESTING PROVIDER:    Dr. Florene Glen   REASON FOR CONSULT:    CKD 5   HISTORY OF PRESENT ILLNESS:   Bethany [name] is a 72 y.o. female, who is referred for dialysis access.  She is ambidextrous but uses her right hand the most.  Her renal failure is secondary to long-standing history of hypertension.  She has hepatitis C positive.  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  The patient has had some leg swelling in the past but this is resolved.  She denies any shortness of breath.  PAST MEDICAL HISTORY    Past Medical History:  Diagnosis Date  . Anemia   . Heart murmur    as a child  . Hyperlipidemia    no meds aken  . Hypertension      FAMILY HISTORY   Family History  Problem Relation Age of Onset  . Colon cancer Neg Hx   . Pancreatic cancer Neg Hx   . Stomach cancer Neg Hx   . Esophageal cancer Neg Hx   . Rectal cancer Neg Hx     SOCIAL HISTORY:   Social History   Socioeconomic History  . Marital status: Single    Spouse name: Not on file  . Number of children: Not on file  . Years of education: Not on file  . Highest education level: Not on file  Occupational History  . Not on file  Social Needs  . Financial resource strain: Not on file  . Food insecurity:    Worry: Not on file    Inability: Not on file  . Transportation needs:    Medical: Not on file    Non-medical: Not on file  Tobacco Use  . Smoking status: Current Every Day Smoker    Packs/day: 0.50  . Smokeless tobacco: Never Used  Substance and Sexual Activity  . Alcohol use: Yes    Alcohol/week: 4.2 oz    Types: 7 Glasses of wine per week  . Drug use: No  . Sexual activity: Not on file  Lifestyle  . Physical activity:    Days per week: Not on file    Minutes per session: Not on file  . Stress: Not on file  Relationships    . Social connections:    Talks on phone: Not on file    Gets together: Not on file    Attends religious service: Not on file    Active member of club or organization: Not on file    Attends meetings of clubs or organizations: Not on file    Relationship status: Not on file  . Intimate partner violence:    Fear of current or ex partner: Not on file    Emotionally abused: Not on file    Physically abused: Not on file    Forced sexual activity: Not on file  Other Topics Concern  . Not on file  Social History Narrative  . Not on file    ALLERGIES:    No Known Allergies  CURRENT MEDICATIONS:    Current Outpatient Medications  Medication Sig Dispense Refill  . Amino Acids (DAILY AMINO 6000 PO) Take by mouth daily.    Marland Kitchen AMLODIPINE BESYLATE PO Take 5 mg by mouth daily.    Marland Kitchen atenolol (TENORMIN) 25 MG tablet Take 12.5 mg by mouth daily.    Marland Kitchen  BIOTIN PO Take by mouth daily.    . calcitRIOL (ROCALTROL) 0.5 MCG capsule Take 0.5 mcg by mouth daily.  5  . LUTEIN PO Take by mouth daily.    . Multiple Vitamins-Minerals (MULTIVITAMIN WITH MINERALS) tablet Take 1 tablet by mouth daily.    . Na Sulfate-K Sulfate-Mg Sulf (SUPREP BOWEL PREP KIT) 17.5-3.13-1.6 GM/180ML SOLN Suprep as directed, no substitutions 354 mL 0  . Omega-3 Fatty Acids (FISH OIL) 1000 MG CAPS Take 1 capsule by mouth daily.    Marland Kitchen VITAMIN A PO Apply to eye daily.    Marland Kitchen VITAMIN E PO Take by mouth daily.     No current facility-administered medications for this visit.     REVIEW OF SYSTEMS:   [X]  denotes positive finding, [ ]  denotes negative finding Cardiac  Comments:  Chest pain or chest pressure:    Shortness of breath upon exertion:    Short of breath when lying flat:    Irregular heart rhythm:        Vascular    Pain in calf, thigh, or hip brought on by ambulation:    Pain in feet at night that wakes you up from your sleep:     Blood clot in your veins:    Leg swelling:  x       Pulmonary    Oxygen at home:     Productive cough:     Wheezing:         Neurologic    Sudden weakness in arms or legs:     Sudden numbness in arms or legs:     Sudden onset of difficulty speaking or slurred speech:    Temporary loss of vision in one eye:     Problems with dizziness:         Gastrointestinal    Blood in stool:      Vomited blood:         Genitourinary    Burning when urinating:     Blood in urine:        Psychiatric    Major depression:         Hematologic    Bleeding problems:    Problems with blood clotting too easily:        Skin    Rashes or ulcers:        Constitutional    Fever or chills:     PHYSICAL EXAM:   Vitals:   03/30/18 0847 03/30/18 0849  BP: (!) 171/94 (!) 171/94  Pulse: (!) 57   Resp: 18   Temp: 98 F (36.7 C)   TempSrc: Oral   SpO2: 98%   Weight: 108 lb 12.8 oz (49.4 kg)   Height: 5' 4"  (1.626 m)     GENERAL: The patient is a well-nourished female, in no acute distress. The vital signs are documented above. CARDIAC: There is a regular rate and rhythm.  VASCULAR: Palpable left brachial and radial pulse PULMONARY: Nonlabored respirations I have ordered and reviewed her MUSCULOSKELETAL: There are no major deformities or cyanosis. NEUROLOGIC: No focal weakness or paresthesias are detected. SKIN: There are no ulcers or rashes noted. PSYCHIATRIC: The patient has a normal affect.  STUDIES:   Vein mapping studies and arterial duplex.  Her arterial duplex was normal.  She does not appear to have adequate cephalic or basilic vein in either extremity.  ASSESSMENT and PLAN   CKD 5: I discussed with the patient that I do not feel she has an adequate  vein for fistula creation.  She is going to require a graft.  I suspect because of the size of her arm that she is going to need a left upper arm graft.  She is hep C positive.  She is scheduled to see Dr. Florene Glen on June 17.  I told her to contact our office after this visit if she needs to have her graft  placed.   Annamarie Major, MD [facility] [name] [phone] Pager [phone]

## 2018-03-31 ENCOUNTER — Encounter: Payer: Self-pay | Admitting: Nephrology

## 2018-04-15 ENCOUNTER — Telehealth: Payer: Self-pay | Admitting: *Deleted

## 2018-04-15 ENCOUNTER — Other Ambulatory Visit: Payer: Self-pay | Admitting: *Deleted

## 2018-04-15 NOTE — Progress Notes (Signed)
Left message for patient to call back for surgery details/ pre-op instructions.

## 2018-04-15 NOTE — Telephone Encounter (Signed)
Procedure date changed per patient. Instructed to be at [facility] admitting at 6:30 am on 04/29/18 for procedure. NPO past MN night prior except am with sips of water as instructed by hospital pre-admission department.Must have a driver for home. Expect a call and follow the detailed instructions received from the hospital  pre-admission department. Verbalized understanding.

## 2018-04-22 ENCOUNTER — Telehealth: Payer: Self-pay | Admitting: *Deleted

## 2018-04-22 NOTE — Telephone Encounter (Signed)
Patient called to cancel surgery for 04/29/18. "I am not having that surgery". [place] [facility] notified.

## 2018-04-29 ENCOUNTER — Ambulatory Visit (HOSPITAL_COMMUNITY): Admission: RE | Admit: 2018-04-29 | Payer: Medicare Other | Source: Ambulatory Visit | Admitting: Surgery

## 2018-04-29 ENCOUNTER — Encounter (HOSPITAL_COMMUNITY): Admission: RE | Payer: Self-pay | Source: Ambulatory Visit

## 2018-04-29 SURGERY — INSERTION OF ARTERIOVENOUS (AV) GORE-TEX GRAFT ARM
Anesthesia: Monitor Anesthesia Care | Laterality: Left

## 2018-06-22 ENCOUNTER — Other Ambulatory Visit: Payer: Self-pay

## 2018-06-22 ENCOUNTER — Ambulatory Visit (INDEPENDENT_AMBULATORY_CARE_PROVIDER_SITE_OTHER): Payer: Medicare HMO | Admitting: Surgery

## 2018-06-22 ENCOUNTER — Encounter: Payer: Self-pay | Admitting: Surgery

## 2018-06-22 VITALS — BP 124/75 | HR 47 | Temp 97.0°F | Resp 20 | Ht 64.0 in | Wt 95.8 lb

## 2018-06-22 DIAGNOSIS — N184 Chronic kidney disease, stage 4 (severe): Secondary | ICD-10-CM

## 2018-06-22 NOTE — Progress Notes (Signed)
[facility] Specialist of [facility]  Patient name: Bethany [name] MRN: [id number] DOB: 09-22-1946 Sex: female   REASON FOR VISIT:    Follow up  [place]:    Bethany [name] is a 72 y.o. female whom I thought was returning for discussions regarding placement of a graft.  She states that she has never been to our office before.  She is here today to discuss dialysis access.  She is ambidextrous but prefers her right arm.  She is medically managed for hypertension and hyperlipidemia.  She has a history of drug abuse.   PAST MEDICAL HISTORY:   Past Medical History:  Diagnosis Date  . Anemia   . Heart murmur    as a child  . Hyperlipidemia    no meds aken  . Hypertension      FAMILY HISTORY:   Family History  Problem Relation Age of Onset  . Colon cancer Neg Hx   . Pancreatic cancer Neg Hx   . Stomach cancer Neg Hx   . Esophageal cancer Neg Hx   . Rectal cancer Neg Hx     SOCIAL HISTORY:   Social History   Tobacco Use  . Smoking status: Current Every Day Smoker    Packs/day: 0.50  . Smokeless tobacco: Never Used  Substance Use Topics  . Alcohol use: Yes    Alcohol/week: 7.0 standard drinks    Types: 7 Glasses of wine per week     ALLERGIES:   No Known Allergies   CURRENT MEDICATIONS:   Current Outpatient Medications  Medication Sig Dispense Refill  . Amino Acids (DAILY AMINO 6000 PO) Take by mouth daily.    Marland Kitchen atenolol (TENORMIN) 25 MG tablet Take 12.5 mg by mouth daily.    Marland Kitchen BIOTIN PO Take by mouth daily.    . calcitRIOL (ROCALTROL) 0.5 MCG capsule Take 0.5 mcg by mouth daily.  5  . LUTEIN PO Take by mouth daily.    . Multiple Vitamins-Minerals (MULTIVITAMIN WITH MINERALS) tablet Take 1 tablet by mouth daily.    . Omega-3 Fatty Acids (FISH OIL) 1000 MG CAPS Take 1 capsule by mouth daily.    Marland Kitchen VITAMIN A PO Apply to eye daily.    Marland Kitchen VITAMIN E PO Take by mouth daily.    . Na Sulfate-K  Sulfate-Mg Sulf (SUPREP BOWEL PREP KIT) 17.5-3.13-1.6 GM/180ML SOLN Suprep as directed, no substitutions (Patient not taking: Reported on 06/22/2018) 354 mL 0   No current facility-administered medications for this visit.     REVIEW OF SYSTEMS:   [X]  denotes positive finding, [ ]  denotes negative finding Cardiac  Comments:  Chest pain or chest pressure:    Shortness of breath upon exertion:    Short of breath when lying flat:    Irregular heart rhythm:        Vascular    Pain in calf, thigh, or hip brought on by ambulation:    Pain in feet at night that wakes you up from your sleep:     Blood clot in your veins:    Leg swelling:         Pulmonary    Oxygen at home:    Productive cough:     Wheezing:         Neurologic    Sudden weakness in arms or legs:     Sudden numbness in arms or legs:     Sudden onset of difficulty speaking or slurred speech:  Temporary loss of vision in one eye:     Problems with dizziness:         Gastrointestinal    Blood in stool:     Vomited blood:         Genitourinary    Burning when urinating:     Blood in urine:        Psychiatric    Major depression:         Hematologic    Bleeding problems:    Problems with blood clotting too easily:        Skin    Rashes or ulcers:        Constitutional    Fever or chills:      PHYSICAL EXAM:   There were no vitals filed for this visit.  GENERAL: The patient is a well-nourished female, in no acute distress. The vital signs are documented above. CARDIAC: There is a regular rate and rhythm. PULMONARY: Non-labored respirations MUSCULOSKELETAL: There are no major deformities or cyanosis. NEUROLOGIC: No focal weakness or paresthesias are detected. SKIN: There are no ulcers or rashes noted. PSYCHIATRIC: The patient has a normal affect.  STUDIES:   None performed today  MEDICAL ISSUES:   The patient states that she has never seen the or been evaluated in my office before despite  having a documented visit on June 3.  Therefore, I will plan on repeating vascular lab studies.  Unfortunately they could not be performed today and so she will need to be rescheduled to come back for vascular lab studies and to see me to plan her operation.    Annamarie Major, MD [facility] [phone] Pager [phone]

## 2018-06-24 ENCOUNTER — Telehealth: Payer: Self-pay | Admitting: Surgery

## 2018-06-24 ENCOUNTER — Other Ambulatory Visit: Payer: Self-pay

## 2018-06-24 DIAGNOSIS — N185 Chronic kidney disease, stage 5: Secondary | ICD-10-CM

## 2018-06-24 NOTE — Telephone Encounter (Signed)
Patient called to reschedule her appt back to 08/03/18. She stated she cannot keep appt for 07/08/18. I did tell her the MD requested that she be seen sooner that 08/03/18 and he had asked [place] to move the appt up as soon as possible. She declined stating that she cannot come before 08/03/18. I rescheduled appt to that date. awt

## 2018-07-08 ENCOUNTER — Encounter (HOSPITAL_COMMUNITY): Payer: Self-pay

## 2018-07-08 ENCOUNTER — Ambulatory Visit: Payer: Self-pay

## 2018-07-08 ENCOUNTER — Other Ambulatory Visit (HOSPITAL_COMMUNITY): Payer: Self-pay

## 2018-08-03 ENCOUNTER — Encounter: Payer: Self-pay | Admitting: *Deleted

## 2018-08-03 ENCOUNTER — Other Ambulatory Visit: Payer: Self-pay

## 2018-08-03 ENCOUNTER — Ambulatory Visit (INDEPENDENT_AMBULATORY_CARE_PROVIDER_SITE_OTHER)
Admission: RE | Admit: 2018-08-03 | Discharge: 2018-08-03 | Disposition: A | Discharge status: Home / Self Care | Payer: Medicare HMO | Source: Ambulatory Visit | Attending: Surgery | Admitting: Surgery

## 2018-08-03 ENCOUNTER — Other Ambulatory Visit (HOSPITAL_COMMUNITY): Payer: Self-pay

## 2018-08-03 ENCOUNTER — Ambulatory Visit (HOSPITAL_COMMUNITY)
Admission: RE | Admit: 2018-08-03 | Discharge: 2018-08-03 | Disposition: A | Discharge status: Home / Self Care | Payer: Medicare HMO | Source: Ambulatory Visit | Attending: Surgery | Admitting: Surgery

## 2018-08-03 ENCOUNTER — Other Ambulatory Visit: Payer: Self-pay | Admitting: *Deleted

## 2018-08-03 ENCOUNTER — Ambulatory Visit (INDEPENDENT_AMBULATORY_CARE_PROVIDER_SITE_OTHER): Payer: Medicare HMO | Admitting: Physician Assistant

## 2018-08-03 ENCOUNTER — Encounter (HOSPITAL_COMMUNITY): Payer: Self-pay

## 2018-08-03 ENCOUNTER — Ambulatory Visit: Payer: Self-pay | Admitting: Surgery

## 2018-08-03 DIAGNOSIS — Z992 Dependence on renal dialysis: Secondary | ICD-10-CM

## 2018-08-03 DIAGNOSIS — N186 End stage renal disease: Secondary | ICD-10-CM

## 2018-08-03 DIAGNOSIS — N185 Chronic kidney disease, stage 5: Secondary | ICD-10-CM | POA: Insufficient documentation

## 2018-08-03 HISTORY — DX: End stage renal disease: Z99.2

## 2018-08-03 HISTORY — DX: End stage renal disease: N18.6

## 2018-08-03 NOTE — H&P (View-Only) (Signed)
Established Dialysis Access   History of Present Illness   Bethany [name] is a 72 y.o. (07-17-1946) female who presents for re-evaluation of permanent access.  The patient is ambidextrous but prefers to use her R arm for ADLs.  Sine last office visit she has since started on HD via R IJ TDC.  ESRD is managed by Dr. Florene Glen.  She is a former IV drug user.  She is Hepatitis C positive.  She is willing to proceed with placement of dialysis access.  Current Outpatient Medications  Medication Sig Dispense Refill  . Amino Acids (DAILY AMINO 6000 PO) Take by mouth daily.    Marland Kitchen atenolol (TENORMIN) 50 MG tablet Take 50 mg by mouth daily.  5  . BIOTIN PO Take by mouth daily.    . LUTEIN PO Take by mouth daily.    . Multiple Vitamins-Minerals (MULTIVITAMIN WITH MINERALS) tablet Take 1 tablet by mouth daily.    . Na Sulfate-K Sulfate-Mg Sulf (SUPREP BOWEL PREP KIT) 17.5-3.13-1.6 GM/180ML SOLN Suprep as directed, no substitutions 354 mL 0  . Omega-3 Fatty Acids (FISH OIL) 1000 MG CAPS Take 1 capsule by mouth daily.    Marland Kitchen VITAMIN A PO Apply to eye daily.    Marland Kitchen VITAMIN E PO Take by mouth daily.     No current facility-administered medications for this visit.     On ROS today: 10 system ROS is negative unless otherwise noted   Physical Examination   Vitals:   08/03/18 1320  BP: (!) 163/97  Pulse: 64  Resp: 16  Temp: 98.5 F (36.9 C)  TempSrc: Oral  SpO2: 97%  Weight: 116 lb (52.6 kg)  Height: 5' 4" (1.626 m)   Body mass index is 19.91 kg/m.  General Alert, O x 3, WD, NAD  Pulmonary Sym exp, good B air movt, CTA B  Cardiac RRR, Nl S1, S2,   Vascular Vessel Right Left  Radial Palpable Palpable  Brachial Palpable Palpable  Ulnar Palpable Palpable    Musculo- skeletal M/S 5/5 throughout  , Extremities without ischemic changes    Neurologic A&O; CN grossly intact     Non-invasive Vascular Imaging    /BUE Doppler (08/03/18):   R arm:   Brachial: tri, 43 mm  Radial: tri, 22  mm  Ulnar: tri, 10 mm  L arm:   Brachial: tri, 48 mm  Radial: tri, 26 mm  Ulnar: tri, 13 mm  BUE Vein Mapping  (08/03/18):   R arm: no acceptable conduits  L arm: acceptable vein conduits include possibly basilic    Medical Decision Making   Bethany [name] is a 72 y.o. female who presents with ESRD requiring hemodialysis.    Based on vein mapping and examination, this patient's permanent access options include: Left arm arteriovenous graft placement.  Left arm basilic vein is marginal based on ultrasound.  Plan will be for L arm AV fistula versus graft by Dr. Trula Slade on 08/14/18  I had an extensive discussion with this patient in regards to the nature of access surgery, including risk, benefits, and alternatives.    The patient is aware that the risks of access surgery include but are not limited to: bleeding, infection, steal syndrome, nerve damage, failure of access to mature, and possible need for additional access procedures in the future.  The patient has  agreed to proceed with the above procedure.   Dagoberto Ligas PA-C [facility] [place] Office: [phone]

## 2018-08-03 NOTE — Progress Notes (Signed)
    Established Dialysis Access   History of Present Illness   Bethany [name] is a 72 y.o. (07-17-1946) female who presents for re-evaluation of permanent access.  The patient is ambidextrous but prefers to use her R arm for ADLs.  Sine last office visit she has since started on HD via R IJ TDC.  ESRD is managed by Dr. Florene Glen.  She is a former IV drug user.  She is Hepatitis C positive.  She is willing to proceed with placement of dialysis access.  Current Outpatient Medications  Medication Sig Dispense Refill  . Amino Acids (DAILY AMINO 6000 PO) Take by mouth daily.    Marland Kitchen atenolol (TENORMIN) 50 MG tablet Take 50 mg by mouth daily.  5  . BIOTIN PO Take by mouth daily.    . LUTEIN PO Take by mouth daily.    . Multiple Vitamins-Minerals (MULTIVITAMIN WITH MINERALS) tablet Take 1 tablet by mouth daily.    . Na Sulfate-K Sulfate-Mg Sulf (SUPREP BOWEL PREP KIT) 17.5-3.13-1.6 GM/180ML SOLN Suprep as directed, no substitutions 354 mL 0  . Omega-3 Fatty Acids (FISH OIL) 1000 MG CAPS Take 1 capsule by mouth daily.    Marland Kitchen VITAMIN A PO Apply to eye daily.    Marland Kitchen VITAMIN E PO Take by mouth daily.     No current facility-administered medications for this visit.     On ROS today: 10 system ROS is negative unless otherwise noted   Physical Examination   Vitals:   08/03/18 1320  BP: (!) 163/97  Pulse: 64  Resp: 16  Temp: 98.5 F (36.9 C)  TempSrc: Oral  SpO2: 97%  Weight: 116 lb (52.6 kg)  Height: 5' 4" (1.626 m)   Body mass index is 19.91 kg/m.  General Alert, O x 3, WD, NAD  Pulmonary Sym exp, good B air movt, CTA B  Cardiac RRR, Nl S1, S2,   Vascular Vessel Right Left  Radial Palpable Palpable  Brachial Palpable Palpable  Ulnar Palpable Palpable    Musculo- skeletal M/S 5/5 throughout  , Extremities without ischemic changes    Neurologic A&O; CN grossly intact     Non-invasive Vascular Imaging    /BUE Doppler (08/03/18):   R arm:   Brachial: tri, 43 mm  Radial: tri, 22  mm  Ulnar: tri, 10 mm  L arm:   Brachial: tri, 48 mm  Radial: tri, 26 mm  Ulnar: tri, 13 mm  BUE Vein Mapping  (08/03/18):   R arm: no acceptable conduits  L arm: acceptable vein conduits include possibly basilic    Medical Decision Making   Bethany [name] is a 72 y.o. female who presents with ESRD requiring hemodialysis.    Based on vein mapping and examination, this patient's permanent access options include: Left arm arteriovenous graft placement.  Left arm basilic vein is marginal based on ultrasound.  Plan will be for L arm AV fistula versus graft by Dr. Trula Slade on 08/14/18  I had an extensive discussion with this patient in regards to the nature of access surgery, including risk, benefits, and alternatives.    The patient is aware that the risks of access surgery include but are not limited to: bleeding, infection, steal syndrome, nerve damage, failure of access to mature, and possible need for additional access procedures in the future.  The patient has  agreed to proceed with the above procedure.   Dagoberto Ligas PA-C [facility] Office: [phone]

## 2018-08-12 ENCOUNTER — Encounter (HOSPITAL_COMMUNITY): Payer: Self-pay | Admitting: *Deleted

## 2018-08-12 ENCOUNTER — Other Ambulatory Visit: Payer: Self-pay

## 2018-08-12 NOTE — Progress Notes (Signed)
Pt denies acute SOB. Pt denies chest pain and being under the care of a cardiologist. Pt denies having a stress test, echo and cardiac cath. Pt denies having an EKG within the last year. Pt made aware to stop taking vitamins, fish oil, Biotin, Beta Carotene, Flaxseed, krill oil  and herbal medications. Do not take any NSAIDs ie: Ibuprofen, Advil, Naproxen (Aleve), Motrin, BC and Goody Powder. Pt verbalized understanding of all pre-op instructions.

## 2018-08-14 ENCOUNTER — Encounter (HOSPITAL_COMMUNITY): Payer: Self-pay

## 2018-08-14 ENCOUNTER — Ambulatory Visit (HOSPITAL_COMMUNITY): Payer: Medicare HMO | Admitting: Certified Registered"

## 2018-08-14 ENCOUNTER — Ambulatory Visit (HOSPITAL_COMMUNITY)
Admission: RE | Admit: 2018-08-14 | Discharge: 2018-08-14 | Disposition: A | Discharge status: Home / Self Care | Payer: Medicare HMO | Source: Ambulatory Visit | Attending: Surgery | Admitting: Surgery

## 2018-08-14 ENCOUNTER — Other Ambulatory Visit: Payer: Self-pay

## 2018-08-14 ENCOUNTER — Encounter (HOSPITAL_COMMUNITY)
Admission: RE | Disposition: A | Discharge status: Home / Self Care | Payer: Self-pay | Source: Ambulatory Visit | Attending: Surgery

## 2018-08-14 DIAGNOSIS — N186 End stage renal disease: Secondary | ICD-10-CM | POA: Insufficient documentation

## 2018-08-14 DIAGNOSIS — Z9889 Other specified postprocedural states: Secondary | ICD-10-CM | POA: Diagnosis not present

## 2018-08-14 DIAGNOSIS — E785 Hyperlipidemia, unspecified: Secondary | ICD-10-CM | POA: Diagnosis not present

## 2018-08-14 DIAGNOSIS — F17200 Nicotine dependence, unspecified, uncomplicated: Secondary | ICD-10-CM | POA: Diagnosis not present

## 2018-08-14 DIAGNOSIS — I120 Hypertensive chronic kidney disease with stage 5 chronic kidney disease or end stage renal disease: Secondary | ICD-10-CM | POA: Diagnosis not present

## 2018-08-14 DIAGNOSIS — Z79899 Other long term (current) drug therapy: Secondary | ICD-10-CM | POA: Diagnosis not present

## 2018-08-14 DIAGNOSIS — I708 Atherosclerosis of other arteries: Secondary | ICD-10-CM | POA: Diagnosis not present

## 2018-08-14 DIAGNOSIS — Z992 Dependence on renal dialysis: Secondary | ICD-10-CM | POA: Insufficient documentation

## 2018-08-14 DIAGNOSIS — N185 Chronic kidney disease, stage 5: Secondary | ICD-10-CM | POA: Diagnosis not present

## 2018-08-14 HISTORY — DX: Chronic kidney disease, unspecified: N18.9

## 2018-08-14 HISTORY — DX: Unilateral femoral hernia, without obstruction or gangrene, not specified as recurrent: K41.90

## 2018-08-14 HISTORY — DX: Pneumonia, unspecified organism: J18.9

## 2018-08-14 HISTORY — PX: AV FISTULA PLACEMENT: SHX1204

## 2018-08-14 LAB — POCT I-STAT 4, (NA,K, GLUC, HGB,HCT)
GLUCOSE: 83 mg/dL (ref 70–99)
HEMATOCRIT: 34 % — AB (ref 36.0–46.0)
HEMOGLOBIN: 11.6 g/dL — AB (ref 12.0–15.0)
POTASSIUM: 3.2 mmol/L — AB (ref 3.5–5.1)
Sodium: 139 mmol/L (ref 135–145)

## 2018-08-14 SURGERY — ARTERIOVENOUS (AV) FISTULA CREATION
Anesthesia: Monitor Anesthesia Care | Laterality: Left

## 2018-08-14 MED ORDER — PROPOFOL 10 MG/ML IV BOLUS
INTRAVENOUS | Status: DC | PRN
  Administered 2018-08-14: 25 mg via INTRAVENOUS

## 2018-08-14 MED ORDER — ONDANSETRON HCL 4 MG/2ML IJ SOLN
INTRAMUSCULAR | Status: DC | PRN
  Administered 2018-08-14: 4 mg via INTRAVENOUS

## 2018-08-14 MED ORDER — SODIUM CHLORIDE 0.9 % IV SOLN
INTRAVENOUS | Status: AC
  Filled 2018-08-14: qty 1.2

## 2018-08-14 MED ORDER — CEFAZOLIN SODIUM-DEXTROSE 2-4 GM/100ML-% IV SOLN
2.0000 g | INTRAVENOUS | Status: AC
  Administered 2018-08-14: 2 g via INTRAVENOUS
  Filled 2018-08-14: qty 100

## 2018-08-14 MED ORDER — HYDROMORPHONE HCL 1 MG/ML IJ SOLN: 0.2500 mg | INTRAMUSCULAR | Status: DC | PRN

## 2018-08-14 MED ORDER — MIDAZOLAM HCL 2 MG/2ML IJ SOLN
INTRAMUSCULAR | Status: AC
  Filled 2018-08-14: qty 2

## 2018-08-14 MED ORDER — SODIUM CHLORIDE 0.9 % IV SOLN
INTRAVENOUS | Status: DC | PRN
  Administered 2018-08-14: 500 mL

## 2018-08-14 MED ORDER — CHLORHEXIDINE GLUCONATE 4 % EX LIQD: 60.0000 mL | Freq: Once | CUTANEOUS | Status: DC

## 2018-08-14 MED ORDER — HEPARIN SODIUM (PORCINE) 1000 UNIT/ML IJ SOLN
INTRAMUSCULAR | Status: AC
  Filled 2018-08-14: qty 1

## 2018-08-14 MED ORDER — 0.9 % SODIUM CHLORIDE (POUR BTL) OPTIME
TOPICAL | Status: DC | PRN
  Administered 2018-08-14: 1000 mL

## 2018-08-14 MED ORDER — HEMOSTATIC AGENTS (NO CHARGE) OPTIME
TOPICAL | Status: DC | PRN
  Administered 2018-08-14: 1 via TOPICAL

## 2018-08-14 MED ORDER — FENTANYL CITRATE (PF) 250 MCG/5ML IJ SOLN
INTRAMUSCULAR | Status: AC
  Filled 2018-08-14: qty 5

## 2018-08-14 MED ORDER — OXYCODONE HCL 5 MG PO TABS: 5.0000 mg | ORAL_TABLET | Freq: Once | ORAL | Status: DC | PRN

## 2018-08-14 MED ORDER — FENTANYL CITRATE (PF) 100 MCG/2ML IJ SOLN
INTRAMUSCULAR | Status: DC | PRN
  Administered 2018-08-14 (×4): 25 ug via INTRAVENOUS

## 2018-08-14 MED ORDER — SODIUM CHLORIDE 0.9 % IV SOLN
INTRAVENOUS | Status: DC | PRN
  Administered 2018-08-14: 30 ug/min via INTRAVENOUS

## 2018-08-14 MED ORDER — SODIUM CHLORIDE 0.9 % IV SOLN
INTRAVENOUS | Status: DC
  Administered 2018-08-14: 07:00:00 via INTRAVENOUS

## 2018-08-14 MED ORDER — PROMETHAZINE HCL 25 MG/ML IJ SOLN: 6.2500 mg | INTRAMUSCULAR | Status: DC | PRN

## 2018-08-14 MED ORDER — PROPOFOL 500 MG/50ML IV EMUL
INTRAVENOUS | Status: DC | PRN
  Administered 2018-08-14: 100 ug/kg/min via INTRAVENOUS

## 2018-08-14 MED ORDER — ONDANSETRON HCL 4 MG/2ML IJ SOLN
INTRAMUSCULAR | Status: AC
  Filled 2018-08-14: qty 2

## 2018-08-14 MED ORDER — MIDAZOLAM HCL 5 MG/5ML IJ SOLN
INTRAMUSCULAR | Status: DC | PRN
  Administered 2018-08-14 (×2): 0.5 mg via INTRAVENOUS

## 2018-08-14 MED ORDER — PROTAMINE SULFATE 10 MG/ML IV SOLN
INTRAVENOUS | Status: DC | PRN
  Administered 2018-08-14: 25 mg via INTRAVENOUS

## 2018-08-14 MED ORDER — HEPARIN SODIUM (PORCINE) 1000 UNIT/ML IJ SOLN
INTRAMUSCULAR | Status: DC | PRN
  Administered 2018-08-14: 3000 [IU] via INTRAVENOUS

## 2018-08-14 MED ORDER — OXYCODONE HCL 5 MG/5ML PO SOLN: 5.0000 mg | Freq: Once | ORAL | Status: DC | PRN

## 2018-08-14 MED ORDER — SODIUM CHLORIDE 0.9 % IV SOLN: INTRAVENOUS | Status: DC | PRN

## 2018-08-14 MED ORDER — LIDOCAINE-EPINEPHRINE (PF) 1 %-1:200000 IJ SOLN
INTRAMUSCULAR | Status: AC
  Filled 2018-08-14: qty 30

## 2018-08-14 MED ORDER — OXYCODONE HCL 5 MG PO TABS: 5.0000 mg | ORAL_TABLET | Freq: Three times a day (TID) | ORAL | 0 refills | Status: AC | PRN

## 2018-08-14 MED ORDER — LIDOCAINE-EPINEPHRINE (PF) 1 %-1:200000 IJ SOLN
INTRAMUSCULAR | Status: DC | PRN
  Administered 2018-08-14: 30 mL

## 2018-08-14 MED ORDER — PROPOFOL 1000 MG/100ML IV EMUL
INTRAVENOUS | Status: AC
  Filled 2018-08-14: qty 100

## 2018-08-14 SURGICAL SUPPLY — 37 items
ADH SKN CLS APL DERMABOND .7 (GAUZE/BANDAGES/DRESSINGS) ×1
ARMBAND PINK RESTRICT EXTREMIT (MISCELLANEOUS) ×4 IMPLANT
CANISTER SUCT 3000ML PPV (MISCELLANEOUS) ×3 IMPLANT
CLIP VESOCCLUDE MED 6/CT (CLIP) ×3 IMPLANT
CLIP VESOCCLUDE SM WIDE 6/CT (CLIP) ×3 IMPLANT
COVER PROBE W GEL 5X96 (DRAPES) ×3 IMPLANT
COVER WAND RF STERILE (DRAPES) ×3 IMPLANT
DERMABOND ADVANCED (GAUZE/BANDAGES/DRESSINGS) ×2
DERMABOND ADVANCED .7 DNX12 (GAUZE/BANDAGES/DRESSINGS) ×1 IMPLANT
ELECT REM PT RETURN 9FT ADLT (ELECTROSURGICAL) ×3
ELECTRODE REM PT RTRN 9FT ADLT (ELECTROSURGICAL) ×1 IMPLANT
GLOVE BIO SURGEON STRL SZ 6.5 (GLOVE) ×2 IMPLANT
GLOVE BIO SURGEONS STRL SZ 6.5 (GLOVE) ×2
GLOVE BIOGEL M 6.5 STRL (GLOVE) ×4 IMPLANT
GLOVE BIOGEL PI IND STRL 6.5 (GLOVE) IMPLANT
GLOVE BIOGEL PI IND STRL 7.5 (GLOVE) ×1 IMPLANT
GLOVE BIOGEL PI INDICATOR 6.5 (GLOVE) ×14
GLOVE BIOGEL PI INDICATOR 7.5 (GLOVE) ×2
GLOVE SURG SS PI 7.5 STRL IVOR (GLOVE) ×3 IMPLANT
GOWN STRL REUS W/ TWL LRG LVL3 (GOWN DISPOSABLE) ×2 IMPLANT
GOWN STRL REUS W/ TWL XL LVL3 (GOWN DISPOSABLE) ×1 IMPLANT
GOWN STRL REUS W/TWL LRG LVL3 (GOWN DISPOSABLE) ×6
GOWN STRL REUS W/TWL XL LVL3 (GOWN DISPOSABLE) ×3
GRAFT GORETEX STRT 4-7X45 (Vascular Products) ×2 IMPLANT
HEMOSTAT SNOW SURGICEL 2X4 (HEMOSTASIS) ×2 IMPLANT
KIT BASIN OR (CUSTOM PROCEDURE TRAY) ×3 IMPLANT
KIT TURNOVER KIT B (KITS) ×3 IMPLANT
NS IRRIG 1000ML POUR BTL (IV SOLUTION) ×3 IMPLANT
PACK CV ACCESS (CUSTOM PROCEDURE TRAY) ×3 IMPLANT
PAD ARMBOARD 7.5X6 YLW CONV (MISCELLANEOUS) ×6 IMPLANT
SUT PROLENE 6 0 CC (SUTURE) ×5 IMPLANT
SUT VIC AB 3-0 SH 27 (SUTURE) ×3
SUT VIC AB 3-0 SH 27X BRD (SUTURE) ×1 IMPLANT
SUT VICRYL 4-0 PS2 18IN ABS (SUTURE) ×3 IMPLANT
TOWEL GREEN STERILE (TOWEL DISPOSABLE) ×3 IMPLANT
UNDERPAD 30X30 (UNDERPADS AND DIAPERS) ×3 IMPLANT
WATER STERILE IRR 1000ML POUR (IV SOLUTION) ×3 IMPLANT

## 2018-08-14 NOTE — Anesthesia Postprocedure Evaluation (Signed)
Anesthesia Post Note  Patient: Armed forces technical officer  Procedure(s) Performed: INSERTION OF ARTERIOVENOUS GRAFT LEFT ARM (Left )     Patient location during evaluation: PACU Anesthesia Type: MAC Level of consciousness: awake and alert Pain management: pain level controlled Vital Signs Assessment: post-procedure vital signs reviewed and stable Respiratory status: spontaneous breathing, nonlabored ventilation and respiratory function stable Cardiovascular status: blood pressure returned to baseline and stable Postop Assessment: no apparent nausea or vomiting Anesthetic complications: no    Last Vitals:  Vitals:   08/14/18 0937 08/14/18 0950  BP: 121/85 (!) 142/86  Pulse: (!) 53 (!) 50  Resp: 14   Temp: 36.5 C   SpO2: 98% 98%    Last Pain:  Vitals:   08/14/18 0937  TempSrc:   PainSc: 0-No pain                 Lynda Rainwater

## 2018-08-14 NOTE — Op Note (Signed)
    Patient name: Bethany [name] MRN: [id number] DOB: 1946/03/03 Sex: female  08/14/2018 Pre-operative Diagnosis: ESRD Post-operative diagnosis:  Same Surgeon:  Annamarie Major Assistants:  Izetta Dakin Procedure:   Left upper extremity dialysis graft (4 x 7) Anesthesia: MAC Blood Loss:  minimal Specimens:  none  Findings: 3 mm calcified brachial artery.  Excellent axillary vein  Indications: The patient comes in today for dialysis access.  Vein mapping suggested small surface veins.  Procedure:  The patient was identified in the holding area and taken to [place] [facility]  The patient was then placed supine on the table. MAC anesthesia was administered.  The patient was prepped and draped in the usual sterile fashion.  A time out was called and antibiotics were administered.  1% lidocaine was used for local anesthesia.  I evaluated the cephalic and basilic vein with ultrasound and they appear to be too small for fistula creation, therefore I proceeded with a dialysis graft.  I made a longitudinal incision just proximal to the antecubital crease.  Through this incision I exposed the brachial artery.  This was mild to moderately calcified and measuring about 3 mm.  It was fully mobilized.  Next, a transverse incision was made up near the axilla.  Through this incision I dissected out the brachial vein.  This was a healthy appearing 5 x 6 mm vein.  I then used a curved tunneler to create a subcutaneous tunnel.  A 4 x 7 stretch Gore-Tex graft was brought through the tunnel.  The patient was given 3000 use of heparin.  After the heparin circulated, the brachial artery was occluded with vascular clamps.  A #11 blade used to make an arteriotomy which was extended longitudinally with Potts scissors.  The graft was beveled for this of the arteriotomy running anastomosis was created 6-0 Prolene.  Prior to completion, the appropriate flushing maneuvers were performed and the anastomosis was completed.  There was  excellent pulsatile flow through the graft which was occluded and flushed with heparin saline.  Attention was then turned towards the brachial vein.  It was occluded with vascular clamps.  A #11 blade was used to make an arteriotomy which was extended longitudinally with Potts scissors.  A end-to-side anastomosis was created with 6-0 Prolene.  Prior to completion the appropriate flushing maneuvers were performed and the anastomosis was completed.  There was a palpable thrill within the graft.  The patient had a biphasic radial artery Doppler signal.  25 mg of protamine was given.  After hemostasis was satisfactory, the incision was closed with 2 layers of 3-0 Vicryl followed by Dermabond.  There were no immediate complications   Disposition: To PACU stable   V. Annamarie Major, M.D. [facility] Office: [phone] Pager:  [phone]

## 2018-08-14 NOTE — Anesthesia Preprocedure Evaluation (Signed)
Anesthesia Evaluation  Patient identified by MRN, date of birth, ID band Patient awake    Reviewed: Allergy & Precautions, NPO status , Patient's Chart, lab work & pertinent test results, reviewed documented beta blocker date and time   Airway Mallampati: II  TM Distance: >3 FB Neck ROM: Full    Dental no notable dental hx.    Pulmonary neg pulmonary ROS, Current Smoker,    Pulmonary exam normal breath sounds clear to auscultation       Cardiovascular hypertension, Pt. on medications and Pt. on home beta blockers negative cardio ROS Normal cardiovascular exam Rhythm:Regular Rate:Normal     Neuro/Psych negative neurological ROS  negative psych ROS   GI/Hepatic negative GI ROS, Neg liver ROS,   Endo/Other  negative endocrine ROS  Renal/GU ESRF and DialysisRenal disease  negative genitourinary   Musculoskeletal negative musculoskeletal ROS (+)   Abdominal   Peds negative pediatric ROS (+)  Hematology negative hematology ROS (+)   Anesthesia Other Findings   Reproductive/Obstetrics negative OB ROS                             Anesthesia Physical Anesthesia Plan  ASA: IV  Anesthesia Plan: MAC   Post-op Pain Management:    Induction: Intravenous  PONV Risk Score and Plan: 1 and Ondansetron  Airway Management Planned: Simple Face Mask  Additional Equipment:   Intra-op Plan:   Post-operative Plan:   Informed Consent: I have reviewed the patients History and Physical, chart, labs and discussed the procedure including the risks, benefits and alternatives for the proposed anesthesia with the patient or authorized representative who has indicated his/her understanding and acceptance.   Dental advisory given  Plan Discussed with: CRNA  Anesthesia Plan Comments:         Anesthesia Quick Evaluation

## 2018-08-14 NOTE — Anesthesia Procedure Notes (Signed)
Procedure Name: MAC Date/Time: 08/14/2018 8:35 AM Performed by: Imagene Riches, CRNA Pre-anesthesia Checklist: Patient identified, Emergency Drugs available, Suction available and Patient being monitored Patient Re-evaluated:Patient Re-evaluated prior to induction Oxygen Delivery Method: Nasal cannula

## 2018-08-14 NOTE — Interval H&P Note (Signed)
History and Physical Interval Note:  08/14/2018 7:35 AM  Bethany [name]  has presented today for surgery, with the diagnosis of END STAGE RENAL DISEASE FOR HEMODIALYSIS ACCESS  The various methods of treatment have been discussed with the patient and family. After consideration of risks, benefits and other options for treatment, the patient has consented to  Procedure(s): ARTERIOVENOUS (AV) FISTULA CREATION VERSUS INSERTION OF ARTERIOVENOUS GRAFT LEFT ARM (Left) as a surgical intervention .  The patient's history has been reviewed, patient examined, no change in status, stable for surgery.  I have reviewed the patient's chart and labs.  Questions were answered to the patient's satisfaction.     Annamarie Major

## 2018-08-14 NOTE — Progress Notes (Signed)
Dr. Royetta Car made aware of pt's elevated bp 183/126, and that she took her own medicine of Atenolol $RemoveBef'50mg'[id number]$  at 0640.

## 2018-08-14 NOTE — Transfer of Care (Signed)
Immediate Anesthesia Transfer of Care Note  Patient: Bethany [name]  Procedure(s) Performed: INSERTION OF ARTERIOVENOUS GRAFT LEFT ARM (Left )  Patient Location: PACU  Anesthesia Type:MAC  Level of Consciousness: awake, alert  and oriented  Airway & Oxygen Therapy: Patient Spontanous Breathing  Post-op Assessment: Report given to RN and Post -op Vital signs reviewed and stable  Post vital signs: Reviewed and stable  Last Vitals:  Vitals Value Taken Time  BP    Temp    Pulse    Resp    SpO2      Last Pain:  Vitals:   08/14/18 0649  TempSrc:   PainSc: 0-No pain      Patients Stated Pain Goal: 3 (58/52/77 8242)  Complications: No apparent anesthesia complications

## 2018-08-17 ENCOUNTER — Encounter (HOSPITAL_COMMUNITY): Payer: Self-pay | Admitting: Surgery

## 2018-09-07 ENCOUNTER — Other Ambulatory Visit: Payer: Self-pay | Admitting: Family Medicine

## 2018-09-07 DIAGNOSIS — Z1231 Encounter for screening mammogram for malignant neoplasm of breast: Secondary | ICD-10-CM

## 2018-09-07 DIAGNOSIS — Z780 Asymptomatic menopausal state: Secondary | ICD-10-CM

## 2018-09-17 ENCOUNTER — Other Ambulatory Visit: Payer: Self-pay | Admitting: Family Medicine

## 2018-09-17 DIAGNOSIS — Z13820 Encounter for screening for osteoporosis: Secondary | ICD-10-CM

## 2018-10-08 ENCOUNTER — Ambulatory Visit: Payer: Self-pay

## 2018-10-23 ENCOUNTER — Other Ambulatory Visit: Payer: Self-pay | Admitting: Family Medicine

## 2018-10-23 ENCOUNTER — Ambulatory Visit (INDEPENDENT_AMBULATORY_CARE_PROVIDER_SITE_OTHER): Payer: Medicare HMO | Admitting: Internal Medicine

## 2018-10-23 DIAGNOSIS — J441 Chronic obstructive pulmonary disease with (acute) exacerbation: Secondary | ICD-10-CM

## 2018-10-23 LAB — PULMONARY FUNCTION TEST
FEF 25-75 POST: 1.12 L/s
FEF 25-75 Pre: 0.8 L/sec
FEF2575-%CHANGE-POST: 40 %
FEF2575-%Pred-Post: 68 %
FEF2575-%Pred-Pre: 49 %
FEV1-%CHANGE-POST: 15 %
FEV1-%PRED-POST: 84 %
FEV1-%PRED-PRE: 73 %
FEV1-POST: 1.47 L
FEV1-Pre: 1.28 L
FEV1FVC-%CHANGE-POST: 11 %
FEV1FVC-%PRED-PRE: 82 %
FEV6-%Change-Post: 2 %
FEV6-%Pred-Post: 94 %
FEV6-%Pred-Pre: 92 %
FEV6-POST: 2.05 L
FEV6-Pre: 2 L
FEV6FVC-%Change-Post: 0 %
FEV6FVC-%PRED-POST: 103 %
FEV6FVC-%Pred-Pre: 104 %
FVC-%Change-Post: 3 %
FVC-%PRED-PRE: 88 %
FVC-%Pred-Post: 91 %
FVC-POST: 2.06 L
FVC-PRE: 2 L
Post FEV1/FVC ratio: 71 %
Post FEV6/FVC ratio: 99 %
Pre FEV1/FVC ratio: 64 %
Pre FEV6/FVC Ratio: 100 %
RV % pred: 165 %
RV: 3.54 L
TLC % PRED: 116 %
TLC: 5.72 L

## 2018-10-23 NOTE — Progress Notes (Signed)
Pt completed PFT today. Pt was unable to take a deep breathe during the DLCO, tried multiply times to complete and was able. Pt asked to stop trying after three tries, and con't the test.

## 2018-11-10 ENCOUNTER — Other Ambulatory Visit: Payer: Self-pay

## 2018-11-10 ENCOUNTER — Ambulatory Visit: Payer: Self-pay

## 2019-01-01 ENCOUNTER — Ambulatory Visit
Admission: RE | Admit: 2019-01-01 | Discharge: 2019-01-01 | Disposition: A | Discharge status: Home / Self Care | Payer: Medicare HMO | Source: Ambulatory Visit | Attending: Family Medicine | Admitting: Family Medicine

## 2019-01-01 DIAGNOSIS — Z1231 Encounter for screening mammogram for malignant neoplasm of breast: Secondary | ICD-10-CM

## 2019-01-01 DIAGNOSIS — Z13820 Encounter for screening for osteoporosis: Secondary | ICD-10-CM

## 2019-01-01 IMAGING — MG DIGITAL SCREENING BILATERAL MAMMOGRAM WITH TOMO AND CAD
8 series · 8 of 24 positions shown · non-contrast
Comparison: Previous exam(s).

CLINICAL DATA: Screening.

EXAM:
DIGITAL SCREENING BILATERAL MAMMOGRAM WITH TOMO AND CAD

[R CC synth-2D]
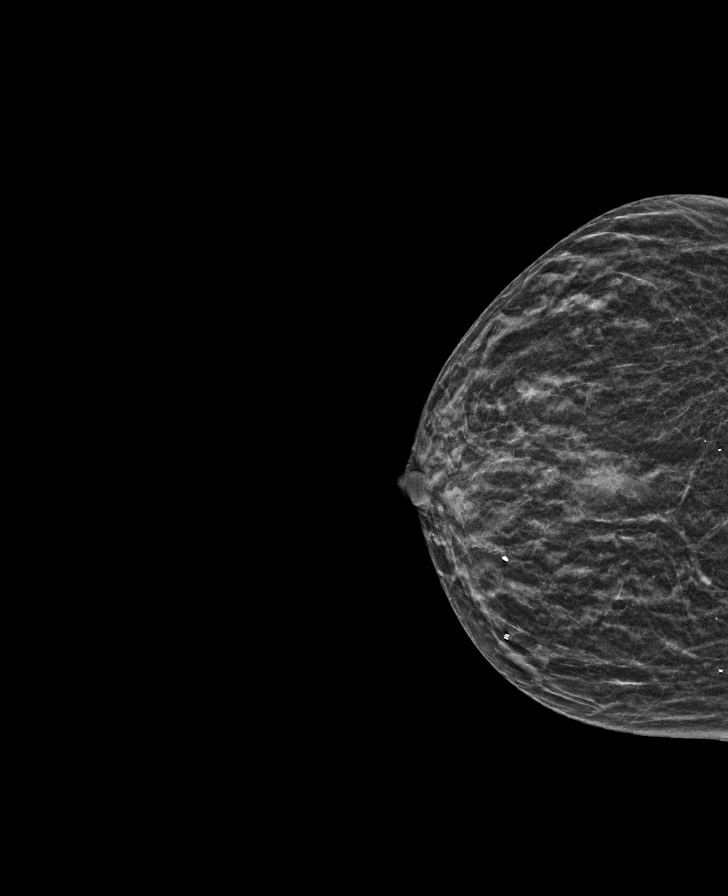

[L CC synth-2D]
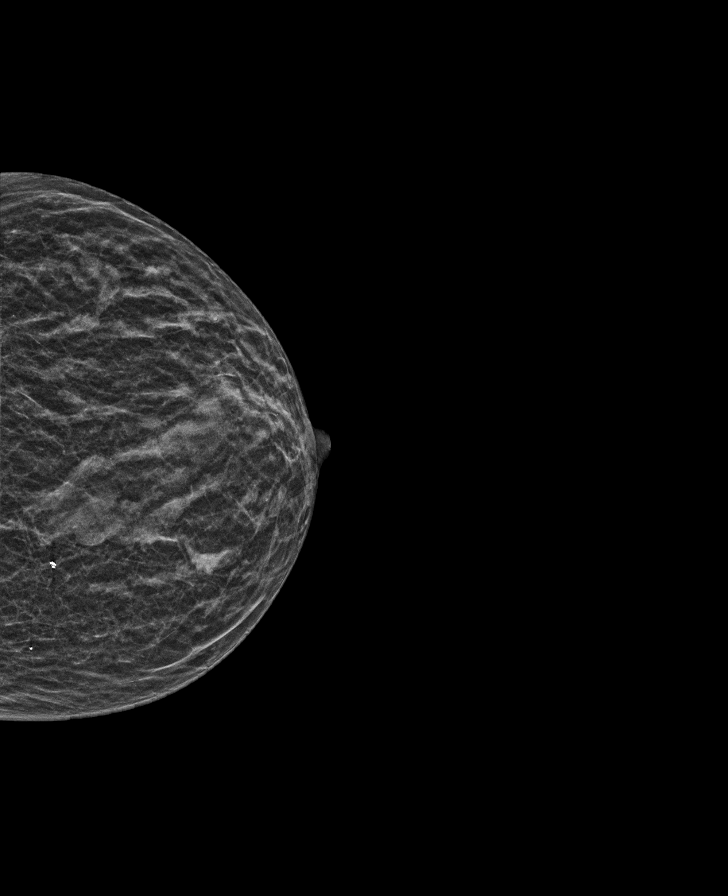

[L MLO synth-2D]
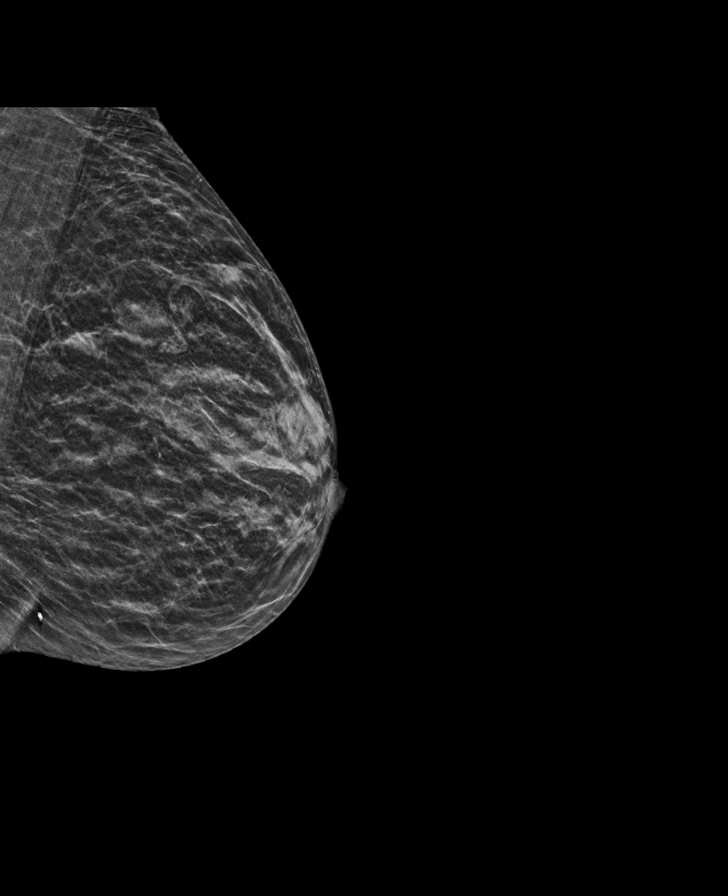

[R MLO synth-2D]
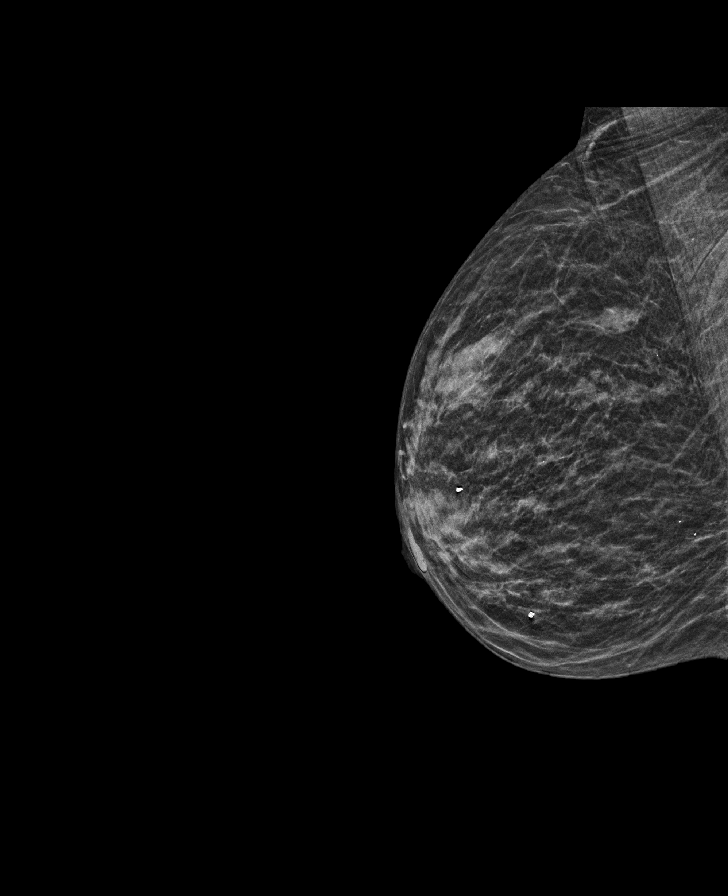

[R MLO tomo · tomo slice 19/36.0]
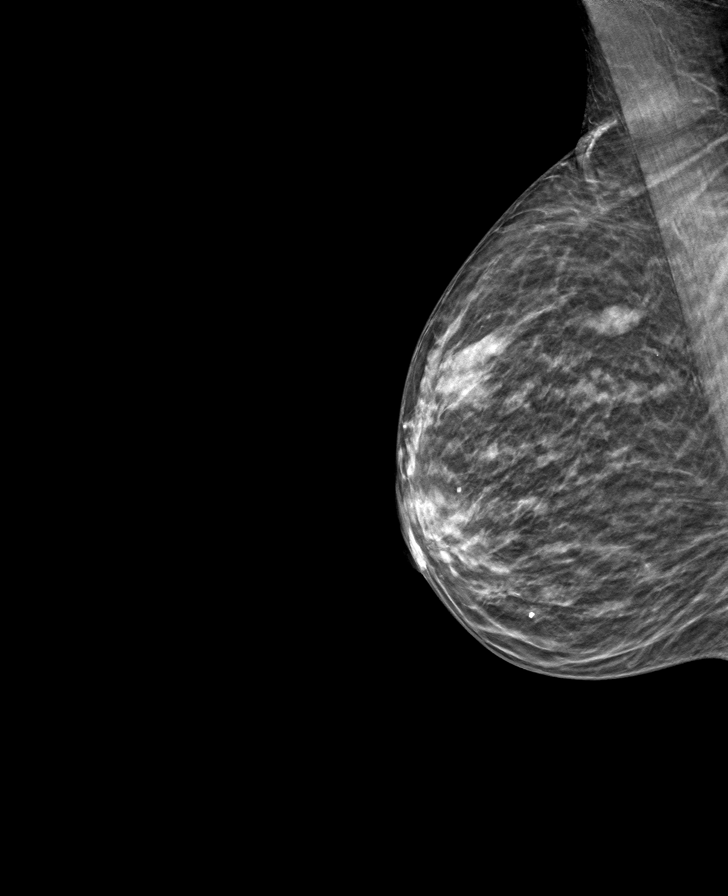

[R CC tomo · tomo slice 17/33.0]
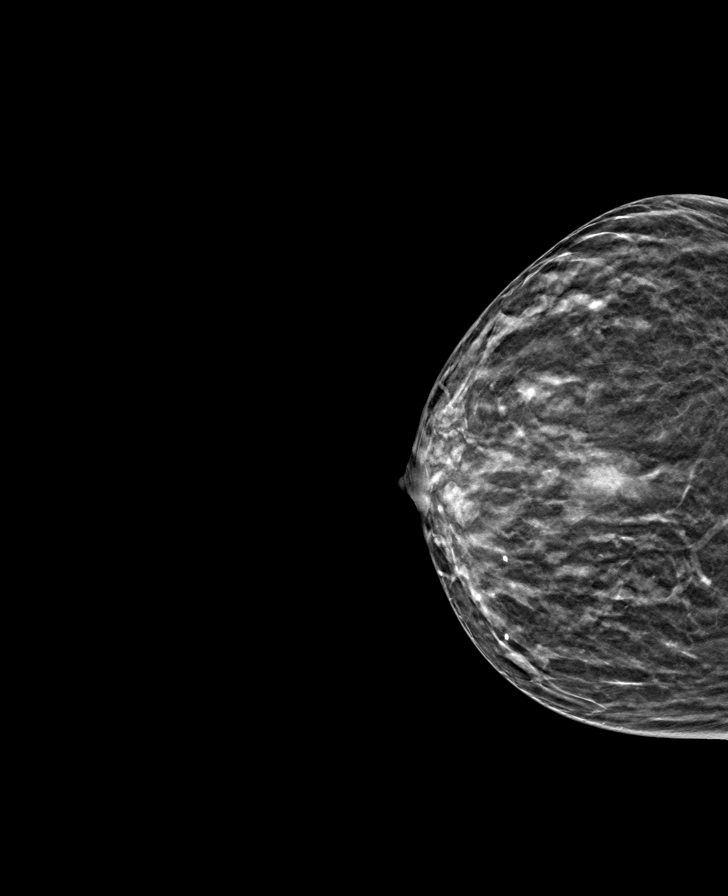

[L CC tomo · tomo slice 16/31.0]
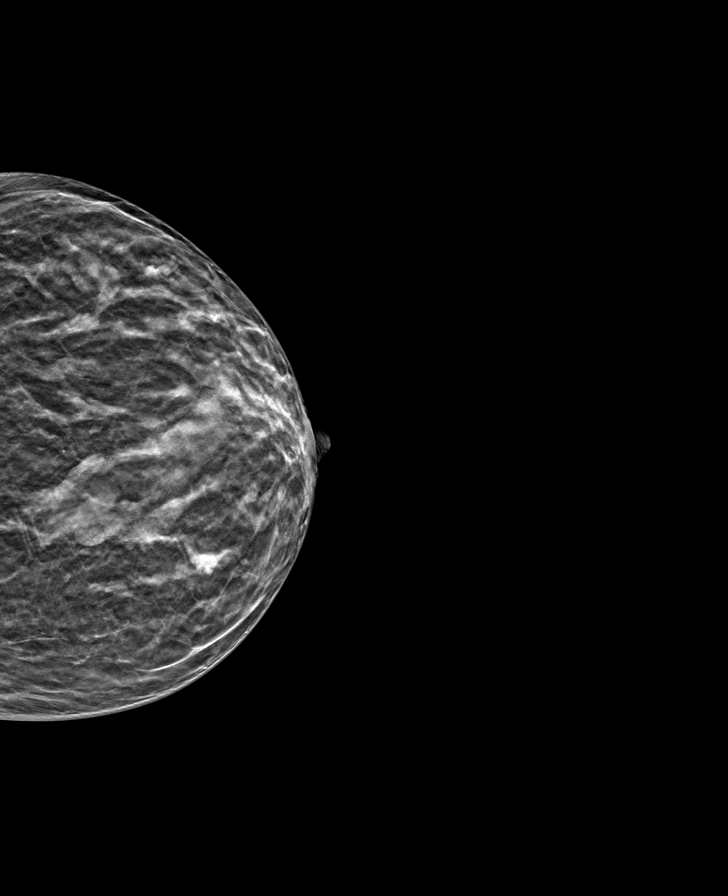

[L MLO tomo · tomo slice 17/34.0]
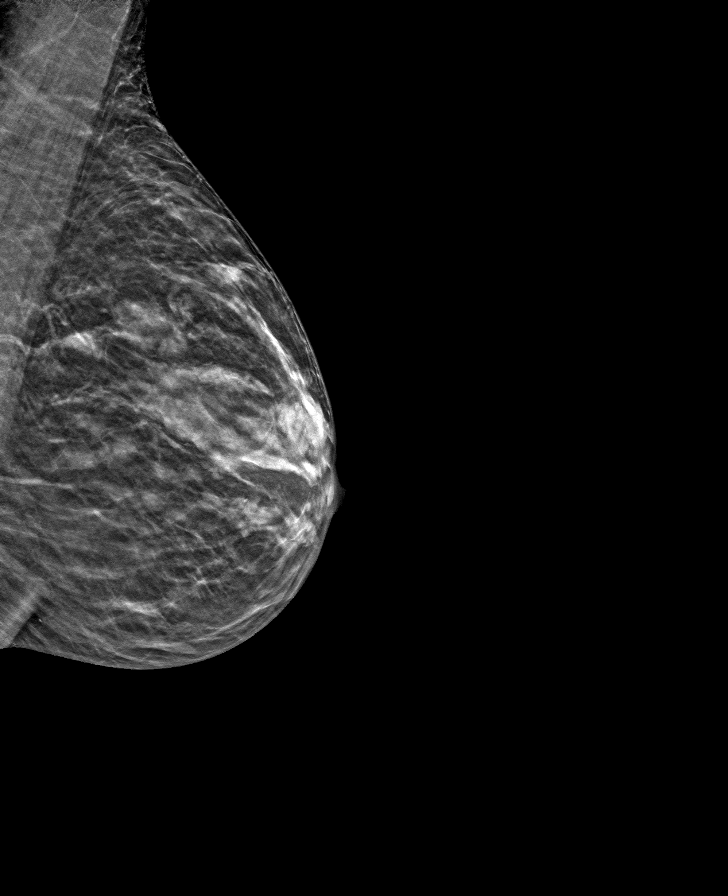

[8 of 24 positions shown; findings below may reference images not displayed]

ACR Breast Density Category c: The breast tissue is heterogeneously
dense, which may obscure small masses.
FINDINGS: There are no findings suspicious for malignancy. Images were
processed with CAD.
IMPRESSION: No mammographic evidence of malignancy. A result letter of this
screening mammogram will be mailed directly to the patient.

RECOMMENDATION:
Screening mammogram in one year. (Code:[5V])

BI-RADS CATEGORY  1: Negative.

## 2019-08-18 ENCOUNTER — Other Ambulatory Visit (HOSPITAL_COMMUNITY): Payer: Self-pay | Admitting: Nephrology

## 2019-08-18 DIAGNOSIS — N186 End stage renal disease: Secondary | ICD-10-CM

## 2019-08-24 ENCOUNTER — Other Ambulatory Visit: Payer: Self-pay | Admitting: Radiology

## 2019-08-25 ENCOUNTER — Encounter (HOSPITAL_COMMUNITY): Payer: Self-pay

## 2019-08-25 ENCOUNTER — Ambulatory Visit (HOSPITAL_COMMUNITY): Admission: RE | Admit: 2019-08-25 | Payer: Medicare HMO | Source: Ambulatory Visit

## 2019-10-11 DIAGNOSIS — W19XXXA Unspecified fall, initial encounter: Secondary | ICD-10-CM | POA: Diagnosis present

## 2019-10-11 HISTORY — DX: Unspecified fall, initial encounter: W19.XXXA

## 2019-10-20 ENCOUNTER — Encounter (HOSPITAL_COMMUNITY): Payer: Self-pay

## 2019-10-20 ENCOUNTER — Emergency Department (HOSPITAL_COMMUNITY): Payer: Medicare Other

## 2019-10-20 ENCOUNTER — Inpatient Hospital Stay (HOSPITAL_COMMUNITY)
Admission: EM | Admit: 2019-10-20 | Discharge: 2019-10-22 | DRG: 070 | Disposition: A | Discharge status: Home Health | Payer: Medicare Other | Attending: Family Medicine | Admitting: Family Medicine

## 2019-10-20 ENCOUNTER — Inpatient Hospital Stay (HOSPITAL_COMMUNITY): Payer: Medicare Other

## 2019-10-20 ENCOUNTER — Other Ambulatory Visit: Payer: Self-pay

## 2019-10-20 DIAGNOSIS — G253 Myoclonus: Secondary | ICD-10-CM | POA: Diagnosis present

## 2019-10-20 DIAGNOSIS — G9341 Metabolic encephalopathy: Secondary | ICD-10-CM | POA: Diagnosis not present

## 2019-10-20 DIAGNOSIS — R410 Disorientation, unspecified: Secondary | ICD-10-CM

## 2019-10-20 DIAGNOSIS — R636 Underweight: Secondary | ICD-10-CM | POA: Diagnosis not present

## 2019-10-20 DIAGNOSIS — E785 Hyperlipidemia, unspecified: Secondary | ICD-10-CM | POA: Diagnosis not present

## 2019-10-20 DIAGNOSIS — Z681 Body mass index (BMI) 19 or less, adult: Secondary | ICD-10-CM | POA: Diagnosis not present

## 2019-10-20 DIAGNOSIS — F17200 Nicotine dependence, unspecified, uncomplicated: Secondary | ICD-10-CM | POA: Diagnosis present

## 2019-10-20 DIAGNOSIS — R278 Other lack of coordination: Secondary | ICD-10-CM | POA: Diagnosis present

## 2019-10-20 DIAGNOSIS — R4701 Aphasia: Secondary | ICD-10-CM | POA: Diagnosis present

## 2019-10-20 DIAGNOSIS — F17210 Nicotine dependence, cigarettes, uncomplicated: Secondary | ICD-10-CM | POA: Diagnosis not present

## 2019-10-20 DIAGNOSIS — J449 Chronic obstructive pulmonary disease, unspecified: Secondary | ICD-10-CM | POA: Diagnosis present

## 2019-10-20 DIAGNOSIS — G2581 Restless legs syndrome: Secondary | ICD-10-CM | POA: Diagnosis present

## 2019-10-20 DIAGNOSIS — N19 Unspecified kidney failure: Secondary | ICD-10-CM | POA: Diagnosis not present

## 2019-10-20 DIAGNOSIS — R64 Cachexia: Secondary | ICD-10-CM | POA: Diagnosis not present

## 2019-10-20 DIAGNOSIS — Z79899 Other long term (current) drug therapy: Secondary | ICD-10-CM

## 2019-10-20 DIAGNOSIS — R4182 Altered mental status, unspecified: Secondary | ICD-10-CM | POA: Diagnosis present

## 2019-10-20 DIAGNOSIS — W19XXXA Unspecified fall, initial encounter: Secondary | ICD-10-CM | POA: Diagnosis not present

## 2019-10-20 DIAGNOSIS — R4702 Dysphasia: Secondary | ICD-10-CM | POA: Diagnosis not present

## 2019-10-20 DIAGNOSIS — N2581 Secondary hyperparathyroidism of renal origin: Secondary | ICD-10-CM | POA: Diagnosis not present

## 2019-10-20 DIAGNOSIS — R4189 Other symptoms and signs involving cognitive functions and awareness: Secondary | ICD-10-CM | POA: Diagnosis present

## 2019-10-20 DIAGNOSIS — Z992 Dependence on renal dialysis: Secondary | ICD-10-CM

## 2019-10-20 DIAGNOSIS — I120 Hypertensive chronic kidney disease with stage 5 chronic kidney disease or end stage renal disease: Secondary | ICD-10-CM | POA: Diagnosis present

## 2019-10-20 DIAGNOSIS — Z79891 Long term (current) use of opiate analgesic: Secondary | ICD-10-CM | POA: Diagnosis not present

## 2019-10-20 DIAGNOSIS — N186 End stage renal disease: Secondary | ICD-10-CM | POA: Diagnosis not present

## 2019-10-20 DIAGNOSIS — G9349 Other encephalopathy: Secondary | ICD-10-CM | POA: Diagnosis not present

## 2019-10-20 DIAGNOSIS — Z9181 History of falling: Secondary | ICD-10-CM

## 2019-10-20 DIAGNOSIS — Z20828 Contact with and (suspected) exposure to other viral communicable diseases: Secondary | ICD-10-CM | POA: Diagnosis present

## 2019-10-20 DIAGNOSIS — D631 Anemia in chronic kidney disease: Secondary | ICD-10-CM | POA: Diagnosis present

## 2019-10-20 LAB — CBC WITH DIFFERENTIAL/PLATELET
Abs Immature Granulocytes: 0.02 10*3/uL (ref 0.00–0.07)
Basophils Absolute: 0 10*3/uL (ref 0.0–0.1)
Basophils Relative: 1 %
Eosinophils Absolute: 0 10*3/uL (ref 0.0–0.5)
Eosinophils Relative: 1 %
HCT: 32.8 % — ABNORMAL LOW (ref 36.0–46.0)
Hemoglobin: 10.6 g/dL — ABNORMAL LOW (ref 12.0–15.0)
Immature Granulocytes: 0 %
Lymphocytes Relative: 20 %
Lymphs Abs: 1.5 10*3/uL (ref 0.7–4.0)
MCH: 32.5 pg (ref 26.0–34.0)
MCHC: 32.3 g/dL (ref 30.0–36.0)
MCV: 100.6 fL — ABNORMAL HIGH (ref 80.0–100.0)
Monocytes Absolute: 1.1 10*3/uL — ABNORMAL HIGH (ref 0.1–1.0)
Monocytes Relative: 14 %
Neutro Abs: 4.9 10*3/uL (ref 1.7–7.7)
Neutrophils Relative %: 64 %
Platelets: 334 10*3/uL (ref 150–400)
RBC: 3.26 MIL/uL — ABNORMAL LOW (ref 3.87–5.11)
RDW: 15.6 % — ABNORMAL HIGH (ref 11.5–15.5)
WBC: 7.6 10*3/uL (ref 4.0–10.5)
nRBC: 0 % (ref 0.0–0.2)

## 2019-10-20 LAB — TROPONIN I (HIGH SENSITIVITY)
Troponin I (High Sensitivity): 35 ng/L — ABNORMAL HIGH (ref <18)
Troponin I (High Sensitivity): 33 ng/L — ABNORMAL HIGH (ref <18)

## 2019-10-20 LAB — COMPREHENSIVE METABOLIC PANEL
ALT: 28 U/L (ref 0–44)
AST: 63 U/L — ABNORMAL HIGH (ref 15–41)
Albumin: 3.2 g/dL — ABNORMAL LOW (ref 3.5–5.0)
Alkaline Phosphatase: 71 U/L (ref 38–126)
Anion gap: 18 — ABNORMAL HIGH (ref 5–15)
BUN: 53 mg/dL — ABNORMAL HIGH (ref 8–23)
CO2: 32 mmol/L (ref 22–32)
Calcium: 9.1 mg/dL (ref 8.9–10.3)
Chloride: 93 mmol/L — ABNORMAL LOW (ref 98–111)
Creatinine, Ser: 11.02 mg/dL — ABNORMAL HIGH (ref 0.44–1.00)
GFR calc Af Amer: 4 mL/min — ABNORMAL LOW (ref >60)
GFR calc non Af Amer: 3 mL/min — ABNORMAL LOW (ref >60)
Glucose, Bld: 90 mg/dL (ref 70–99)
Potassium: 3.4 mmol/L — ABNORMAL LOW (ref 3.5–5.1)
Sodium: 143 mmol/L (ref 135–145)
Total Bilirubin: 0.8 mg/dL (ref 0.3–1.2)
Total Protein: 6.5 g/dL (ref 6.5–8.1)

## 2019-10-20 LAB — SARS CORONAVIRUS 2 (TAT 6-24 HRS): SARS Coronavirus 2: NEGATIVE

## 2019-10-20 LAB — CK: Total CK: 1257 U/L — ABNORMAL HIGH (ref 38–234)

## 2019-10-20 LAB — AMMONIA: Ammonia: 12 umol/L (ref 9–35)

## 2019-10-20 LAB — TSH: TSH: 1.299 u[IU]/mL (ref 0.350–4.500)

## 2019-10-20 IMAGING — DX DG CHEST 1V PORT
1 series · 1 of 1 positions shown · non-contrast
Comparison: Chest x-ray [DATE].

CLINICAL DATA: Altered mental status.

EXAM:
PORTABLE CHEST 1 VIEW

[chest]
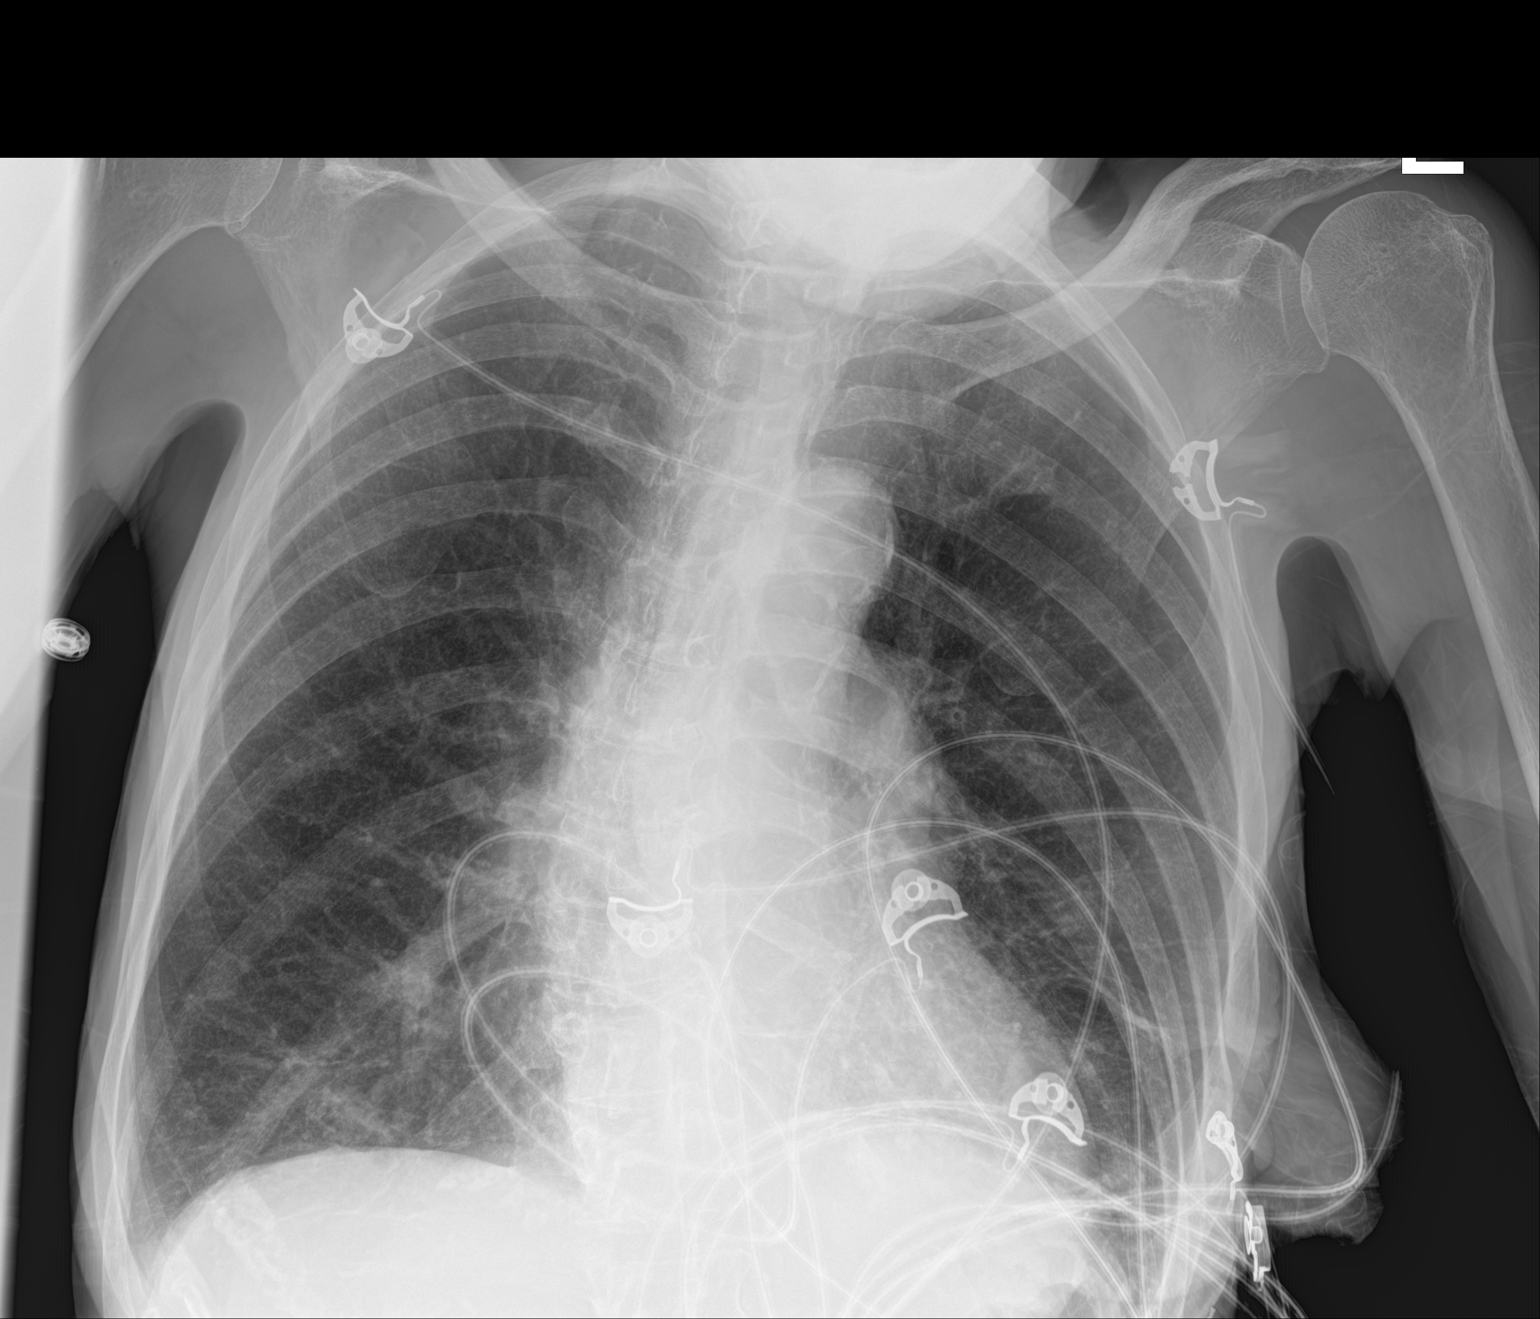

[1 of 1 positions shown; findings below may reference images not displayed]

FINDINGS: Mediastinum hilar structures normal. Heart size normal. Mild
bibasilar subsegmental atelectasis. No pleural effusion or
pneumothorax.
IMPRESSION: Mild bibasilar subsegmental atelectasis.

## 2019-10-20 IMAGING — CT CT HEAD W/O CM
4 series · 17 of 47 positions shown, 19 images · non-contrast
Comparison: None.

CLINICAL DATA: Speech difficulty

EXAM:
CT HEAD WITHOUT CONTRAST
TECHNIQUE: Contiguous axial images were obtained from the base of the skull
through the vertex without intravenous contrast.

[Series 3: head without · axial · non-contrast · 0.42mm/px · z∈[+236,+341]mm · 6 of 31 slices shown, 8 images]
[im 5/31  brain]
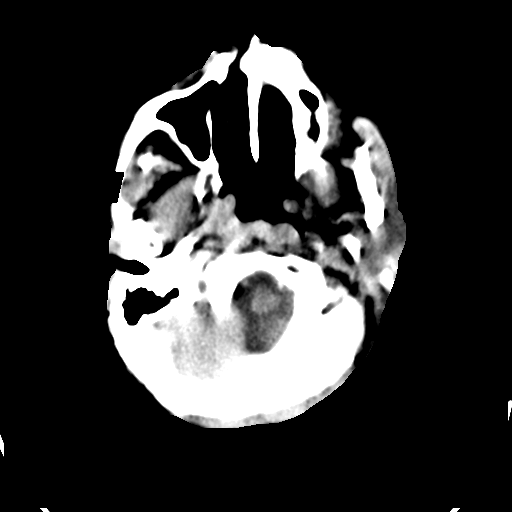
[im 5/31  bone]
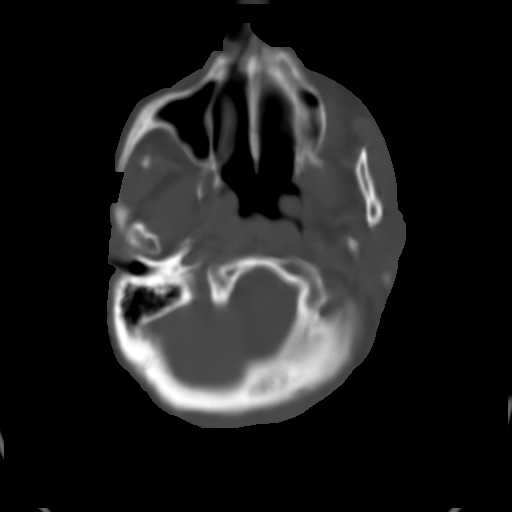
[im 9/31  brain]
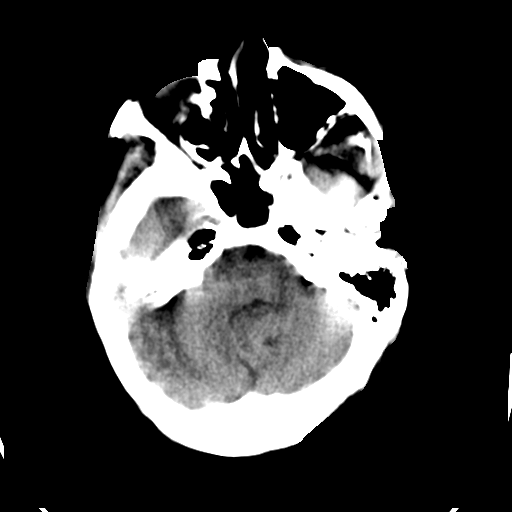
[im 13/31  brain]
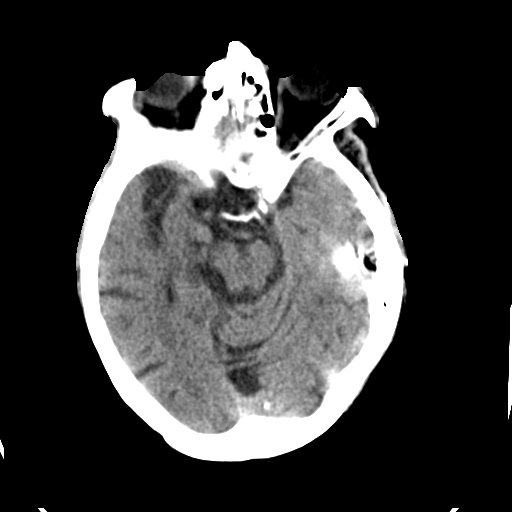
[im 18/31  brain]
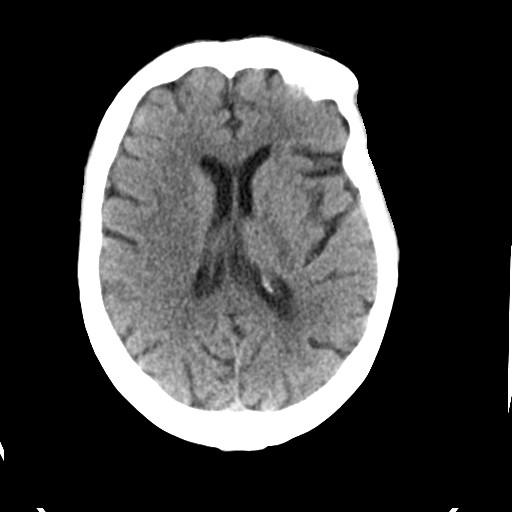
[im 22/31  brain]
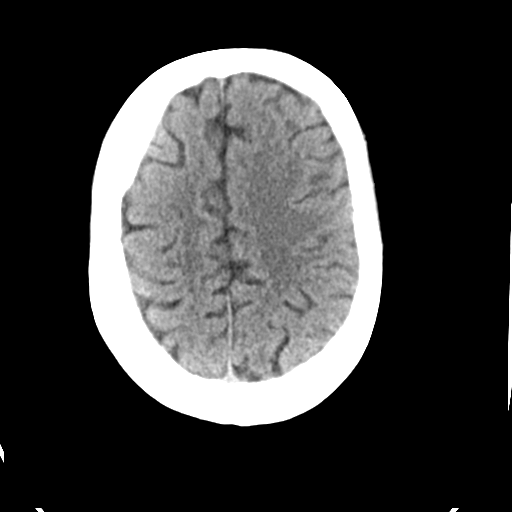
[im 22/31  bone]
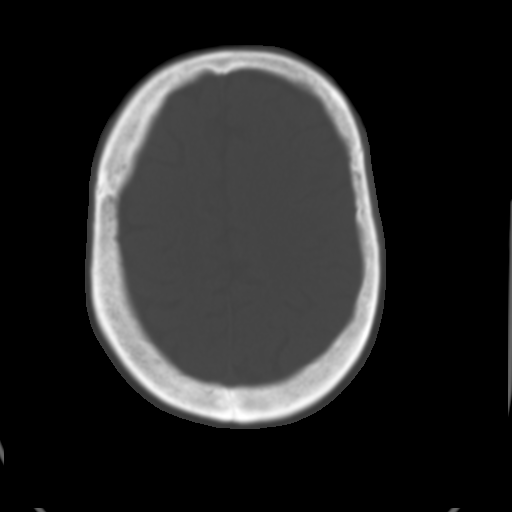
[im 26/31  brain]
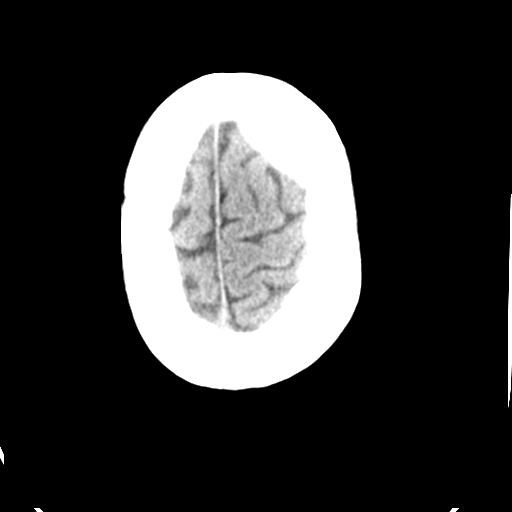

[Series 4: head bone · axial · 0.42mm/px · z∈[+230,+308]mm · 5 of 82 slices shown]
[im 8/82  bone]
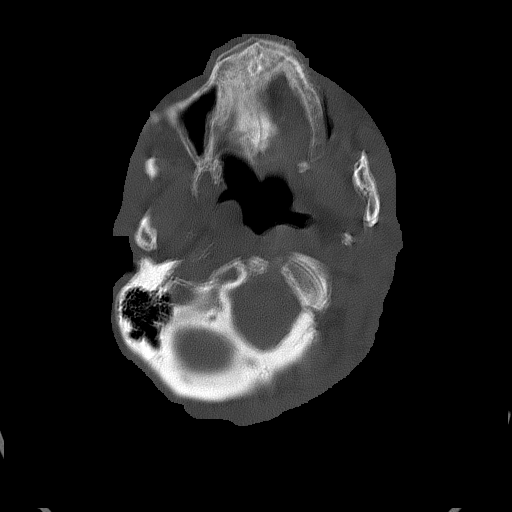
[im 16/82  bone]
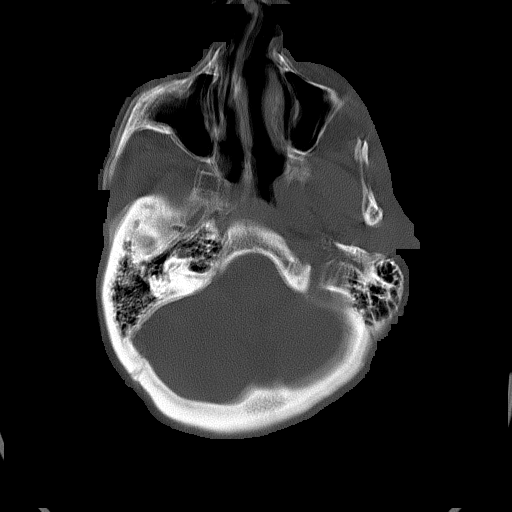
[im 28/82  bone]
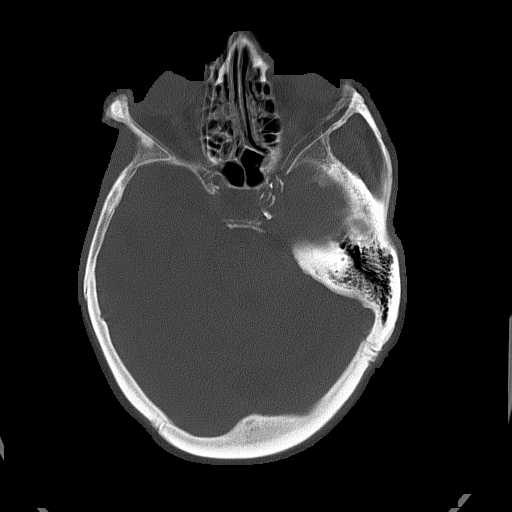
[im 35/82  bone]
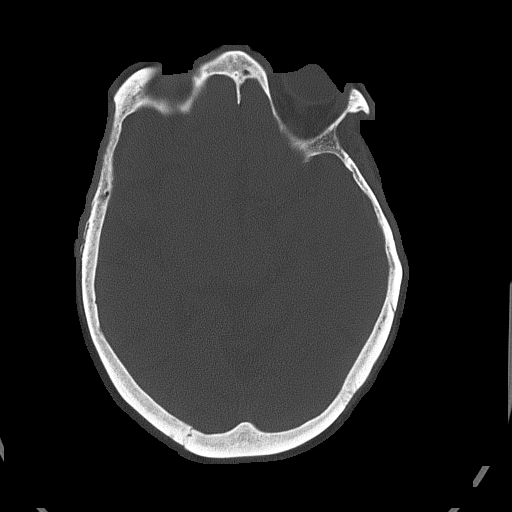
[im 47/82  bone]
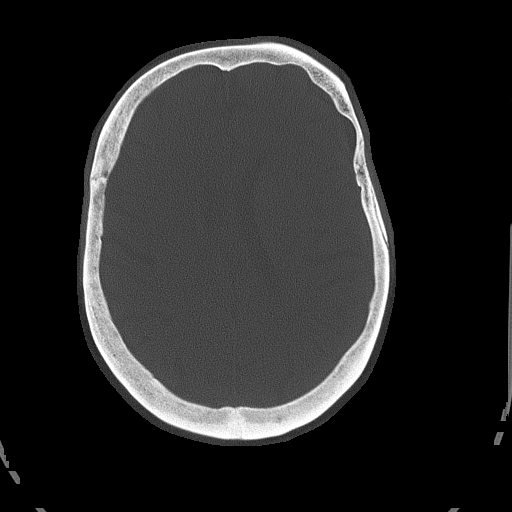

[Series 5: head without cor · coronal · non-contrast · 0.33mm/px · 3 of 67 slices shown]
[im 23/67  brain]
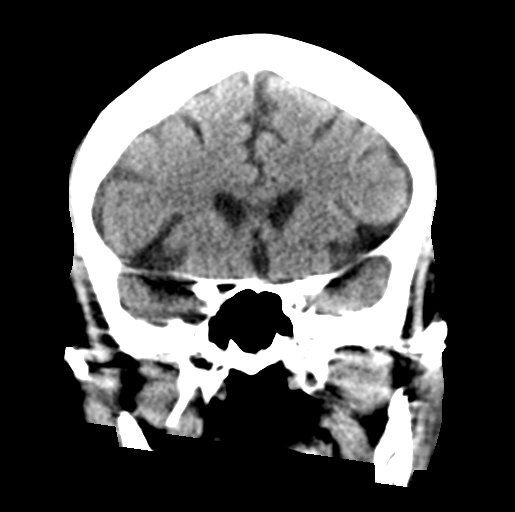
[im 30/67  brain]
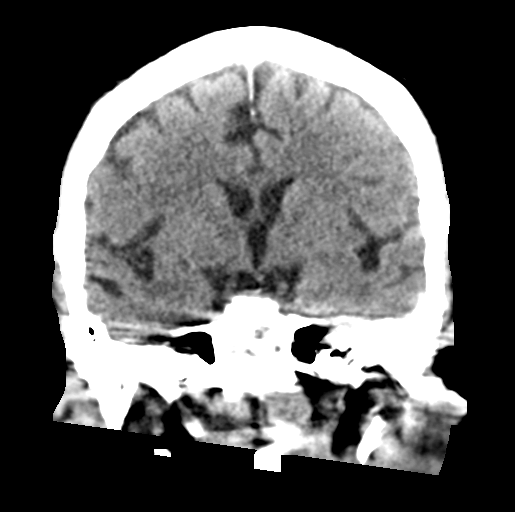
[im 37/67  brain]
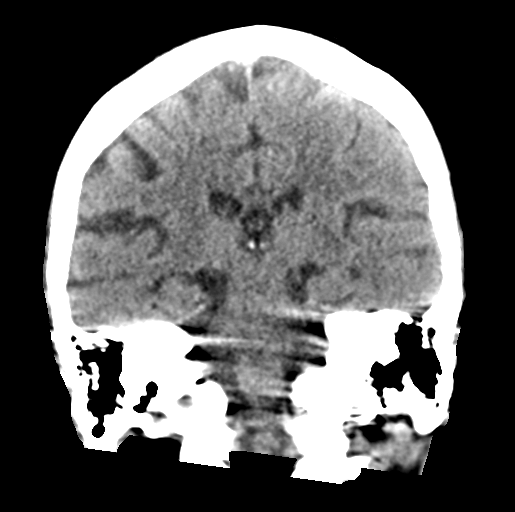

[Series 6: head without sag · sagittal · non-contrast · 0.35mm/px · 3 of 52 slices shown]
[im 18/52  brain]
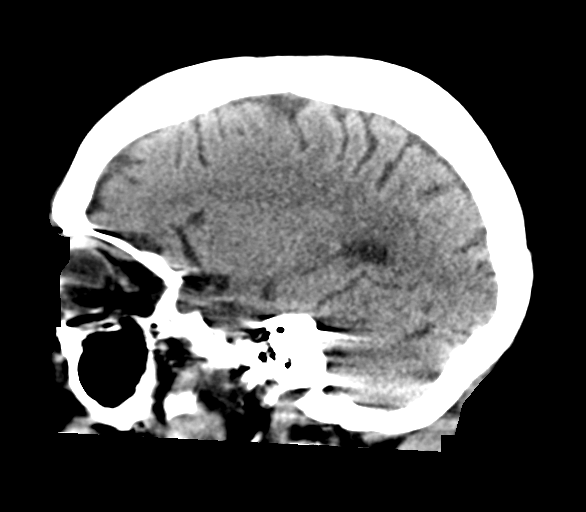
[im 26/52  brain]
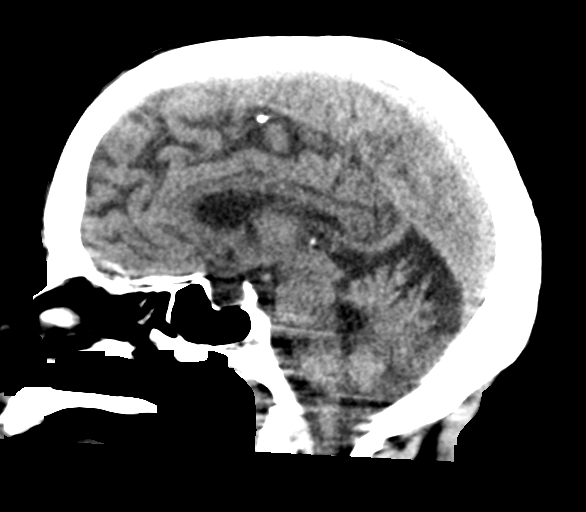
[im 34/52  brain]
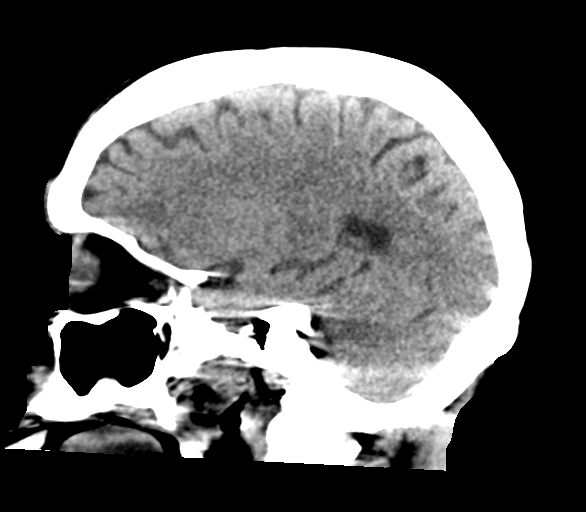

[17 of 47 positions shown; findings below may reference images not displayed]

FINDINGS: Motion artifact is present inferiorly through the skull base and
posterior fossa. Findings below are within this limitation.

Brain: There is no acute intracranial hemorrhage, mass-effect, or
edema. Gray-white differentiation is preserved. There is no
extra-axial fluid collection. Ventricles and sulci are within normal
limits in size and configuration.

Vascular: There is atherosclerotic calcification at the skull base.

Skull: Calvarium is unremarkable.

Sinuses/Orbits: No acute finding.

Other: None.
IMPRESSION: Partially motion degraded study. No acute intracranial hemorrhage,
mass effect, or evidence of acute infarction.

## 2019-10-20 IMAGING — MR MR HEAD W/O CM
15 series · 48 of 48 positions shown · non-contrast
Comparison: None.

CLINICAL DATA: Encephalopathy

EXAM:
MRI HEAD WITHOUT CONTRAST
TECHNIQUE: Multiplanar, multiecho pulse sequences of the brain and surrounding
structures were obtained without intravenous contrast.

[Series 5: DWI · axial · 3.0mm · 0.88mm/px · z∈[-127,+24]mm · 9 of 104 slices shown (1 of 6)]
[im 1/104]
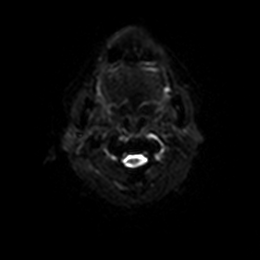
[im 13/104]
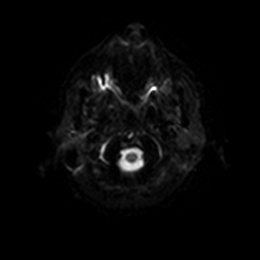
[im 26/104]
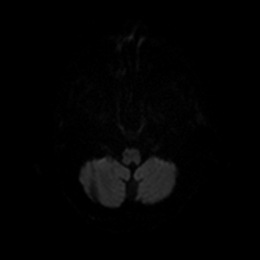
[im 39/104]
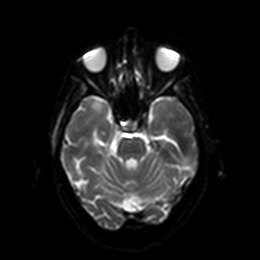
[im 52/104]
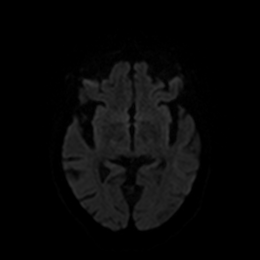
[im 65/104]
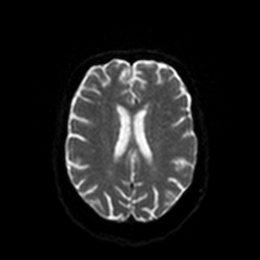
[im 78/104]
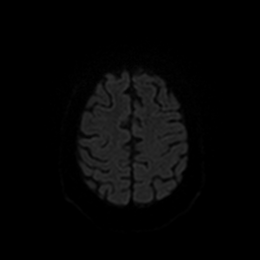
[im 91/104]
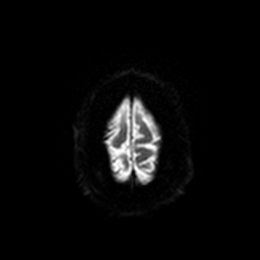
[im 104/104]
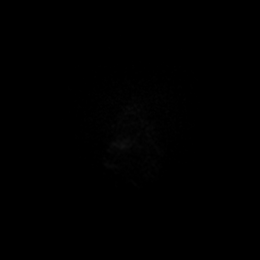

[Series 6: DWI · axial · 3.0mm · 0.88mm/px · z∈[-127,+24]mm · 4 of 52 slices shown (2 of 6)]
[im 1/52]
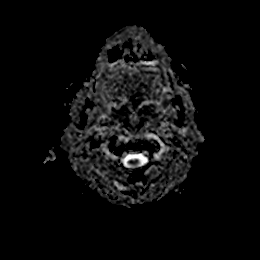
[im 18/52]
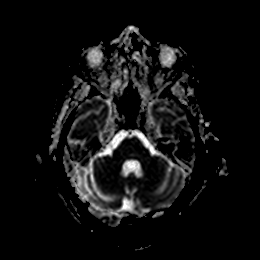
[im 35/52]
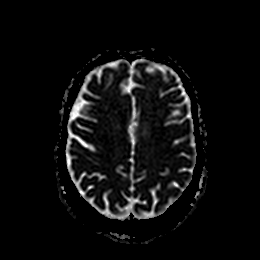
[im 52/52]
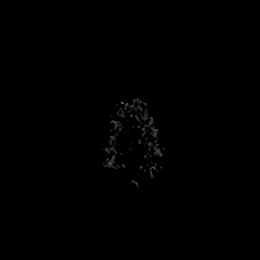

[Series 7: DWI · coronal · 4.0mm · 0.88mm/px · 2 of 30 slices shown (3 of 6)]
[im 1/30]
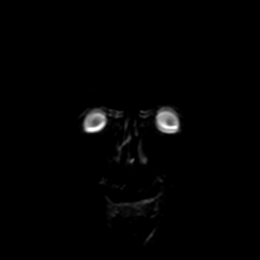
[im 30/30]
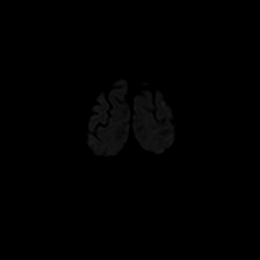

[Series 8: DWI · coronal · 4.0mm · 0.88mm/px · 1 of 14 slices shown (4 of 6)]
[im 1/14]
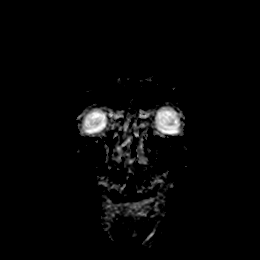

[Series 9: DWI · coronal · 4.0mm · 0.88mm/px · 5 of 68 slices shown (5 of 6)]
[im 1/68]
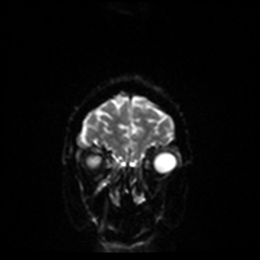
[im 17/68]
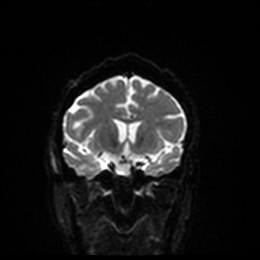
[im 34/68]
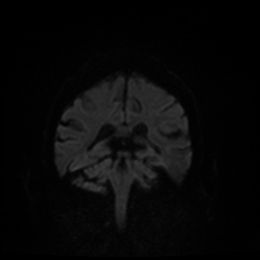
[im 51/68]
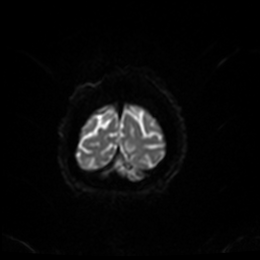
[im 68/68]
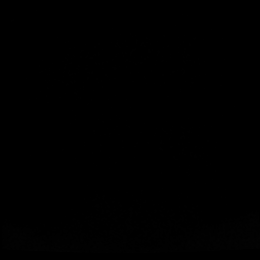

[Series 10: DWI · coronal · 4.0mm · 0.88mm/px · 2 of 31 slices shown (6 of 6)]
[im 1/31]
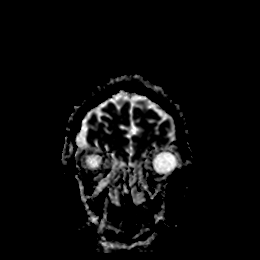
[im 31/31]
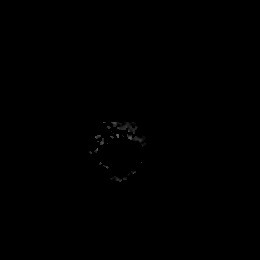

[Series 11: mag_images · axial · 3.0mm · 0.90mm/px · z∈[-93,+84]mm · 4 of 60 slices shown]
[im 1/60]
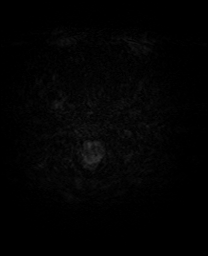
[im 20/60]
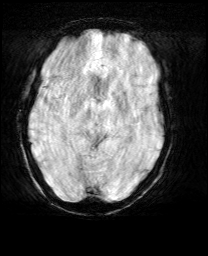
[im 40/60]
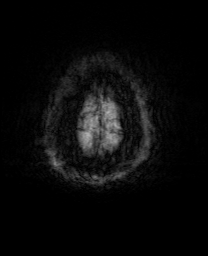
[im 60/60]
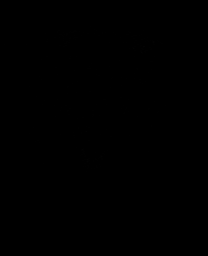

[Series 12: pha_images · axial · 3.0mm · 0.90mm/px · z∈[-90,+81]mm · 4 of 51 slices shown]
[im 1/51]
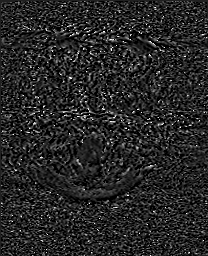
[im 17/51]
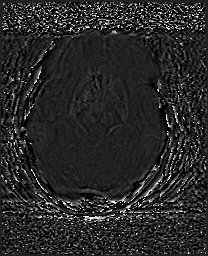
[im 34/51]
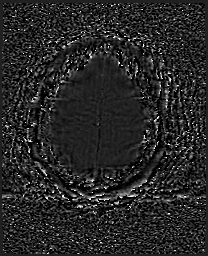
[im 51/51]
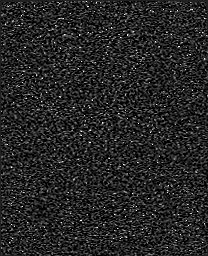

[Series 13: swi_images · axial · 3.0mm · 0.90mm/px · z∈[-93,+84]mm · 4 of 59 slices shown]
[im 1/59]
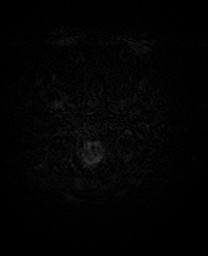
[im 20/59]
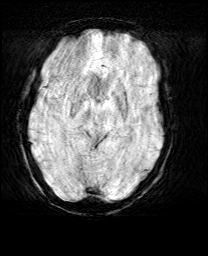
[im 39/59]
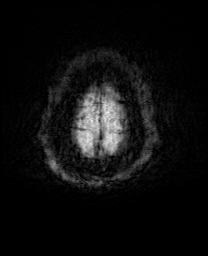
[im 59/59]
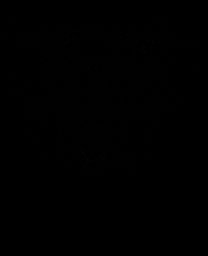

[Series 14: mip_images(sw) · axial · 24.0mm · 0.90mm/px · z∈[-82,+74]mm · 4 of 53 slices shown]
[im 1/53]
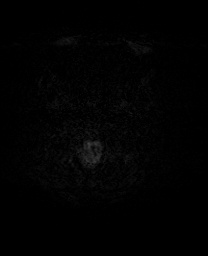
[im 18/53]
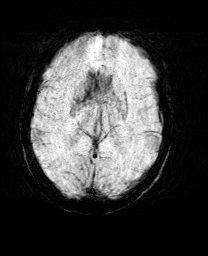
[im 35/53]
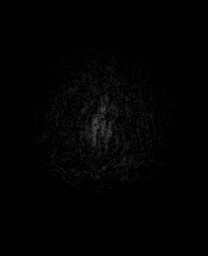
[im 53/53]
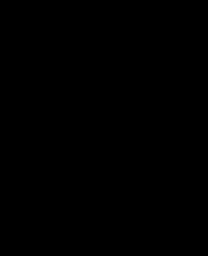

[Series 15: T1 · sagittal · 5.0mm · 0.75mm/px · 1 of 20 slices shown]
[im 1/20]
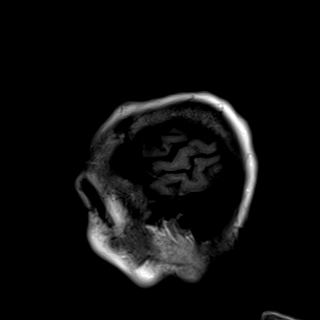

[Series 16: T2 · axial · 5.0mm · 0.72mm/px · z∈[-128,+14]mm · 2 of 25 slices shown (1 of 2)]
[im 1/25]
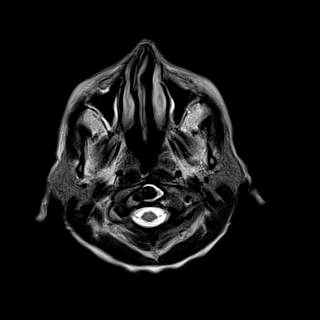
[im 25/25]
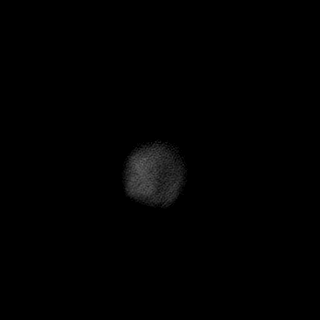

[Series 17: FLAIR · axial · 5.0mm · 0.90mm/px · z∈[-132,+11]mm · 2 of 25 slices shown (1 of 2)]
[im 1/25]
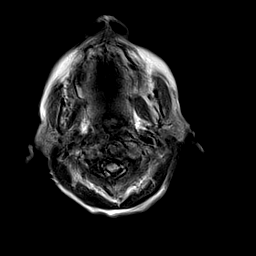
[im 25/25]
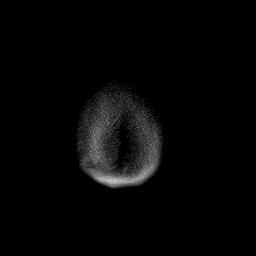

[Series 18: FLAIR · axial · 5.0mm · 0.90mm/px · z∈[-128,+14]mm · 2 of 25 slices shown (2 of 2)]
[im 1/25]
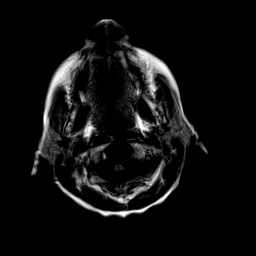
[im 25/25]
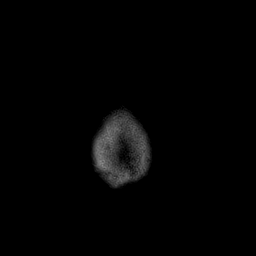

[Series 19: T2 · coronal · 5.0mm · 0.72mm/px · 2 of 29 slices shown (2 of 2)]
[im 1/29]
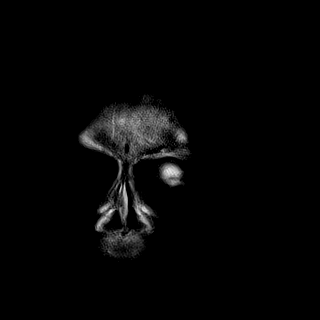
[im 29/29]
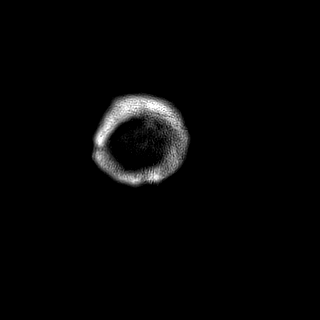

[48 of 48 positions shown; findings below may reference images not displayed]

FINDINGS: BRAIN: There is no acute infarct, acute hemorrhage or extra-axial
collection. The white matter signal is normal for the patient's age.
The cerebral and cerebellar volume are age-appropriate. There is no
hydrocephalus. The midline structures are normal.

VASCULAR: The major intracranial arterial and venous sinus flow
voids are normal. Susceptibility-sensitive sequences show no chronic
microhemorrhage or superficial siderosis.

SKULL AND UPPER CERVICAL SPINE: Calvarial bone marrow signal is
normal. There is no skull base mass. The visualized upper cervical
spine and soft tissues are normal.

SINUSES/ORBITS: There are no fluid levels or advanced mucosal
thickening. The mastoid air cells and middle ear cavities are free
of fluid. The orbits are normal.
IMPRESSION: Normal MRI of the brain.

## 2019-10-20 MED ORDER — STROKE: EARLY STAGES OF RECOVERY BOOK
Freq: Once | Status: AC
  Filled 2019-10-20: qty 1

## 2019-10-20 MED ORDER — ACETAMINOPHEN 650 MG RE SUPP: 650.0000 mg | RECTAL | Status: DC | PRN

## 2019-10-20 MED ORDER — ACETAMINOPHEN 325 MG PO TABS: 650.0000 mg | ORAL_TABLET | ORAL | Status: DC | PRN

## 2019-10-20 MED ORDER — ATENOLOL 25 MG PO TABS
50.0000 mg | ORAL_TABLET | Freq: Two times a day (BID) | ORAL | Status: DC
  Administered 2019-10-21 – 2019-10-22 (×3): 50 mg via ORAL
  Filled 2019-10-20 (×3): qty 1
  Filled 2019-10-20 (×3): qty 2

## 2019-10-20 MED ORDER — IPRATROPIUM-ALBUTEROL 0.5-2.5 (3) MG/3ML IN SOLN: 3.0000 mL | Freq: Four times a day (QID) | RESPIRATORY_TRACT | Status: DC | PRN

## 2019-10-20 MED ORDER — HEPARIN SODIUM (PORCINE) 5000 UNIT/ML IJ SOLN
5000.0000 [IU] | Freq: Three times a day (TID) | INTRAMUSCULAR | Status: DC
  Administered 2019-10-20 – 2019-10-22 (×6): 5000 [IU] via SUBCUTANEOUS
  Filled 2019-10-20 (×6): qty 1

## 2019-10-20 MED ORDER — ACETAMINOPHEN 160 MG/5ML PO SOLN: 650.0000 mg | ORAL | Status: DC | PRN

## 2019-10-20 NOTE — Consult Note (Addendum)
Neurology Consultation  Reason for Consult: Aphasia Referring Physician: Roderic Palau  CC: Aphasia  History is obtained from: Chart  HPI: Bethany [name] is a 73 y.o. female with history of pneumonia, hypertension, hyperlipidemia, heart murmur, chronic kidney disease with dialysis on Tuesdays, Thursdays, Saturdays and anemia.  Patient lives alone.  Per notes apparently patient is typically alert and oriented however this is very different than her baseline.  Her friends and her typically have lunch on Mondays and Wednesdays, but today after she did not show they went to evaluate to see if she was okay.  They found her on the floor.  She was last known normal on talk to 2 days ago.  Per note, her Sister [name], had noted that she actually drives herself to and from dialysis.  When asking the patient if she did go to dialysis on Saturday her response was "no".  When asked if patient felt as though she was confused she answered "yes".   ED course-labs, CT head Important labs that were obtained while in the ED include: -Potassium 3.4 -Chloride 93 -Creatinine 11.02 -BUN 53 -CK total 1257  Last known well was 10/18/2019 Unknown time Lives alone and provides for herself.   Past Medical History:  Diagnosis Date  . Anemia   . Chronic kidney disease   . Heart murmur    as a child  . Hernia, femoral    left  . Hyperlipidemia    no meds aken  . Hypertension   . Pneumonia     Family History  Problem Relation Age of Onset  . Heart attack Brother   . Breast cancer Other   . Skin cancer Other   . Colon cancer Neg Hx   . Pancreatic cancer Neg Hx   . Stomach cancer Neg Hx   . Esophageal cancer Neg Hx   . Rectal cancer Neg Hx     Social History:   reports that she has been smoking cigarettes. She has been smoking about 0.25 packs per day. She has never used smokeless tobacco. She reports current alcohol use of about 7.0 standard drinks of alcohol per week. She reports that she  does not use drugs.  Medications No current facility-administered medications for this encounter.  Current Outpatient Medications:  .  amoxicillin (AMOXIL) 500 MG capsule, Take 500 mg by mouth 2 (two) times daily., Disp: , Rfl:  .  atenolol (TENORMIN) 50 MG tablet, Take 50 mg by mouth 2 (two) times daily. , Disp: , Rfl: 5 .  beta carotene 25000 UNIT capsule, Take 25,000-50,000 Units by mouth daily., Disp: , Rfl:  .  BIOTIN PO, Take 1,000-1,500 mcg by mouth daily. , Disp: , Rfl:  .  calcium acetate (PHOSLO) 667 MG capsule, Take 667-1,334 mg by mouth See admin instructions. Take 2 capsules by mouth three times a day with meals and take 1 capsule twice a day with snacks., Disp: , Rfl:  .  Cholecalciferol (VITAMIN D) 2000 units tablet, Take 4,000-6,000 Units by mouth daily., Disp: , Rfl:  .  COMBIVENT RESPIMAT 20-100 MCG/ACT AERS respimat, Inhale 1-2 puffs into the lungs every 6 (six) hours as needed for shortness of breath., Disp: , Rfl: 11 .  Flaxseed, Linseed, (FLAX SEED OIL) 1000 MG CAPS, Take 1,000 mg by mouth daily., Disp: , Rfl:  .  folic acid (FOLVITE) 706 MCG tablet, Take 400-800 mcg by mouth daily., Disp: , Rfl:  .  Krill Oil 350 MG CAPS, Take 350 mg by mouth daily.,  Disp: , Rfl:  .  multivitamin (RENA-VIT) TABS tablet, Take 1 tablet by mouth daily., Disp: , Rfl:  .  oxyCODONE-acetaminophen (PERCOCET) 10-325 MG tablet, Take 1 tablet by mouth 2 (two) times daily., Disp: , Rfl:  .  REQUIP 0.25 MG tablet, Take 0.25 mg by mouth at bedtime as needed. Restless legs, Disp: , Rfl:  .  temazepam (RESTORIL) 15 MG capsule, Take 15 mg by mouth at bedtime as needed for sleep., Disp: , Rfl:  .  vitamin B-12 (CYANOCOBALAMIN) 500 MCG tablet, Take 1,000 mcg by mouth daily., Disp: , Rfl:    Exam: Current vital signs: BP (!) 116/91   Pulse 88   Temp 98.2 F (36.8 C) (Oral)   Resp (!) 30   Ht $R'5\' 2"'qO$  (1.575 m)   Wt 49 kg   SpO2 98%   BMI 19.76 kg/m  Vital signs in last 24 hours: Temp:  [98.2 F  (36.8 C)] 98.2 F (36.8 C) (12/23 1213) Pulse Rate:  [62-88] 88 (12/23 1645) Resp:  [11-31] 30 (12/23 1645) BP: (116-198)/(53-94) 116/91 (12/23 1630) SpO2:  [94 %-98 %] 98 % (12/23 1645) Weight:  [49 kg] 49 kg (12/23 1214)  ROS: Unable to get a full detailed review of system due to patient's encephalopathy    Physical Exam   Constitutional: Cachectic.  Psych: Encephalopathic Eyes: No scleral injection HENT: No OP obstrucion Head: Normocephalic.  Cardiovascular: Normal rate and regular rhythm.  Respiratory: Effort normal, non-labored breathing GI: Soft.  No distension. There is no tenderness.  Skin: WDI  Neuro: Mental Status: Patient at this time is awake, attempts to follow commands and can do so with simple commands such as raising her arm, showing me her thumb, counting fingers.  She was also able to state her name without any difficulty.  She is unable to give a clear coherent history.  Patient is able to give 1 word answers, at times perseverates on a word however over time can breakthrough this and tell the correct answer.  Able to repeat however does make paraphasic errors at times Cranial Nerves: II: Visual Fields are full.  III,IV, VI: EOMI without ptosis or diploplia. Pupils equal, round and reactive to light V: Facial sensation is symmetric to temperature VII: Facial movement is symmetric.  VIII: hearing is intact to voice XII: tongue is midline without atrophy or fasciculations.  Motor: Difficult to assess, but is moving all extremities antigravity.  She is showing both positive myoclonus at rest and negative myoclonus when arms and legs are held antigravity Sensory: Sensation is symmetric to light touch Deep Tendon Reflexes: 2+ and symmetric in the biceps and patellae.  Plantars: Toes are downgoing bilaterally.  Cerebellar: Patient does show dysmetria to finger-nose however this is due to asterixis  Labs I have reviewed labs in epic and the results pertinent  to this consultation are:   CBC    Component Value Date/Time   WBC 7.6 10/20/2019 1435   RBC 3.26 (L) 10/20/2019 1435   HGB 10.6 (L) 10/20/2019 1435   HCT 32.8 (L) 10/20/2019 1435   PLT 334 10/20/2019 1435   MCV 100.6 (H) 10/20/2019 1435   MCH 32.5 10/20/2019 1435   MCHC 32.3 10/20/2019 1435   RDW 15.6 (H) 10/20/2019 1435   LYMPHSABS 1.5 10/20/2019 1435   MONOABS 1.1 (H) 10/20/2019 1435   EOSABS 0.0 10/20/2019 1435   BASOSABS 0.0 10/20/2019 1435    CMP     Component Value Date/Time   NA 143 10/20/2019 1435  K 3.4 (L) 10/20/2019 1435   CL 93 (L) 10/20/2019 1435   CO2 32 10/20/2019 1435   GLUCOSE 90 10/20/2019 1435   BUN 53 (H) 10/20/2019 1435   CREATININE 11.02 (H) 10/20/2019 1435   CALCIUM 9.1 10/20/2019 1435   PROT 6.5 10/20/2019 1435   ALBUMIN 3.2 (L) 10/20/2019 1435   AST 63 (H) 10/20/2019 1435   ALT 28 10/20/2019 1435   ALKPHOS 71 10/20/2019 1435   BILITOT 0.8 10/20/2019 1435   GFRNONAA 3 (L) 10/20/2019 1435   GFRAA 4 (L) 10/20/2019 1435    Lipid Panel  No results found for: CHOL, TRIG, HDL, CHOLHDL, VLDL, LDLCALC, LDLDIRECT   Imaging I have reviewed the images obtained:  CT-scan of the brain-partially motion degraded.  No acute intracranial hemorrhage, mass-effect and/or evidence of acute infarction.   Etta Quill PA-C [facility] [phone]  M-F  (9:00 am- 5:00 PM)  10/20/2019, 5:13 PM   I have seen the patient reviewed the above note.  On exam, she is slow to answer and confused but not clearly aphasic.  She has prominent asterixis.  Assessment:  This 73 year old female who was last seen normal 2 days ago.  Patient presents to the hospital with confusion, word perseveration, however able to give some history and follow some commands.  No problem given patient's clinical presentation with asterixis, confusion,  metabolic encephalopathy and she will need further evaluation. Narcotics can certainly cause asterixis as well and I wonder  if her pain meds could be contributing.   Recommendations: -MRI brain without contrast -confirm if she has missed dialysis.  -Limit and/or DC narcotics as this can worsen patient's encephalopathy and asterixis. -Would stop Requip for the time being  - neurology will follow.   Roland Rack, MD [facility] [phone]  If 7pm- 7am, please page neurology on call as listed in [place].

## 2019-10-20 NOTE — ED Triage Notes (Signed)
Pt arrived via [facility] from home. Pt lives by self and has breakfast with friends every other day. Friend's husband went to pt's house when she didn;t show up for breakfast and was found on floor in AMS. Pt normally A&Ox4. Pt has dialysis Tues, Thurs, Sat, unknown when went to dialysis last. Pt having aphasia. LKW Monday at 0730. Pt A&Ox1 to self only.

## 2019-10-20 NOTE — ED Notes (Signed)
ED TO INPATIENT HANDOFF REPORT  ED Nurse Name and Phone #: Percival Spanish [phone]  S Name/Age/Gender Bethany [name] 73 y.o. female Room/Bed: 047C/047C  Code Status   Code Status: Not on file  Home/SNF/Other Home Patient oriented to: self Is this baseline? No   Triage Complete: Triage complete  Chief Complaint Encephalopathy, metabolic [G93.41]  Triage Note Pt arrived via [facility] from home. Pt lives by self and has breakfast with friends every other day. Friend's husband went to pt's house when she didn;t show up for breakfast and was found on floor in AMS. Pt normally A&Ox4. Pt has dialysis Tues, Thurs, Sat, unknown when went to dialysis last. Pt having aphasia. LKW Monday at 0730. Pt A&Ox1 to self only.     Allergies No Known Allergies  Level of Care/Admitting Diagnosis ED Disposition    ED Disposition Condition Comment   Admit  Hospital Area: [facility] [[id number]]  Level of Care: Telemetry Medical [104]  Covid Evaluation: Asymptomatic Screening Protocol (No Symptoms)  Diagnosis: Encephalopathy, metabolic [163922]  Admitting Physician: MCDIARMID, TODD D [[id number]]  Attending Physician: MCDIARMID, TODD D [[id number]]  Estimated length of stay: past midnight tomorrow  Certification:: I certify this patient will need inpatient services for at least 2 midnights       B Medical/Surgery History Past Medical History:  Diagnosis Date  . Anemia   . Chronic kidney disease   . Heart murmur    as a child  . Hernia, femoral    left  . Hyperlipidemia    no meds aken  . Hypertension   . Pneumonia    Past Surgical History:  Procedure Laterality Date  . AV FISTULA PLACEMENT Left 08/14/2018   Procedure: INSERTION OF ARTERIOVENOUS GRAFT LEFT ARM;  Surgeon: Nada Libman, MD;  Location: [facility] OR;  Service: Vascular;  Laterality: Left;  . COLONOSCOPY    . TONSILLECTOMY AND ADENOIDECTOMY     at age 57- unsure if adenoids were taken out     A IV  Location/Drains/Wounds Patient Lines/Drains/Airways Status   Active Line/Drains/Airways    Name:   Placement date:   Placement time:   Site:   Days:   Peripheral IV 10/20/19 Right Antecubital   10/20/19    1255    Antecubital   less than 1   Peripheral IV 10/20/19 Right Antecubital   10/20/19    1804    Antecubital   less than 1   Fistula / Graft Left Upper arm Arteriovenous vein graft   08/14/18    0822    Upper arm   432   Incision (Closed) 08/14/18 Arm Left   08/14/18    0822     432          Intake/Output Last 24 hours No intake or output data in the 24 hours ending 10/20/19 1808  Labs/Imaging Results for orders placed or performed during the hospital encounter of 10/20/19 (from the past 48 hour(s))  CBC with Differential     Status: Abnormal   Collection Time: 10/20/19  2:35 PM  Result Value Ref Range   WBC 7.6 4.0 - 10.5 K/uL   RBC 3.26 (L) 3.87 - 5.11 MIL/uL   Hemoglobin 10.6 (L) 12.0 - 15.0 g/dL   HCT 94.1 (L) 36.7 - 37.2 %   MCV 100.6 (H) 80.0 - 100.0 fL   MCH 32.5 26.0 - 34.0 pg   MCHC 32.3 30.0 - 36.0 g/dL   RDW 84.3 (H) 10.8 - 59.9 %  Platelets 334 150 - 400 K/uL   nRBC 0.0 0.0 - 0.2 %   Neutrophils Relative % 64 %   Neutro Abs 4.9 1.7 - 7.7 K/uL   Lymphocytes Relative 20 %   Lymphs Abs 1.5 0.7 - 4.0 K/uL   Monocytes Relative 14 %   Monocytes Absolute 1.1 (H) 0.1 - 1.0 K/uL   Eosinophils Relative 1 %   Eosinophils Absolute 0.0 0.0 - 0.5 K/uL   Basophils Relative 1 %   Basophils Absolute 0.0 0.0 - 0.1 K/uL   Immature Granulocytes 0 %   Abs Immature Granulocytes 0.02 0.00 - 0.07 K/uL    Comment: Performed at [facility] [address] [place], [place] [address]  Comprehensive metabolic panel     Status: Abnormal   Collection Time: 10/20/19  2:35 PM  Result Value Ref Range   Sodium 143 135 - 145 mmol/L   Potassium 3.4 (L) 3.5 - 5.1 mmol/L   Chloride 93 (L) 98 - 111 mmol/L   CO2 32 22 - 32 mmol/L   Glucose, Bld 90 70 - 99 mg/dL   BUN 53 (H) 8 - 23  mg/dL   Creatinine, Ser 11.02 (H) 0.44 - 1.00 mg/dL   Calcium 9.1 8.9 - 10.3 mg/dL   Total Protein 6.5 6.5 - 8.1 g/dL   Albumin 3.2 (L) 3.5 - 5.0 g/dL   AST 63 (H) 15 - 41 U/L   ALT 28 0 - 44 U/L   Alkaline Phosphatase 71 38 - 126 U/L   Total Bilirubin 0.8 0.3 - 1.2 mg/dL   GFR calc non Af Amer 3 (L) >60 mL/min   GFR calc Af Amer 4 (L) >60 mL/min   Anion gap 18 (H) 5 - 15    Comment: Performed at [facility], [place] [address], [place], [place] [address]  CK     Status: Abnormal   Collection Time: 10/20/19  2:35 PM  Result Value Ref Range   Total CK 1,257 (H) 38 - 234 U/L    Comment: Performed at [facility], [place] [address], [place], [place] [address]  Troponin I (High Sensitivity)     Status: Abnormal   Collection Time: 10/20/19  2:35 PM  Result Value Ref Range   Troponin I (High Sensitivity) 35 (H) <18 ng/L    Comment: (NOTE) Elevated high sensitivity troponin I (hsTnI) values and significant  changes across serial measurements may suggest ACS but many other  chronic and acute conditions are known to elevate hsTnI results.  Refer to the "Links" section for chest pain algorithms and additional  guidance. Performed at [facility], [place] [address], [place], [place] [address]   Troponin I (High Sensitivity)     Status: Abnormal   Collection Time: 10/20/19  4:06 PM  Result Value Ref Range   Troponin I (High Sensitivity) 33 (H) <18 ng/L    Comment: (NOTE) Elevated high sensitivity troponin I (hsTnI) values and significant  changes across serial measurements may suggest ACS but many other  chronic and acute conditions are known to elevate hsTnI results.  Refer to the "Links" section for chest pain algorithms and additional  guidance. Performed at [facility], [place] [address] [place], [place] [address]    CT Head Wo Contrast  Result Date: 10/20/2019 CLINICAL DATA:  Speech difficulty EXAM: CT HEAD WITHOUT CONTRAST TECHNIQUE: Contiguous axial images  were obtained from the base of the skull through the vertex without intravenous  contrast. COMPARISON:  None. FINDINGS: Motion artifact is present inferiorly through the skull base and posterior fossa. Findings below are within this limitation. Brain: There is no acute intracranial hemorrhage, mass-effect, or edema. Gray-white differentiation is preserved. There is no extra-axial fluid collection. Ventricles and sulci are within normal limits in size and configuration. Vascular: There is atherosclerotic calcification at the skull base. Skull: Calvarium is unremarkable. Sinuses/Orbits: No acute finding. Other: None. IMPRESSION: Partially motion degraded study. No acute intracranial hemorrhage, mass effect, or evidence of acute infarction. Electronically Signed   By: Guadlupe Spanish M.D.   On: 10/20/2019 15:49   DG Chest Portable 1 View  Result Date: 10/20/2019 CLINICAL DATA:  Altered mental status. EXAM: PORTABLE CHEST 1 VIEW COMPARISON:  Chest x-ray 10/28/2015. FINDINGS: Mediastinum hilar structures normal. Heart size normal. Mild bibasilar subsegmental atelectasis. No pleural effusion or pneumothorax. IMPRESSION: Mild bibasilar subsegmental atelectasis. Electronically Signed   By: Maisie Fus  Register   On: 10/20/2019 14:52    Pending Labs Unresulted Labs (From admission, onward)    Start     Ordered   10/20/19 1719  SARS CORONAVIRUS 2 (TAT 6-24 HRS) Nasopharyngeal Nasopharyngeal Swab  (Tier 3 (TAT 6-24 hrs))  Once,   STAT    Question Answer Comment  Is this test for diagnosis or screening Screening   Symptomatic for COVID-19 as defined by CDC No   Hospitalized for COVID-19 No   Admitted to ICU for COVID-19 No   Previously tested for COVID-19 No   Resident in a congregate (group) care setting No   Employed in healthcare setting No   Pregnant No      10/20/19 1718   10/20/19 1659  Ammonia  ONCE - STAT,   STAT     10/20/19 1658   10/20/19 1239  Urinalysis, Routine w reflex microscopic  Once,   STAT      10/20/19 1238          Vitals/Pain Today's Vitals   10/20/19 1603 10/20/19 1613 10/20/19 1630 10/20/19 1645  BP:  (!) 141/74 (!) 116/91   Pulse: 73   88  Resp: (!) 22 19 17  (!) 30  Temp:      TempSrc:      SpO2: 96%   98%  Weight:      Height:        Isolation Precautions No active isolations  Medications Medications - No data to display  Mobility walks     Focused Assessments Neuro Assessment Handoff:  Swallow screen pass? No    NIH Stroke Scale ( + Modified Stroke Scale Criteria)  Interval: Initial Level of Consciousness (1a.)   : Alert, keenly responsive LOC Questions (1b. )   +: Answers one question correctly LOC Commands (1c. )   + : Performs both tasks correctly Best Gaze (2. )  +: Normal Visual (3. )  +: (unable to assess, pt not understanding) Facial Palsy (4. )    : Normal symmetrical movements Motor Arm, Left (5a. )   +: Drift(Pt not understanding) Motor Arm, Right (5b. )   +: Drift(pt not understanding) Motor Leg, Left (6a. )   +: Drift(pt not understanding) Motor Leg, Right (6b. )   +: Drift(pt not understanding) Limb Ataxia (7. ): (pt not understanding) Sensory (8. )   +: (pt not understanding and won't answer) Best Language (9. )   +: No aphasia Dysarthria (10. ): Normal Extinction/Inattention (11.)   +: No Abnormality Last date known well: 10/18/19 Last time known well:  0730 Neuro Assessment: Exceptions to WDL Neuro Checks:   Initial (10/20/19 1200)  Last Documented NIHSS Modified Score:   Has TPA been given? No If patient is a Neuro Trauma and patient is going to OR before floor call report to [facility] nurse: [phone] or [phone]     R Recommendations: See Admitting Provider Note  Report given to:   Additional Notes:

## 2019-10-20 NOTE — ED Provider Notes (Signed)
[facility] EMERGENCY DEPARTMENT Provider Note   CSN: [id number] Arrival date & time: 10/20/19  1159     History Chief Complaint  Patient presents with  . Altered Mental Status    Bethany [name] is a 73 y.o. female with PMH significant for ESRD on dialysis Tuesdays, Thursdays, and Saturdays who presents to the ED from home via EMS for AMS.  EMS obtained history from patient's friends who report that she typically is alert and oriented and that she is vastly different from her baseline.  They typically will get lunch together on Mondays and Wednesdays, but today after she did not show they went to her house and found her on the floor.  For that reason, last known normal is 2 days ago.  On my exam, patient is seemingly understanding my questions but with severely limited expressive aphasia.  Level 5 caveat due to her aphasia and confusion.   Obtained additional history from her sister, Bethany [name], who works at [employer].  She reports that patient had a fall on 10/11/2019 and was initially confused, but was able to recover without any obvious deficits and insisted on continuing to live alone as she is very independent.  She would even drive herself to and from dialysis.   HPI     Past Medical History:  Diagnosis Date  . Anemia   . Chronic kidney disease   . Heart murmur    as a child  . Hernia, femoral    left  . Hyperlipidemia    no meds aken  . Hypertension   . Pneumonia     Patient Active Problem List   Diagnosis Date Noted  . ESRD on dialysis ([place]) 08/03/2018  . Femoral hernia 06/30/2013    Past Surgical History:  Procedure Laterality Date  . AV FISTULA PLACEMENT Left 08/14/2018   Procedure: INSERTION OF ARTERIOVENOUS GRAFT LEFT ARM;  Surgeon: Serafina Mitchell, MD;  Location: [facility] OR;  Service: Vascular;  Laterality: Left;  . COLONOSCOPY    . TONSILLECTOMY AND ADENOIDECTOMY     at age 40- unsure if adenoids were taken out     OB  History   No obstetric history on file.     Family History  Problem Relation Age of Onset  . Heart attack Brother   . Breast cancer Other   . Skin cancer Other   . Colon cancer Neg Hx   . Pancreatic cancer Neg Hx   . Stomach cancer Neg Hx   . Esophageal cancer Neg Hx   . Rectal cancer Neg Hx     Social History   Tobacco Use  . Smoking status: Current Every Day Smoker    Packs/day: 0.25    Types: Cigarettes  . Smokeless tobacco: Never Used  Substance Use Topics  . Alcohol use: Yes    Alcohol/week: 7.0 standard drinks    Types: 7 Glasses of wine per week    Comment: occasional  . Drug use: No    Comment: marijuana     Home Medications Prior to Admission medications   Medication Sig Start Date End Date Taking? Authorizing Provider  amoxicillin (AMOXIL) 500 MG capsule Take 500 mg by mouth 2 (two) times daily. 10/08/19   [provider]  atenolol (TENORMIN) 50 MG tablet Take 50 mg by mouth 2 (two) times daily.  05/21/18   [provider]  beta carotene 25000 UNIT capsule Take 25,000-50,000 Units by mouth daily.    [provider]  BIOTIN PO Take 1,000-1,500 mcg by mouth daily.     [provider]  calcium acetate (PHOSLO) 667 MG capsule Take 667-1,334 mg by mouth See admin instructions. Take 2 capsules by mouth three times a day with meals and take 1 capsule twice a day with snacks. 08/12/19   [provider]  Cholecalciferol (VITAMIN D) 2000 units tablet Take 4,000-6,000 Units by mouth daily.    [provider]  COMBIVENT RESPIMAT 20-100 MCG/ACT AERS respimat Inhale 1-2 puffs into the lungs every 6 (six) hours as needed for shortness of breath. 06/24/18   [provider]  Flaxseed, Linseed, (FLAX SEED OIL) 1000 MG CAPS Take 1,000 mg by mouth daily.    [provider]  folic acid (FOLVITE) 572 MCG tablet Take 400-800 mcg by mouth daily.    [provider]  Javier Docker Oil 350 MG CAPS Take 350 mg by mouth  daily.    [provider]  multivitamin (RENA-VIT) TABS tablet Take 1 tablet by mouth daily. 08/03/19   [provider]  oxyCODONE-acetaminophen (PERCOCET) 10-325 MG tablet Take 1 tablet by mouth 2 (two) times daily. 10/08/19   [provider]  REQUIP 0.25 MG tablet Take 0.25 mg by mouth at bedtime as needed. Restless legs 08/19/19   [provider]  temazepam (RESTORIL) 15 MG capsule Take 15 mg by mouth at bedtime as needed for sleep. 07/03/19   [provider]  vitamin B-12 (CYANOCOBALAMIN) 500 MCG tablet Take 1,000 mcg by mouth daily.    [provider]    Allergies    Patient has no known allergies.  Review of Systems   Review of Systems  Unable to perform ROS: Mental status change    Physical Exam Updated Vital Signs BP (!) 116/91   Pulse 88   Temp 98.2 F (36.8 C) (Oral)   Resp (!) 30   Ht $R'5\' 2"'rS$  (1.575 m)   Wt 49 kg   SpO2 98%   BMI 19.76 kg/m   Physical Exam Vitals and nursing note reviewed. Exam conducted with a chaperone present.  HENT:     Head: Normocephalic and atraumatic.  Eyes:     General: No scleral icterus.    Conjunctiva/sclera: Conjunctivae normal.  Cardiovascular:     Rate and Rhythm: Normal rate and regular rhythm.     Pulses: Normal pulses.     Heart sounds: Normal heart sounds.  Pulmonary:     Effort: Pulmonary effort is normal.     Breath sounds: Normal breath sounds.  Skin:    General: Skin is dry.     Capillary Refill: Capillary refill takes less than 2 seconds.  Neurological:     Comments: Patient is alert, knows she is at the hospital.  Possible expressive aphasia as unable to answer certain questions or repeat certain phrases.  Denies numbness throughout.  Smiles symmetrically.  Asterixis-like movements.  Psychiatric:        Mood and Affect: Mood normal.        Behavior: Behavior normal.      ED Results / Procedures / Treatments   Labs (all labs ordered are listed, but only  abnormal results are displayed) Labs Reviewed  CBC WITH DIFFERENTIAL/PLATELET - Abnormal; Notable for the following components:      Result Value   RBC 3.26 (*)    Hemoglobin 10.6 (*)    HCT 32.8 (*)    MCV 100.6 (*)    RDW 15.6 (*)  Monocytes Absolute 1.1 (*)    All other components within normal limits  COMPREHENSIVE METABOLIC PANEL - Abnormal; Notable for the following components:   Potassium 3.4 (*)    Chloride 93 (*)    BUN 53 (*)    Creatinine, Ser 11.02 (*)    Albumin 3.2 (*)    AST 63 (*)    GFR calc non Af Amer 3 (*)    GFR calc Af Amer 4 (*)    Anion gap 18 (*)    All other components within normal limits  CK - Abnormal; Notable for the following components:   Total CK 1,257 (*)    All other components within normal limits  TROPONIN I (HIGH SENSITIVITY) - Abnormal; Notable for the following components:   Troponin I (High Sensitivity) 33 (*)    All other components within normal limits  TROPONIN I (HIGH SENSITIVITY) - Abnormal; Notable for the following components:   Troponin I (High Sensitivity) 35 (*)    All other components within normal limits  SARS CORONAVIRUS 2 (TAT 6-24 HRS)  URINALYSIS, ROUTINE W REFLEX MICROSCOPIC  AMMONIA    EKG None  Radiology CT Head Wo Contrast  Result Date: 10/20/2019 CLINICAL DATA:  Speech difficulty EXAM: CT HEAD WITHOUT CONTRAST TECHNIQUE: Contiguous axial images were obtained from the base of the skull through the vertex without intravenous contrast. COMPARISON:  None. FINDINGS: Motion artifact is present inferiorly through the skull base and posterior fossa. Findings below are within this limitation. Brain: There is no acute intracranial hemorrhage, mass-effect, or edema. Gray-white differentiation is preserved. There is no extra-axial fluid collection. Ventricles and sulci are within normal limits in size and configuration. Vascular: There is atherosclerotic calcification at the skull base. Skull: Calvarium is unremarkable.  Sinuses/Orbits: No acute finding. Other: None. IMPRESSION: Partially motion degraded study. No acute intracranial hemorrhage, mass effect, or evidence of acute infarction. Electronically Signed   By: Macy Mis M.D.   On: 10/20/2019 15:49   DG Chest Portable 1 View  Result Date: 10/20/2019 CLINICAL DATA:  Altered mental status. EXAM: PORTABLE CHEST 1 VIEW COMPARISON:  Chest x-ray 10/28/2015. FINDINGS: Mediastinum hilar structures normal. Heart size normal. Mild bibasilar subsegmental atelectasis. No pleural effusion or pneumothorax. IMPRESSION: Mild bibasilar subsegmental atelectasis. Electronically Signed   By: Marcello Moores  Register   On: 10/20/2019 14:52    Procedures Procedures (including critical care time)  Medications Ordered in ED Medications - No data to display  ED Course  I have reviewed the triage vital signs and the nursing notes.  Pertinent labs & imaging results that were available during my care of the patient were reviewed by me and considered in my medical decision making (see chart for details).    MDM Rules/Calculators/A&P                      CT head without contrast obtained demonstrates no acute intracranial normalities.  CBC demonstrates a mild anemia.  CMP demonstrates very poor renal function, but no recent labs with which to compare.  She is on kidney transplant list.  She regularly receives dialysis and uncertain of most recent dialysis.  Troponin mildly bumped to 35, will continue to trend.  CK elevated to 1,257.  We will consult with neurology and then have patient admitted for altered mental status, possible stroke.  Dr. Roderic Palau personally evaluated patient and agrees with work-up and plan.    5:32 PM Spoke with Dr. Leonel Ramsay from neurology who personally evaluated patient and  recommended MRI brain and then admission for dialysis.  Her asterixis-like movements may be attributable to a metabolic encephalopathy.   5:32 PM Spoke with Family Medicine who will  evaluate and admit patient.    Final Clinical Impression(s) / ED Diagnoses Final diagnoses:  Confusion    Rx / DC Orders ED Discharge Orders    None       Corena Herter, PA-C 10/20/19 1732    Milton Ferguson, MD 10/21/19 [phone]

## 2019-10-20 NOTE — ED Notes (Signed)
Patient transported to CT 

## 2019-10-20 NOTE — ED Notes (Signed)
Report given to Merleen Nicely, RN on 3W

## 2019-10-20 NOTE — Progress Notes (Signed)
Patient arrived to 3w. Alert and oriented to self and place. States no pain. Generalized uncontrollable twitching. Skin intact. Tele has been placed. Bed in lowest position. Call light within reach. [place]

## 2019-10-20 NOTE — Discharge Summary (Signed)
[facility] Discharge Summary  Patient name: Bethany [name] Medical record number: [id number] Date of birth: 04-22-1946 Age: 73 y.o. 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 ESRD on HD, TTS schedule Hypertension Restless leg syndrome Underweight  Disposition: Home with home health PT, declined SNF  Discharge Condition: Stable, improved  Discharge Exam:   Physical Exam: General: thin pleasant older female, sitting comfortably in bed eating breakfast, conversant, answers all questions appropriately  CV: regular rate and rhythm without murmurs, rubs, or gallops, no lower extremity edema Lungs: clear to auscultation bilaterally with normal work of breathing Abdomen: soft, non-tender, non-distended, normoactive bowel sounds Skin: warm, dry Extremities: warm and well perfused MSK: strength 5/5 bilaterally UE and LE  Neuro: Alert and orientedx4, speech normal, no tremors or asterixis, follows all commands, answers all questions appropriately  Brief Hospital Course:  Bethany [name] is a 73 y.o. female who presented with altered mental status. PMH is significant for ESRD on HD TTS, HTN, HLD, RLS. Her hospital course is outlined below.  AMS Patient presented with AMS, last known normal 2 days prior to presentation.  On admission she was found to be aphasic with asterixis.  CT head was negative for acute bleed.  Patient's initial labs were consistent with her known ESRD, but without obvious need for emergent HD.  Neurology was consulted and recommended MRI for stroke evaluation and workup for metabolic encephalopathy.  MRI was negative for stroke.  Her mental status and  asterixis improved during her hospitalization after dialysis.  Is believe that these are both likely secondary to missed HD sessions.  She was seen by physical therapy and Occupational Therapy while inpatient who recommended SNF, but patient declined.  She demonstrated that she had capacity to make her own medical decisions.  She was ultimately sent home with home health.  She also refused DME rolling walker.  She was able to accept the risk of possibly falling at home without this prior to discharge.  At the time of discharge, patient was at her baseline, a and O x4, without asterixis, stable vital signs.  The patient's other chronic medical problems were addressed as needed during her hospitalization.  Issues for Follow Up:  1. Atenolol decreased to QD, consider discontinuing this long-term. 2. Ensure patient is compliant with HD. 3. Patient's Requip was held at discharge given possibility to worsen altered mental status.  Also advised patient to not use any further oxycodone or temazepam due to risk of altered mental status as well. 4. Patient cachectic on parents, consider nutrition follow-up as outpatient.  Significant Procedures: None  Significant Labs and Imaging:  Recent Labs  Lab 10/20/19 1435 10/21/19 0150  WBC 7.6 7.0  HGB 10.6* 10.2*  HCT 32.8* 30.5*  PLT 334 357   Recent Labs  Lab 10/20/19 1435 10/21/19 0150  NA 143 146*  K 3.4* 3.4*  CL 93* 98  CO2 32 29  GLUCOSE 90 76  BUN 53* 61*  CREATININE 11.02* 11.45*  CALCIUM 9.1 9.0  PHOS  --  5.5*  ALKPHOS 71  --   AST 63*  --   ALT 28  --   ALBUMIN 3.2* 3.1*    CT Head Wo Contrast  Result Date: 10/20/2019 CLINICAL DATA:  Speech difficulty EXAM:  CT HEAD WITHOUT CONTRAST TECHNIQUE: Contiguous axial images were obtained from the base of the skull through the vertex without intravenous contrast. COMPARISON:  None. FINDINGS: Motion artifact is present inferiorly through the skull base and posterior fossa. Findings below  are within this limitation. Brain: There is no acute intracranial hemorrhage, mass-effect, or edema. Gray-white differentiation is preserved. There is no extra-axial fluid collection. Ventricles and sulci are within normal limits in size and configuration. Vascular: There is atherosclerotic calcification at the skull base. Skull: Calvarium is unremarkable. Sinuses/Orbits: No acute finding. Other: None. IMPRESSION: Partially motion degraded study. No acute intracranial hemorrhage, mass effect, or evidence of acute infarction. Electronically Signed   By: Macy Mis M.D.   On: 10/20/2019 15:49   MR BRAIN WO CONTRAST  Result Date: 10/20/2019 CLINICAL DATA:  Encephalopathy EXAM: MRI HEAD WITHOUT CONTRAST TECHNIQUE: Multiplanar, multiecho pulse sequences of the brain and surrounding structures were obtained without intravenous contrast. COMPARISON:  None. FINDINGS: BRAIN: There is no acute infarct, acute hemorrhage or extra-axial collection. The white matter signal is normal for the patient's age. The cerebral and cerebellar volume are age-appropriate. There is no hydrocephalus. The midline structures are normal. VASCULAR: The major intracranial arterial and venous sinus flow voids are normal. Susceptibility-sensitive sequences show no chronic microhemorrhage or superficial siderosis. SKULL AND UPPER CERVICAL SPINE: Calvarial bone marrow signal is normal. There is no skull base mass. The visualized upper cervical spine and soft tissues are normal. SINUSES/ORBITS: There are no fluid levels or advanced mucosal thickening. The mastoid air cells and middle ear cavities are free of fluid. The orbits are normal. IMPRESSION: Normal MRI of the brain. Electronically Signed   By: Ulyses Jarred M.D.   On: 10/20/2019 20:11   DG Chest Portable 1 View  Result Date: 10/20/2019 CLINICAL DATA:  Altered mental status. EXAM: PORTABLE CHEST 1 VIEW COMPARISON:  Chest x-ray 10/28/2015. FINDINGS: Mediastinum hilar structures  normal. Heart size normal. Mild bibasilar subsegmental atelectasis. No pleural effusion or pneumothorax. IMPRESSION: Mild bibasilar subsegmental atelectasis. Electronically Signed   By: Marcello Moores  Register   On: 10/20/2019 14:52   Results/Tests Pending at Time of Discharge: None  Discharge Medications:  Allergies as of 10/22/2019   No Known Allergies     Medication List    STOP taking these medications   calcium acetate 667 MG capsule Commonly known as: PHOSLO   oxyCODONE-acetaminophen 10-325 MG tablet Commonly known as: PERCOCET   Requip 0.25 MG tablet Generic drug: rOPINIRole   temazepam 15 MG capsule Commonly known as: RESTORIL     TAKE these medications   atenolol 50 MG tablet Commonly known as: TENORMIN Take 1 tablet (50 mg total) by mouth daily. What changed: when to take this   beta carotene 25000 UNIT capsule Take 25,000-50,000 Units by mouth daily.   BIOTIN PO Take 1,000-1,500 mcg by mouth daily.   Combivent Respimat 20-100 MCG/ACT Aers respimat Generic drug: Ipratropium-Albuterol Inhale 1-2 puffs into the lungs every 6 (six) hours as needed for shortness of breath.   Flax Seed Oil 1000 MG Caps Take 1,000 mg by mouth daily.   folic acid 161 MCG tablet Commonly known as: FOLVITE Take 400-800 mcg by mouth daily.   Krill Oil 350 MG Caps Take 350 mg by mouth daily.   multivitamin Tabs tablet Take 1 tablet by mouth daily.   vitamin B-12 500 MCG tablet Commonly known as: CYANOCOBALAMIN Take 1,000 mcg by mouth daily.   Vitamin D 50 MCG (2000 UT) tablet Take 4,000-6,000 Units by  mouth daily.       Discharge Instructions: Please refer to Patient Instructions section of EMR for full details.  Patient was counseled important signs and symptoms that should prompt return to medical care, changes in medications, dietary instructions, activity restrictions, and follow up appointments.   Follow-Up Appointments: Follow-up Information    [facility], [facility] Follow up.   Specialty: Home Health Services Why: [facility] will reach out to schedule PT services.  Contact information: [address] [place] [address] [phone]           Cleophas Dunker, DO 10/22/2019, 1:34 PM PGY-2, [place]

## 2019-10-20 NOTE — H&P (Addendum)
[facility] Admission History and Physical Service Pager: [phone]  Patient name: Bethany [name] Medical record number: [id number] Date of birth: 1946/06/26 Age: 73 y.o. Gender: female  Primary Care Provider: Patient, No Pcp Per Consultants: Nephrology, Neurology Code Status: Full Preferred Emergency Contact: Barbara-Sister  Chief Complaint: AMS  Assessment and Plan: [name] is a 73 y.o. female presenting with altered mental status. PMH is significant for ESRD on HD TTS, HTN, HLD, RLS.   AMS [name] is a 73 year old female who presents today with altered mental status.  At baseline she appears to be a fully functioning and independent adult who drives herself to dialysis and has lunch with her friends.  He was found by her friends confused with expressive dysphasia at home having missed lunch today. Likely differentials are metabolic encephalopathy, stroke and overdose on narcotics/benzos, when reviewing the patient the exact etiology was unclear.  Patient goes to [facility] for HD, patient may have missed dialysis session but usually is very compliant.  Attempted to contact [place], but closed for the day. Creatinine 11 which is grossly increased from last creatinine in chart of 1.7 from 2015, but patient is ESRD, therefore 1.7 is certainly not her current baseline.   BUN elevated to 53, therefore uremia could contribute, but would expect a higher BUN to cause this.  Patient has asterixis, ammonia 12, WNL.  Pt has expressive asphasia with acute change in status considered stroke. However, CT head shows no acute intracranial hemorrhage or infarction.  MRI brain pending for most accurate diagnosis of stroke. Neurology has been consulted.  Considered purposeful/accidental overdose as per PDMP, patient did fill Rx for 7 day supply of oxy/acetaminophen (15 tabs) on 12/11 and has prior rx for temazepam, last filled on 9/5, 30 tabs for 30 day supply.  Will attempt to obtain UDS, although unsure if patient still makes urine.  Her initial troponins were mildly elevated to 35>33, EKG was without ST elevations or depressions, therefore doubt ACS.  CXR with mild bibasilar atelectasis, WBC 7.6, lungs are CTAB, and she is breathing comfortably on RA, therefore doubt pneumonia.  With normal temperature and WBC, would also doubt other source of infection.  Doubt seizure with post-ictal state given asterixis and aphasia with appropriate alertness are atypical for post-ictal state.  She lives alone, therefore will have PT/OT evaluate, as if she does not return to baseline, would not be safe for her to return home alone. -Admit to FPTS: Medical telemetry, Attending Dr. McDiarmid -N.p.o pending bedside swallow -Bedside swallow, formal speech eval - neurology following, appreciate recommendations -MRI brain -Folate, B12, given macrocytic anemia and med list - risk start labs for possible stroke: Hgb A1c and Lipid Panel -UA and UDS -Heparin 5000 units TID for VTE prophylaxis -Delirium and fall precautions -Call family about code status, attempted to call sister [name] on admission, but no answer -PT, OT  ESRD on HD, TTS Concern for missed HD session this week.  Dialysis at [facility] per chart review.  Called to clarify last HD given patient's AMS, but center closed at this time.  Patient now stating that she missed HD session, but also stated during another interview that she had not, therefore unreliable source.  Spoke with Dr. Carolin Sicks, nephrologist on call. Advised him of her current labs and vitals, notes no need for emergent HD today, but nephrology will see the patient tomorrow. -F/u nephrology recs -HD per nephrology -Monitor electrolytes and fluid status -I's and O's -Daily  weights  HTN BP 449-201E systolic Home meds: Atenolol $RemoveBefore'50mg'GixyIERoCiBIP$  BID -cont atenolol, considered holding for permissible HTN in event of possible stroke, but MRI  results now available with normal MRI, therefore will continue -Continue to monitor BP  HLD Diagnosis found on chart review, but no medications listed and no lipid panel found in chart.  Given stroke is on differential, will obtain Lipid Panel for risk stratification as well. - Lipid panel -Consider statin as needed  Restless Leg Syndrome Takes Requip 0.$RemoveBefore'25mg'QANFYOJvnRfpc$  at bedtime prn. -Hold Requip given AMS   Underweight Cachetic appearing BMI 19.76, weight 49 kg -Nutrition consult   FEN/GI: N.p.o. pending swallow eval Prophylaxis: Heparin SQ  Disposition: Inpatient for at least 2 to 3 days, admit to telemetry  History of Present Illness:  Bethany [name] is a 73 y.o. female presenting with altered mental status.  Review Of Systems:   History limited as pt confused in ED. Attempted to call sister but no answer. History per neurology note. Initially replied "doctor, doctor" after most questions asked. Pt was able to tell us that she had a fall but did not remember when over. Was able to tell us that she was supposed to meet her friends for lunch.  She denied missing HD.    Patient usually has dialysis on Tuesdays, Thursdays, Saturdays.  Patient usually is very functional and dependent, lives alone.  Per the notes notes patient is typically alert and orientated and her altered mental status today well off of her baseline.  Her friends and her usually need for lunch on Mondays and Wednesdays but today she did not show up for lunch and they went to evaluate to see if she was okay.  They found her on the floor at home.  She had last spoke normally 2 days ago.  Per the note her Sister [name] drives herself to and for dialysis.  It is unclear why the patient recently missed dialysis.  In the ED pertinent labs: CR 11, BUN 53, 3, K3.4, bicarb 32, anion gap 18, albumin 3.2, AST 63, CK 1257, troponin 35> 33, Hb 10.6, MCV 100.  Chest x-ray showed bilateral atelectasis.  CT head without contrast  showed Partially motion degraded study. No acute intracranial hemorrhage, mass effect, or evidence of acute infarction.  Of note, patient had a recent Rx from 12/11 for amoxicillin x6 days and oxy/acetaminophen x 7 days, unclear reason at this time.    Review of Systems  Unable to perform ROS: Mental status change      Patient Active Problem List   Diagnosis Date Noted  . Encephalopathy, metabolic 05/08/1974  . ESRD on dialysis ([place]) 08/03/2018  . Femoral hernia 06/30/2013    Past Medical History: Past Medical History:  Diagnosis Date  . Anemia   . Chronic kidney disease   . Heart murmur    as a child  . Hernia, femoral    left  . Hyperlipidemia    no meds aken  . Hypertension   . Pneumonia     Past Surgical History: Past Surgical History:  Procedure Laterality Date  . AV FISTULA PLACEMENT Left 08/14/2018   Procedure: INSERTION OF ARTERIOVENOUS GRAFT LEFT ARM;  Surgeon: Serafina Mitchell, MD;  Location: [facility] OR;  Service: Vascular;  Laterality: Left;  . COLONOSCOPY    . TONSILLECTOMY AND ADENOIDECTOMY     at age 37- unsure if adenoids were taken out    Social History: Social History   Tobacco Use  . Smoking status:  Current Every Day Smoker    Packs/day: 0.25    Types: Cigarettes  . Smokeless tobacco: Never Used  Substance Use Topics  . Alcohol use: Yes    Alcohol/week: 7.0 standard drinks    Types: 7 Glasses of wine per week    Comment: occasional  . Drug use: No    Comment: marijuana    Additional social history: Please also refer to relevant sections of EMR.  Family History: Family History  Problem Relation Age of Onset  . Heart attack Brother   . Breast cancer Other   . Skin cancer Other   . Colon cancer Neg Hx   . Pancreatic cancer Neg Hx   . Stomach cancer Neg Hx   . Esophageal cancer Neg Hx   . Rectal cancer Neg Hx     Allergies and Medications: No Known Allergies No current facility-administered medications on file prior to encounter.    Current Outpatient Medications on File Prior to Encounter  Medication Sig Dispense Refill  . amoxicillin (AMOXIL) 500 MG capsule Take 500 mg by mouth 2 (two) times daily.    Marland Kitchen atenolol (TENORMIN) 50 MG tablet Take 50 mg by mouth 2 (two) times daily.   5  . beta carotene 25000 UNIT capsule Take 25,000-50,000 Units by mouth daily.    Marland Kitchen BIOTIN PO Take 1,000-1,500 mcg by mouth daily.     . calcium acetate (PHOSLO) 667 MG capsule Take 667-1,334 mg by mouth See admin instructions. Take 2 capsules by mouth three times a day with meals and take 1 capsule twice a day with snacks.    . Cholecalciferol (VITAMIN D) 2000 units tablet Take 4,000-6,000 Units by mouth daily.    . COMBIVENT RESPIMAT 20-100 MCG/ACT AERS respimat Inhale 1-2 puffs into the lungs every 6 (six) hours as needed for shortness of breath.  11  . Flaxseed, Linseed, (FLAX SEED OIL) 1000 MG CAPS Take 1,000 mg by mouth daily.    . folic acid (FOLVITE) 676 MCG tablet Take 400-800 mcg by mouth daily.    Javier Docker Oil 350 MG CAPS Take 350 mg by mouth daily.    . multivitamin (RENA-VIT) TABS tablet Take 1 tablet by mouth daily.    Marland Kitchen oxyCODONE-acetaminophen (PERCOCET) 10-325 MG tablet Take 1 tablet by mouth 2 (two) times daily.    . REQUIP 0.25 MG tablet Take 0.25 mg by mouth at bedtime as needed. Restless legs    . temazepam (RESTORIL) 15 MG capsule Take 15 mg by mouth at bedtime as needed for sleep.    . vitamin B-12 (CYANOCOBALAMIN) 500 MCG tablet Take 1,000 mcg by mouth daily.      Objective: BP (!) 156/108 (BP Location: Left Leg)   Pulse (!) 102   Temp 98.2 F (36.8 C) (Oral)   Resp 18   Ht $R'5\' 2"'xg$  (1.575 m)   Wt 49 kg   SpO2 96%   BMI 19.76 kg/m  Exam:  General: Confused, fidgety, dishelved, cachetic appearing 73 yr old female, no acute distress Eyes: normal EOM ENTM: Dry mucous membranes Neck: Supple, no thyromegaly or lymphadenopathy Cardiovascular: S1 and S2 present. RRR Respiratory: Bilateral crackles to mid zones, no  wheeze or reduced air entry, normal work of breathing Gastrointestinal: Abdomen soft nontender, nondistended, bowel sounds present MSK: Moving all 4 limbs Derm: Skin warm and dry Neuro: A&Ox2, to self and place, follows commands, intermittent expressive aphasia and repetative/inappropriate answers, Pupils equal and reactive, about 59mm in size, significant asterixis of upper extremities  Psych: Flat affect, disordered thought process  Labs and Imaging: CBC BMET  Recent Labs  Lab 10/20/19 1435  WBC 7.6  HGB 10.6*  HCT 32.8*  PLT 334   Recent Labs  Lab 10/20/19 1435  NA 143  K 3.4*  CL 93*  CO2 32  BUN 53*  CREATININE 11.02*  GLUCOSE 90  CALCIUM 9.1     EKG: Sinus rhythm  CT Head Wo Contrast  Result Date: 10/20/2019 CLINICAL DATA:  Speech difficulty EXAM: CT HEAD WITHOUT CONTRAST TECHNIQUE: Contiguous axial images were obtained from the base of the skull through the vertex without intravenous contrast. COMPARISON:  None. FINDINGS: Motion artifact is present inferiorly through the skull base and posterior fossa. Findings below are within this limitation. Brain: There is no acute intracranial hemorrhage, mass-effect, or edema. Gray-white differentiation is preserved. There is no extra-axial fluid collection. Ventricles and sulci are within normal limits in size and configuration. Vascular: There is atherosclerotic calcification at the skull base. Skull: Calvarium is unremarkable. Sinuses/Orbits: No acute finding. Other: None. IMPRESSION: Partially motion degraded study. No acute intracranial hemorrhage, mass effect, or evidence of acute infarction. Electronically Signed   By: Macy Mis M.D.   On: 10/20/2019 15:49   MR BRAIN WO CONTRAST  Result Date: 10/20/2019 CLINICAL DATA:  Encephalopathy EXAM: MRI HEAD WITHOUT CONTRAST TECHNIQUE: Multiplanar, multiecho pulse sequences of the brain and surrounding structures were obtained without intravenous contrast. COMPARISON:  None.  FINDINGS: BRAIN: There is no acute infarct, acute hemorrhage or extra-axial collection. The white matter signal is normal for the patient's age. The cerebral and cerebellar volume are age-appropriate. There is no hydrocephalus. The midline structures are normal. VASCULAR: The major intracranial arterial and venous sinus flow voids are normal. Susceptibility-sensitive sequences show no chronic microhemorrhage or superficial siderosis. SKULL AND UPPER CERVICAL SPINE: Calvarial bone marrow signal is normal. There is no skull base mass. The visualized upper cervical spine and soft tissues are normal. SINUSES/ORBITS: There are no fluid levels or advanced mucosal thickening. The mastoid air cells and middle ear cavities are free of fluid. The orbits are normal. IMPRESSION: Normal MRI of the brain. Electronically Signed   By: Ulyses Jarred M.D.   On: 10/20/2019 20:11   DG Chest Portable 1 View  Result Date: 10/20/2019 CLINICAL DATA:  Altered mental status. EXAM: PORTABLE CHEST 1 VIEW COMPARISON:  Chest x-ray 10/28/2015. FINDINGS: Mediastinum hilar structures normal. Heart size normal. Mild bibasilar subsegmental atelectasis. No pleural effusion or pneumothorax. IMPRESSION: Mild bibasilar subsegmental atelectasis. Electronically Signed   By: [place]   On: 10/20/2019 14:52   Lattie Haw, MD 10/20/2019, 7:38 PM PGY-1, [place] Intern pager: [phone], text pages welcome  FPTS Upper-Level Resident Addendum   I have independently interviewed and examined the patient. I have discussed the above with the original author and agree with their documentation. My edits for correction/addition/clarification are in green. Please see also any attending notes.   [place] Constable, D.O. PGY-2, [facility] 10/20/2019 8:48 PM  [place] Service pager: [phone] (text pages welcome through [place])

## 2019-10-21 ENCOUNTER — Encounter (HOSPITAL_COMMUNITY): Payer: Self-pay | Admitting: Family Medicine

## 2019-10-21 DIAGNOSIS — Z992 Dependence on renal dialysis: Secondary | ICD-10-CM | POA: Diagnosis not present

## 2019-10-21 DIAGNOSIS — N186 End stage renal disease: Secondary | ICD-10-CM | POA: Diagnosis not present

## 2019-10-21 DIAGNOSIS — IMO0001 Reserved for inherently not codable concepts without codable children: Secondary | ICD-10-CM

## 2019-10-21 DIAGNOSIS — J449 Chronic obstructive pulmonary disease, unspecified: Secondary | ICD-10-CM | POA: Insufficient documentation

## 2019-10-21 DIAGNOSIS — G9341 Metabolic encephalopathy: Secondary | ICD-10-CM | POA: Diagnosis not present

## 2019-10-21 DIAGNOSIS — W19XXXA Unspecified fall, initial encounter: Secondary | ICD-10-CM

## 2019-10-21 DIAGNOSIS — G2581 Restless legs syndrome: Secondary | ICD-10-CM | POA: Insufficient documentation

## 2019-10-21 DIAGNOSIS — F17200 Nicotine dependence, unspecified, uncomplicated: Secondary | ICD-10-CM

## 2019-10-21 DIAGNOSIS — R410 Disorientation, unspecified: Secondary | ICD-10-CM

## 2019-10-21 DIAGNOSIS — G9349 Other encephalopathy: Secondary | ICD-10-CM | POA: Diagnosis not present

## 2019-10-21 HISTORY — DX: Reserved for inherently not codable concepts without codable children: IMO0001

## 2019-10-21 HISTORY — DX: Chronic obstructive pulmonary disease, unspecified: J44.9

## 2019-10-21 HISTORY — DX: Nicotine dependence, unspecified, uncomplicated: F17.200

## 2019-10-21 LAB — RENAL FUNCTION PANEL
Albumin: 3.1 g/dL — ABNORMAL LOW (ref 3.5–5.0)
Anion gap: 19 — ABNORMAL HIGH (ref 5–15)
BUN: 61 mg/dL — ABNORMAL HIGH (ref 8–23)
CO2: 29 mmol/L (ref 22–32)
Calcium: 9 mg/dL (ref 8.9–10.3)
Chloride: 98 mmol/L (ref 98–111)
Creatinine, Ser: 11.45 mg/dL — ABNORMAL HIGH (ref 0.44–1.00)
GFR calc Af Amer: 3 mL/min — ABNORMAL LOW (ref >60)
GFR calc non Af Amer: 3 mL/min — ABNORMAL LOW (ref >60)
Glucose, Bld: 76 mg/dL (ref 70–99)
Phosphorus: 5.5 mg/dL — ABNORMAL HIGH (ref 2.5–4.6)
Potassium: 3.4 mmol/L — ABNORMAL LOW (ref 3.5–5.1)
Sodium: 146 mmol/L — ABNORMAL HIGH (ref 135–145)

## 2019-10-21 LAB — HEMOGLOBIN A1C
Hgb A1c MFr Bld: 5.1 % (ref 4.8–5.6)
Mean Plasma Glucose: 99.67 mg/dL

## 2019-10-21 LAB — VITAMIN B12: Vitamin B-12: 2865 pg/mL — ABNORMAL HIGH (ref 180–914)

## 2019-10-21 LAB — LIPID PANEL
Cholesterol: 171 mg/dL (ref 0–200)
HDL: 53 mg/dL (ref >40)
LDL Cholesterol: 98 mg/dL (ref 0–99)
Total CHOL/HDL Ratio: 3.2 RATIO
Triglycerides: 100 mg/dL (ref <150)
VLDL: 20 mg/dL (ref 0–40)

## 2019-10-21 LAB — CBC
HCT: 30.5 % — ABNORMAL LOW (ref 36.0–46.0)
Hemoglobin: 10.2 g/dL — ABNORMAL LOW (ref 12.0–15.0)
MCH: 32.8 pg (ref 26.0–34.0)
MCHC: 33.4 g/dL (ref 30.0–36.0)
MCV: 98.1 fL (ref 80.0–100.0)
Platelets: 357 10*3/uL (ref 150–400)
RBC: 3.11 MIL/uL — ABNORMAL LOW (ref 3.87–5.11)
RDW: 15.7 % — ABNORMAL HIGH (ref 11.5–15.5)
WBC: 7 10*3/uL (ref 4.0–10.5)
nRBC: 0 % (ref 0.0–0.2)

## 2019-10-21 MED ORDER — CALCITRIOL 0.25 MCG PO CAPS: 0.5000 ug | ORAL_CAPSULE | Freq: Once | ORAL | Status: DC

## 2019-10-21 MED ORDER — PENTAFLUOROPROP-TETRAFLUOROETH EX AERO: 1.0000 "application " | INHALATION_SPRAY | CUTANEOUS | Status: DC | PRN

## 2019-10-21 MED ORDER — SODIUM CHLORIDE 0.9 % IV SOLN: 100.0000 mL | INTRAVENOUS | Status: DC | PRN

## 2019-10-21 MED ORDER — CALCITRIOL 0.25 MCG PO CAPS: 0.5000 ug | ORAL_CAPSULE | ORAL | Status: DC

## 2019-10-21 MED ORDER — CHLORHEXIDINE GLUCONATE CLOTH 2 % EX PADS
6.0000 | MEDICATED_PAD | Freq: Every day | CUTANEOUS | Status: DC
  Administered 2019-10-22: 6 via TOPICAL

## 2019-10-21 MED ORDER — RENA-VITE PO TABS
1.0000 | ORAL_TABLET | Freq: Every day | ORAL | Status: DC
  Administered 2019-10-21: 1 via ORAL
  Filled 2019-10-21: qty 1

## 2019-10-21 NOTE — Progress Notes (Signed)
PT Cancellation Note  Patient Details Name: Bethany [name] MRN: [id number] DOB: 1946/09/07   Cancelled Treatment:    Reason Eval/Treat Not Completed: Patient at procedure or test/unavailable  Patient working with SLP.   Arby Barrette, PT Pager [phone]  Rexanne Mano 10/21/2019, 12:46 PM

## 2019-10-21 NOTE — Evaluation (Signed)
Physical Therapy Evaluation Patient Details Name: Bethany [name] MRN: [id number] DOB: [birth year] Today's Date: 10/21/2019   History of Present Illness   73 y.o. female presenting 10/20/19 by EMS after friends found her on the floor of her home with altered mental status. Usually has dialysis TTS; last known HD was Thurs 12/17 . MRI brain normal. Mental status improving; +asterixis. PMH is significant for ESRD on HD TTS, HTN, HLD, RLS.  Clinical Impression   Pt admitted with above diagnosis. Patient normally lives alone, drives herself to dialysis, and meets friends for breakfast. Currently she is very high fall risk due to asterixis and bil knee buckling. Has not yet been to dialysis (and missed ?1 or 2 sessions PTA).  Pt currently with functional limitations due to the deficits listed below (see PT Problem List). Pt will benefit from skilled PT to increase their independence and safety with mobility to allow discharge to the venue listed below.       Follow Up Recommendations SNF;Supervision/Assistance - 24 hour (Will continue to assess needs after HD and if asterixis improves--anticipate she will refuse SNF, but lives alone)    Equipment Recommendations  Other (comment)(TBD if no SNF)    Recommendations for Other Services       Precautions / Restrictions Precautions Precautions: Fall Restrictions Weight Bearing Restrictions: No      Mobility  Bed Mobility Overal bed mobility: Needs Assistance Bed Mobility: Supine to Sit     Supine to sit: Supervision;HOB elevated        Transfers Overall transfer level: Needs assistance Equipment used: Rolling walker (2 wheeled) Transfers: Sit to/from Stand Sit to Stand: Min assist Stand pivot transfers: Min assist       General transfer comment: vc for hand placement for safe use of RW; x 2 with cues both times  Ambulation/Gait             General Gait Details: unsafe due to knees buckling (bil) Patient stood x max of 30  seconds with multiple "drops and catches" of both legs  Stairs            Wheelchair Mobility    Modified Rankin (Stroke Patients Only)       Balance Overall balance assessment: Needs assistance Sitting-balance support: No upper extremity supported;Feet supported Sitting balance-Leahy Scale: Good     Standing balance support: Bilateral upper extremity supported Standing balance-Leahy Scale: Poor Standing balance comment: jerking motions in standing                             Pertinent Vitals/Pain Pain Assessment: No/denies pain    Home Living Family/patient expects to be discharged to:: Unsure Living Arrangements: Alone Available Help at Discharge: Friend(s);Available PRN/intermittently Type of Home: Apartment       Home Layout: One level Home Equipment: Shower seat;Hand held shower head      Prior Function Level of Independence: Independent         Comments: Drives herself to dialysis     Hand Dominance   Dominant Hand: Right    Extremity/Trunk Assessment   Upper Extremity Assessment Upper Extremity Assessment: Defer to OT evaluation    Lower Extremity Assessment Lower Extremity Assessment: Generalized weakness(asterixis with buckling/catching)    Cervical / Trunk Assessment Cervical / Trunk Assessment: Normal  Communication   Communication: No difficulties  Cognition Arousal/Alertness: Awake/alert Behavior During Therapy: Impulsive Overall Cognitive Status: Impaired/Different from baseline Area of Impairment: Safety/judgement;Problem solving  Safety/Judgement: Decreased awareness of safety;Decreased awareness of deficits   Problem Solving: Requires verbal cues;Requires tactile cues General Comments: Thinks she is safe to go home; despite legs giving out      General Comments      Exercises     Assessment/Plan    PT Assessment Patient needs continued PT services  PT Problem List  Decreased strength;Decreased activity tolerance;Decreased balance;Decreased mobility;Decreased coordination;Decreased cognition;Decreased knowledge of use of DME;Decreased safety awareness;Impaired tone       PT Treatment Interventions DME instruction;Gait training;Stair training;Functional mobility training;Therapeutic activities;Therapeutic exercise;Balance training;Neuromuscular re-education;Cognitive remediation;Patient/family education    PT Goals (Current goals can be found in the Care Plan section)  Acute Rehab PT Goals Patient Stated Goal: to go home PT Goal Formulation: With patient Time For Goal Achievement: 11/04/19 Potential to Achieve Goals: Fair    Frequency Min 3X/week(?will refuse SNF)   Barriers to discharge Decreased caregiver support lives alone    Co-evaluation               AM-PAC PT "6 Clicks" Mobility  Outcome Measure Help needed turning from your back to your side while in a flat bed without using bedrails?: None Help needed moving from lying on your back to sitting on the side of a flat bed without using bedrails?: A Little Help needed moving to and from a bed to a chair (including a wheelchair)?: A Lot Help needed standing up from a chair using your arms (e.g., wheelchair or bedside chair)?: A Little Help needed to walk in hospital room?: Total Help needed climbing 3-5 steps with a railing? : Total 6 Click Score: 14    End of Session Equipment Utilized During Treatment: Gait belt Activity Tolerance: Treatment limited secondary to medical complications (Comment) Patient left: in chair;with call bell/phone within reach;with chair alarm set Nurse Communication: Mobility status PT Visit Diagnosis: Unsteadiness on feet (R26.81);Muscle weakness (generalized) (M62.81);History of falling (Z91.81);Other symptoms and signs involving the nervous system (R29.898)    Time: 3790-2409 PT Time Calculation (min) (ACUTE ONLY): 13 min   Charges:   PT  Evaluation $PT Eval Low Complexity: 1 Low           Arby Barrette, PT Pager [phone]   Rexanne Mano 10/21/2019, 3:41 PM

## 2019-10-21 NOTE — Progress Notes (Signed)
Initial Nutrition Assessment RD working remotely.  DOCUMENTATION CODES:   Not applicable  INTERVENTION:    Diet advancement as able pending swallow evaluation with SLP.   When diet is advanced, recommend adding Nepro Shake po TID, each supplement provides 425 kcal and 19 grams protein.  NUTRITION DIAGNOSIS:   Increased nutrient needs related to chronic illness(ESRD) as evidenced by estimated needs.  GOAL:   Patient will meet greater than or equal to 90% of their needs  MONITOR:   Diet advancement, PO intake, Labs, Weight trends, I & O's  REASON FOR ASSESSMENT:   Consult Assessment of nutrition requirement/status  ASSESSMENT:   73 yo female admitted with AMS. PMH includes ESRD on HD, HTN, HLD, RLS.   Mental status has improved today, but patient continues to have ataxia.   Remains NPO. Awaiting SLP evaluation.  Awaiting Nephrology evaluation as well. Last HD was one week ago.  Unable to complete NFPE, however, MD notes state that patient looks cachectic. Suspect she is malnourished. Unable to obtain enough information at this time for identification of malnutrition.   Labs reviewed. Sodium 146 (H), potassium 3.4 (L), BUN 61 (H), creatinine 11.45 (H), phosphorus 5.5 (H)  Medications reviewed.  NUTRITION - FOCUSED PHYSICAL EXAM:  unable to complete  Diet Order:   Diet Order            Diet NPO time specified  Diet effective now              EDUCATION NEEDS:   Not appropriate for education at this time  Skin:  Skin Assessment: Reviewed RN Assessment  Last BM:  12/23  Height:   Ht Readings from Last 1 Encounters:  10/20/19 $RemoveB'5\' 2"'huAPZTXO$  (1.575 m)    Weight:   Wt Readings from Last 1 Encounters:  10/20/19 49 kg    Ideal Body Weight:  50 kg  BMI:  Body mass index is 19.76 kg/m.  Estimated Nutritional Needs:   Kcal:  1600-1800  Protein:  70-80 gm  Fluid:  1 L + UOP    Molli Barrows, RD, LDN, [place] Pager [phone] After Hours Pager  [phone]

## 2019-10-21 NOTE — Progress Notes (Signed)
NEUROLOGY PROGRESS NOTE  Subjective: Patient is awake and alert.  Still having significant asterixis which she states is not normal for her.  She does know that she missed her last hemodialysis but states she did not miss the one prior.  She does not know her baseline creatinine and BUN.  Exam: Vitals:   10/21/19 0425 10/21/19 0830  BP: 127/84 108/84  Pulse: 76 85  Resp: 20 17  Temp: 98.5 F (36.9 C) 98.4 F (36.9 C)  SpO2: 100% 99%    ROS General ROS: negative for - chills, fatigue, fever, night sweats, weight gain or weight loss Psychological ROS: negative for - behavioral disorder, hallucinations, memory difficulties, mood swings or suicidal ideation Ophthalmic ROS: negative for - blurry vision, double vision, eye pain or loss of vision ENT ROS: negative for - epistaxis, nasal discharge, oral lesions, sore throat, tinnitus or vertigo Respiratory ROS: negative for - cough, hemoptysis, shortness of breath or wheezing Cardiovascular ROS: negative for - chest pain, dyspnea on exertion, edema or irregular heartbeat Gastrointestinal ROS: Positive for -  nausea/vomiting  Genito-Urinary ROS: negative for - dysuria, hematuria, incontinence or urinary frequency/urgency Musculoskeletal ROS: negative for - joint swelling or muscular weakness Neurological ROS: as noted in HPI Dermatological ROS: negative for rash and skin lesion changes        Physical Exam  Constitutional: Cachectic Psych: Affect appropriate to situation Eyes: No scleral injection HENT: No OP obstrucion Head: Normocephalic.   Respiratory: Effort normal, non-labored breathing GI: Soft.  No distension.   Skin: WDI   Neuro:  Mental Status: Alert, patient is oriented to [facility], the date, but believes it was 2014.  Thought content appropriate.  Speech fluent without evidence of aphasia.  Able to follow simple commands without difficulty. Cranial Nerves: II:  Visual fields grossly normal,  III,IV, VI: ptosis  not present, extra-ocular motions intact bilaterally pupils equal, round, reactive to light and accommodation V,VII: smile symmetric, facial light touch sensation normal bilaterally VIII: hearing normal bilaterally IX,X: Palate rises midline XI: bilateral shoulder shrug XII: midline tongue extension Motor: Right : Upper extremity   5/5    Left:     Upper extremity   5/5  Lower extremity   5/5     Lower extremity   5/5 Tone normal and bulk decreased Asterixis noted in bilateral upper extremities and also bilateral lower extremities, both positive no clonus at rest and  negative myoclonus when holding extremities antigravity Sensory: Pinprick and light touch intact throughout, bilaterally Deep Tendon Reflexes: 2+ and symmetric throughout upper extremities I cannot elicit any jerk and/or ankle jerk however patient was on the bedpan Plantars: Right: downgoing   Left: downgoing Cerebellar: Patient's finger-nose was dysmetric however this was secondary to the fact that she has significant asterixis     Medications:  Scheduled: . atenolol  50 mg Oral BID  . heparin  5,000 Units Subcutaneous Q8H   Continuous:  JKK:[id number] **OR** acetaminophen (TYLENOL) oral liquid 160 mg/5 mL **OR** acetaminophen, ipratropium-albuterol  Pertinent Labs/Diagnostics:   CT Head Wo Contrast  Result Date: 10/20/2019  IMPRESSION: Partially motion degraded study. No acute intracranial hemorrhage, mass effect, or evidence of acute infarction. Electronically Signed   By: Macy Mis M.D.   On: 10/20/2019 15:49   MR BRAIN WO CONTRAST  Result Date: 10/20/2019  IMPRESSION: Normal MRI of the brain. Electronically Signed   By: Ulyses Jarred M.D.   On: 10/20/2019 20:11   DG Chest Portable 1 View  Result Date: 10/20/2019  IMPRESSION: Mild bibasilar subsegmental atelectasis. Electronically Signed   By: [place]   On: 10/20/2019 14:52   Etta Quill PA-C [facility]uro[facility] [phone]  Assessment:  This is a 73 year old female who was last seen normal 2 days prior to admission.  When she was admitted to the ED she showed confusion, word perseveration, however was able to give some history and follow some commands.  The majority of patient's presentation was asterixis, confusion, metabolic encephalopathy.  Patient was on narcotics which can cause asterixis.  However most likely causes uremia.  MRI brain was normal.  Today on exam patient is much more alert and oriented and able to follow commands with coherent speech but still continues to have asterixis   Impression: -Encephalopathy now resolved -Asterixis most likely secondary to uremia  Recommendations: -As her mentation has improved greatly however asterixis has not, would recommend getting in contact with nephrology and hopefully get patient to dialysis as soon as possible. -Would continue to hold both narcotics and Requip  At this point neurology will sign off  10/21/2019, 8:58 AM

## 2019-10-21 NOTE — Progress Notes (Addendum)
Family Medicine Teaching Service Daily Progress Note Intern Pager: [phone]  Patient name: Bethany [name] Medical record number: [id number] Date of birth: Oct 22, 1946 Age: 73 y.o. Gender: female  Primary Care Provider: Patient, No Pcp Per Consultants: Neurology and Nephrology  Code Status: Full Code   Pt Overview and Major Events to Date:  10/20/19 - admitted for AMS 10/21/19 - improved mental status  Assessment and Plan: Bethany [name] is a 73 y.o. female presenting with altered mental status that has since improved however patient continues to have ataxia. PMH is significant for ESRD on HD TTS, HTN, HLD, RLS.  Altered mental status Patient with improved mental status today answer questions appropriately alert and oriented to person place, situation, year, president, city and state.  Was previously thought that mental status change due to uremia however he uremia level has increased this morning but mental status has improved. BUN has increased from 53-61 this morning. Vitamin B12 elevated 2865, hemoglobin A1c 5.1 and normal, lipid panel and TSH within normal limits.   ESRD HD Tuesday, Thursday, Saturday schedule Patient is reported to follow with [facility] kidney center.  Spoke with nurse this morning who states that the last time patient was known to have had dialysis was 1 week ago on Thursday.  Nephrology has been consulted and plans to see patient later today.  Creatinine remains elevated at 11.45 -HD per nephrology -We will appreciate nephrology recommendations as available  HTN BP 485-462V systolic Home meds: Atenolol $RemoveBefore'50mg'sOEoACvkftxTr$  BID -cont atenolol -Continue to monitor BP  Hx HLD per chart, normal lipid panel  Diagnosis found on chart review, but no medications listed and no lipid panel found in chart.  Lipid Panel for risk stratification as well and within normal limits. -will suggest monitoring by PCP   Restless Leg Syndrome Takes Requip 0.$RemoveBefore'25mg'WphColgVqvyLj$  at bedtime prn. -Hold Requip  given AMS   Underweight Cachetic appearing BMI 19.76, weight 49 kg -Nutrition consult   FEN/GI: Renal diet ending Passing of swallow study PPx: Subcutaneous heparin  Disposition: Pending medical improvement in regards to asterixis, PT/OT evaluation   Subjective:  Patient reports that she feels much better today, does not feel confused and denies any pain or difficulty breathing.  Objective: Temp:  [98.1 F (36.7 C)-99.3 F (37.4 C)] 98.4 F (36.9 C) (12/24 0830) Pulse Rate:  [62-102] 85 (12/24 0830) Resp:  [11-31] 17 (12/24 0830) BP: (108-198)/(53-108) 108/84 (12/24 0830) SpO2:  [94 %-100 %] 99 % (12/24 0830) Weight:  [49 kg] 49 kg (12/23 1214)  Physical Exam: General: Cachectic appearing female lying in bed in no acute distress, difficulty with coordination while moving on bed pain and when handed object Cardiovascular: Regular rate and rhythm without murmurs Respiratory: Clear to auscultation bilaterally without wheezing or crackles Abdomen: Concave, no masses palpated, no visible abdominal pulsation Extremities: frail, no edema  Neurology: Answers questions appropriately, oriented to person, place, year, president and situation, patient continues to have uncoordinated movements when trying to use sit in order to use the restroom, able to follow commands, continues to have asterixis  Laboratory: Recent Labs  Lab 10/20/19 1435 10/21/19 0150  WBC 7.6 7.0  HGB 10.6* 10.2*  HCT 32.8* 30.5*  PLT 334 357   Recent Labs  Lab 10/20/19 1435 10/21/19 0150  NA 143 146*  K 3.4* 3.4*  CL 93* 98  CO2 32 29  BUN 53* 61*  CREATININE 11.02* 11.45*  CALCIUM 9.1 9.0  PROT 6.5  --   BILITOT 0.8  --   [facility]  71  --   ALT 28  --   AST 63*  --   GLUCOSE 90 76    Imaging/Diagnostic Tests: CT Head Wo Contrast  Result Date: 10/20/2019 CLINICAL DATA:  Speech difficulty EXAM: CT HEAD WITHOUT CONTRAST TECHNIQUE: Contiguous axial images were obtained from the base of the skull  through the vertex without intravenous contrast. COMPARISON:  None. FINDINGS: Motion artifact is present inferiorly through the skull base and posterior fossa. Findings below are within this limitation. Brain: There is no acute intracranial hemorrhage, mass-effect, or edema. Gray-white differentiation is preserved. There is no extra-axial fluid collection. Ventricles and sulci are within normal limits in size and configuration. Vascular: There is atherosclerotic calcification at the skull base. Skull: Calvarium is unremarkable. Sinuses/Orbits: No acute finding. Other: None. IMPRESSION: Partially motion degraded study. No acute intracranial hemorrhage, mass effect, or evidence of acute infarction. Electronically Signed   By: Macy Mis M.D.   On: 10/20/2019 15:49   MR BRAIN WO CONTRAST  Result Date: 10/20/2019 CLINICAL DATA:  Encephalopathy EXAM: MRI HEAD WITHOUT CONTRAST TECHNIQUE: Multiplanar, multiecho pulse sequences of the brain and surrounding structures were obtained without intravenous contrast. COMPARISON:  None. FINDINGS: BRAIN: There is no acute infarct, acute hemorrhage or extra-axial collection. The white matter signal is normal for the patient's age. The cerebral and cerebellar volume are age-appropriate. There is no hydrocephalus. The midline structures are normal. VASCULAR: The major intracranial arterial and venous sinus flow voids are normal. Susceptibility-sensitive sequences show no chronic microhemorrhage or superficial siderosis. SKULL AND UPPER CERVICAL SPINE: Calvarial bone marrow signal is normal. There is no skull base mass. The visualized upper cervical spine and soft tissues are normal. SINUSES/ORBITS: There are no fluid levels or advanced mucosal thickening. The mastoid air cells and middle ear cavities are free of fluid. The orbits are normal. IMPRESSION: Normal MRI of the brain. Electronically Signed   By: Ulyses Jarred M.D.   On: 10/20/2019 20:11   DG Chest Portable 1  View  Result Date: 10/20/2019 CLINICAL DATA:  Altered mental status. EXAM: PORTABLE CHEST 1 VIEW COMPARISON:  Chest x-ray 10/28/2015. FINDINGS: Mediastinum hilar structures normal. Heart size normal. Mild bibasilar subsegmental atelectasis. No pleural effusion or pneumothorax. IMPRESSION: Mild bibasilar subsegmental atelectasis. Electronically Signed   By: Marcello Moores  Register   On: 10/20/2019 14:52     Stark Klein, MD 10/21/2019, 9:45 AM PGY-1, [facility] Intern pager: [phone], text pages welcome

## 2019-10-21 NOTE — Evaluation (Signed)
Clinical/Bedside Swallow Evaluation Patient Details  Name: Dillon Mcreynolds MRN: [id number] Date of Birth: 10-23-1946  Today's Date: 10/21/2019 Time: SLP Start Time (ACUTE ONLY): 1115 SLP Stop Time (ACUTE ONLY): 1133 SLP Time Calculation (min) (ACUTE ONLY): 18 min  Past Medical History:  Past Medical History:  Diagnosis Date  . Anemia   . Chronic kidney disease   . COPD (chronic obstructive pulmonary disease) ([place]) 10/21/2019  . ESRD on dialysis ([place]) 08/03/2018  . Fall 10/11/2019  . Femoral hernia 06/30/2013   LEFT    . Heart murmur    as a child  . Hernia, femoral    left  . Hyperlipidemia    no meds aken  . Hypertension   . Pneumonia   . Smoking 10/21/2019   Past Surgical History:  Past Surgical History:  Procedure Laterality Date  . AV FISTULA PLACEMENT Left 08/14/2018   Procedure: INSERTION OF ARTERIOVENOUS GRAFT LEFT ARM;  Surgeon: Serafina Mitchell, MD;  Location: [facility] OR;  Service: Vascular;  Laterality: Left;  . COLONOSCOPY    . TONSILLECTOMY AND ADENOIDECTOMY     at age 34- unsure if adenoids were taken out   HPI:  This is a 73 year old female who was last seen normal 2 days prior to admission.  When she was admitted to the ED she showed confusion, word perseveration, however was able to give some history and follow some commands.  The majority of patient's presentation was asterixis, confusion, metabolic encephalopathy.  Patient was on narcotics which can cause asterixis.  However most likely causes uremia.  MRI brain was normal.  Today on exam patient is much more alert and oriented and able to follow commands with coherent speech but still continues to have asterixis. BSE/SLE requested.   Assessment / Plan / Recommendation Clinical Impression  Clinical swallow evaluation completed at bedside. Oral motor examination is WNL with the exception of white coating on tongue and edentulous status. Pt reports that she eats whatever her friends make her (eggs) and does not eat a  lot of meat. Pt assessed with ice chips, thin water, puree, and graham crackers. Pt without overt signs of symptoms of aspiration and no significant residuals orally. Recommend D3/mech soft and thin liquids due to edentulous status. No further SLP services indicated at this time. SLP will sign off. See SLE.   SLP Visit Diagnosis: Dysphagia, unspecified (R13.10)    Aspiration Risk  No limitations    Diet Recommendation Dysphagia 3 (Mech soft);Thin liquid(due to edentulous status)   Liquid Administration via: Cup;Straw Medication Administration: Whole meds with liquid Supervision: Patient able to self feed Postural Changes: Seated upright at 90 degrees;Remain upright for at least 30 minutes after po intake    Other  Recommendations Oral Care Recommendations: Oral care BID Other Recommendations: Clarify dietary restrictions   Follow up Recommendations None      Frequency and Duration            Prognosis Prognosis for Safe Diet Advancement: Good      Swallow Study   General Date of Onset: 10/20/19 HPI: This is a 73 year old female who was last seen normal 2 days prior to admission.  When she was admitted to the ED she showed confusion, word perseveration, however was able to give some history and follow some commands.  The majority of patient's presentation was asterixis, confusion, metabolic encephalopathy.  Patient was on narcotics which can cause asterixis.  However most likely causes uremia.  MRI brain was normal.  Today on exam  patient is much more alert and oriented and able to follow commands with coherent speech but still continues to have asterixis. BSE/SLE requested. Type of Study: Bedside Swallow Evaluation Diet Prior to this Study: NPO Temperature Spikes Noted: No Respiratory Status: Room air History of Recent Intubation: No Behavior/Cognition: Alert;Cooperative;Pleasant mood Oral Cavity Assessment: (white coating on tongue) Oral Care Completed by SLP: Yes Oral Cavity  - Dentition: Edentulous Vision: Functional for self-feeding Self-Feeding Abilities: Able to feed self(?asterixis impacting right hand with self feedig) Patient Positioning: Upright in bed Baseline Vocal Quality: Normal Volitional Cough: Strong Volitional Swallow: Able to elicit    Oral/Motor/Sensory Function Overall Oral Motor/Sensory Function: Within functional limits   Ice Chips Ice chips: Within functional limits Presentation: Spoon   Thin Liquid Thin Liquid: Within functional limits Presentation: Cup;Self Fed;Straw    Nectar Thick Nectar Thick Liquid: Not tested   Honey Thick Honey Thick Liquid: Not tested   Puree Puree: Within functional limits Presentation: Self Fed;Spoon   Solid     Solid: Within functional limits Presentation: Self Fed(Pt edentulous and allowed crackers to soften)     Thank you,  Genene Churn, [place]  Tarance Balan 10/21/2019,11:58 AM

## 2019-10-21 NOTE — Evaluation (Signed)
Speech Language Pathology Evaluation Patient Details Name: Sameen Leas MRN: [id number] DOB: [birth year] Today's Date: 10/21/2019 Time: 8115-7262 SLP Time Calculation (min) (ACUTE ONLY): 16 min  Problem List:  Patient Active Problem List   Diagnosis Date Noted  . COPD (chronic obstructive pulmonary disease) ([place]) 10/21/2019  . Restless leg syndrome 10/21/2019  . Smoking 10/21/2019  . Altered mental status, unspecified 10/20/2019  . Fall 10/11/2019  . ESRD on dialysis [facility]) 08/03/2018   Past Medical History:  Past Medical History:  Diagnosis Date  . Anemia   . Chronic kidney disease   . COPD (chronic obstructive pulmonary disease) ([place]) 10/21/2019  . ESRD on dialysis ([place]) 08/03/2018  . Fall 10/11/2019  . Femoral hernia 06/30/2013   LEFT    . Heart murmur    as a child  . Hernia, femoral    left  . Hyperlipidemia    no meds aken  . Hypertension   . Pneumonia   . Smoking 10/21/2019   Past Surgical History:  Past Surgical History:  Procedure Laterality Date  . AV FISTULA PLACEMENT Left 08/14/2018   Procedure: INSERTION OF ARTERIOVENOUS GRAFT LEFT ARM;  Surgeon: Serafina Mitchell, MD;  Location: [facility] OR;  Service: Vascular;  Laterality: Left;  . COLONOSCOPY    . TONSILLECTOMY AND ADENOIDECTOMY     at age 85- unsure if adenoids were taken out   HPI:  This is a 73 year old female who was last seen normal 2 days prior to admission.  When she was admitted to the ED she showed confusion, word perseveration, however was able to give some history and follow some commands.  The majority of patient's presentation was asterixis, confusion, metabolic encephalopathy.  Patient was on narcotics which can cause asterixis.  However most likely causes uremia.  MRI brain was normal.  Today on exam patient is much more alert and oriented and able to follow commands with coherent speech but still continues to have asterixis. BSE/SLE requested.   Assessment / Plan / Recommendation Clinical  Impression  Pt appears at baseline cognitive linguistic functioning, however no friends or family present to confirm. Pt without aphasia, dysarthria, or dysphagia. She followed commands without repetition and verbally expressed complex thoughts. Pt feels that the only change is the jerking like movements in her right hand when self feeding. No further SLP services indicated at this time. SLP will sign off.     SLP Assessment  SLP Recommendation/Assessment: Patient does not need any further Speech Lanaguage Pathology Services SLP Visit Diagnosis: Cognitive communication deficit (R41.841)    Follow Up Recommendations  None    Frequency and Duration           SLP Evaluation Cognition  Overall Cognitive Status: Within Functional Limits for tasks assessed Arousal/Alertness: Awake/alert Orientation Level: Oriented to person;Oriented to place;Oriented to situation(self corrected year) Memory: Appears intact Awareness: Appears intact Problem Solving: Appears intact Safety/Judgment: Appears intact       Comprehension  Auditory Comprehension Overall Auditory Comprehension: Appears within functional limits for tasks assessed Yes/No Questions: Within Functional Limits Commands: Within Functional Limits(for 2-step) Conversation: Complex Visual Recognition/Discrimination Discrimination: Within Function Limits Reading Comprehension Reading Status: Not tested    Expression Expression Primary Mode of Expression: Verbal Verbal Expression Overall Verbal Expression: Appears within functional limits for tasks assessed Initiation: No impairment Automatic Speech: Name;Social Response;Month of year Level of Generative/Spontaneous Verbalization: Conversation Repetition: No impairment Naming: No impairment Pragmatics: No impairment Non-Verbal Means of Communication: Not applicable Written Expression Dominant Hand: Right Written Expression: Not  tested   Oral / Motor  Oral Motor/Sensory  Function Overall Oral Motor/Sensory Function: Within functional limits Motor Speech Overall Motor Speech: Appears within functional limits for tasks assessed Respiration: Within functional limits Phonation: Normal Resonance: Within functional limits Articulation: Within functional limitis Intelligibility: Intelligible Motor Planning: Witnin functional limits Motor Speech Errors: Not applicable Interfering Components: Inadequate dentition   Thank you,  Genene Churn, [place] 10/21/2019, 12:08 PM

## 2019-10-21 NOTE — Evaluation (Signed)
Occupational Therapy Evaluation Patient Details Name: Bethany [name] MRN: [id number] DOB: Mar 20, 1946 Today's Date: 10/21/2019    History of Present Illness  73 y.o. female presenting 10/20/19 by EMS after friends found her on the floor of her home with altered mental status. Usually has dialysis TTS; last known HD was Thurs 12/17 . MRI brain normal. Mental status improving; +asterixis. PMH is significant for ESRD on HD TTS, HTN, HLD, RLS.   Clinical Impression   This 73 yo female admitted with above presents to acute OT with decreased insight into deficits, decreased balance, decreased mobility, jerking of arms and legs when trying to engage in tasks all affecting her safety and independence with basic ADLs. She will benefit from acute OT with follow up at SNF.    Follow Up Recommendations  SNF;Supervision/Assistance - 24 hour    Equipment Recommendations  Other (comment)(TBD next venue)       Precautions / Restrictions Precautions Precautions: Fall Restrictions Weight Bearing Restrictions: No      Mobility Bed Mobility Overal bed mobility: Needs Assistance Bed Mobility: Supine to Sit     Supine to sit: Supervision;HOB elevated        Transfers Overall transfer level: Needs assistance Equipment used: Rolling walker (2 wheeled) Transfers: Sit to/from [facility] Sit to Stand: Min assist Stand pivot transfers: Min assist       General transfer comment: but then once in standing she would have jerking causing her knees to buckle.Tried transfer with RW but with jerking decided to do a quick stand pivot to recliner.    Balance Overall balance assessment: Needs assistance Sitting-balance support: No upper extremity supported;Feet supported Sitting balance-Leahy Scale: Good     Standing balance support: Bilateral upper extremity supported Standing balance-Leahy Scale: Poor Standing balance comment: jerking motions in standing                           ADL either performed or assessed with clinical judgement   ADL Overall ADL's : Needs assistance/impaired Eating/Feeding: Set up;Sitting   Grooming: Set up;Supervision/safety;Sitting   Upper Body Bathing: Set up;Supervision/ safety;Sitting   Lower Body Bathing: Minimal assistance;Sit to/from stand Lower Body Bathing Details (indicate cue type and reason): but with jerking when she stands up she can quickly be total A for balance in standing Upper Body Dressing : Set up;Sitting   Lower Body Dressing: Minimal assistance;Sit to/from stand Lower Body Dressing Details (indicate cue type and reason): but with jerking when she stands up she can quickly be total A for balance in standing Toilet Transfer: Minimal assistance;Stand-pivot Toilet Transfer Details (indicate cue type and reason): bed>recliner (quickly), if did this slowly with RW pt would have jerking in legs causing decreased balance Toileting- Clothing Manipulation and Hygiene: Minimal assistance;Sit to/from stand Toileting - Clothing Manipulation Details (indicate cue type and reason): but with jerking when she stands up she can quickly be total A for balance in standing       General ADL Comments: increased time for all basic ADLs due to jerking of arms and in legs with standing     Vision Baseline Vision/History: Wears glasses Wears Glasses: Reading only Patient Visual Report: No change from baseline Additional Comments: pt unable to follow for visual tracking            Pertinent Vitals/Pain Pain Assessment: No/denies pain     Hand Dominance Right   Extremity/Trunk Assessment Upper Extremity Assessment Upper Extremity Assessment: Generalized weakness(Has jerks  of both hands (seen when donning socks and trying to pick up a drink)--chart states asterixis)           Communication Communication Communication: No difficulties   Cognition Arousal/Alertness: Awake/alert Behavior During Therapy:  Impulsive Overall Cognitive Status: Impaired/Different from baseline Area of Impairment: Safety/judgement;Problem solving                         Safety/Judgement: Decreased awareness of safety;Decreased awareness of deficits   Problem Solving: Requires verbal cues;Requires tactile cues General Comments: When asked why she was here she said, "I fell and hit my head"              Home Living Family/patient expects to be discharged to:: Skilled nursing facility Living Arrangements: Alone Available Help at Discharge: Friend(s);Available PRN/intermittently Type of Home: Apartment       Home Layout: One level     Bathroom Shower/Tub: Tub/shower unit;Curtain   Biochemist, clinical: Standard     Home Equipment: Shower seat;Hand held shower head      Lives With: Alone    Prior Functioning/Environment Level of Independence: Independent        Comments: Drives herself to dialysis        OT Problem List: Impaired balance (sitting and/or standing);Impaired UE functional use;Decreased coordination;Decreased safety awareness;Decreased cognition      OT Treatment/Interventions: Self-care/ADL training;DME and/or AE instruction;Patient/family education;Balance training    OT Goals(Current goals can be found in the care plan section) Acute Rehab OT Goals Patient Stated Goal: to go home OT Goal Formulation: With patient Time For Goal Achievement: 11/04/19 Potential to Achieve Goals: Good  OT Frequency: Min 2X/week   Barriers to D/C: Decreased caregiver support             AM-PAC OT "6 Clicks" Daily Activity     Outcome Measure Help from another person eating meals?: A Little Help from another person taking care of personal grooming?: A Little Help from another person toileting, which includes using toliet, bedpan, or urinal?: A Lot Help from another person bathing (including washing, rinsing, drying)?: A Little Help from another person to put on and taking off  regular upper body clothing?: A Little Help from another person to put on and taking off regular lower body clothing?: A Little 6 Click Score: 17   End of Session Equipment Utilized During Treatment: Gait belt;Rolling walker Nurse Communication: Mobility status;Need for lift equipment(for safety, NT and RN aware)  Activity Tolerance: Patient tolerated treatment well Patient left: in chair;with call bell/phone within reach;with chair alarm set  OT Visit Diagnosis: Unsteadiness on feet (R26.81);Other abnormalities of gait and mobility (R26.89);Other symptoms and signs involving cognitive function                Time: 1202-1235 OT Time Calculation (min): 33 min Charges:  OT General Charges $OT Visit: 1 Visit OT Evaluation $OT Eval Moderate Complexity: 1 Mod OT Treatments $Self Care/Home Management : 8-22 mins  Golden Circle, OTR/L Acute [employer] Pager [phone] Office [phone]     Almon Register 10/21/2019, 1:33 PM

## 2019-10-21 NOTE — Consult Note (Signed)
Reason for Consult: Continuity of ESRD care Referring Physician: Sherren Mocha McDiarmid MD (FPTS)  HPI:  73 year old African-American woman with past medical history significant for hypertension, chronic obstructive lung disease, dyslipidemia, restless leg syndrome and end-stage renal disease on hemodialysis on a Tuesday/Thursday/Saturday schedule.  She was brought into the emergency room yesterday after her friends found her to be confused with word finding difficulties.  She underwent evaluation for acute CVA with an MRI of the brain that was reported as normal.  Overnight, her mental status appears to have improved however she continues to have uncontrollable limb jerking movements.  On my conversation with her, she appears to still be somewhat confused and unsure of the details leading up to her hospitalization.  She distinctly denies any chest pain or shortness of breath and does not have any fevers or chills.  She denies any nausea, vomiting or diarrhea prior to admission.  She last went to hemodialysis on 10/16/2019 and completed 3 hours and 54 minutes of scheduled for our dialysis.  Prior to that she signed off at 3 hours and 20 minutes on 12/17 and 1 hour 55 minutes on 12/15.  Dialysis prescription: TTS, [facility], 4 hours, EDW 44 kg, Optiflux 180, BFR 400/DFR 800, 2K/2.0 calcium, no heparin.  Left upper arm AVG.  Mircera 30 mcg IV every 2 weeks, calcitriol 0.5 mcg 3 times weekly.  Past Medical History:  Diagnosis Date  . Anemia   . Chronic kidney disease   . COPD (chronic obstructive pulmonary disease) ([place]) 10/21/2019  . ESRD on dialysis ([place]) 08/03/2018  . Fall 10/11/2019  . Femoral hernia 06/30/2013   LEFT    . Heart murmur    as a child  . Hernia, femoral    left  . Hyperlipidemia    no meds aken  . Hypertension   . Pneumonia   . Smoking 10/21/2019    Past Surgical History:  Procedure Laterality Date  . AV FISTULA PLACEMENT Left 08/14/2018   Procedure: INSERTION OF  ARTERIOVENOUS GRAFT LEFT ARM;  Surgeon: Serafina Mitchell, MD;  Location: [facility] OR;  Service: Vascular;  Laterality: Left;  . COLONOSCOPY    . TONSILLECTOMY AND ADENOIDECTOMY     at age 73- unsure if adenoids were taken out    Family History  Problem Relation Age of Onset  . Heart attack Brother   . Breast cancer Other   . Skin cancer Other   . Colon cancer Neg Hx   . Pancreatic cancer Neg Hx   . Stomach cancer Neg Hx   . Esophageal cancer Neg Hx   . Rectal cancer Neg Hx     Social History:  reports that she has been smoking cigarettes. She has been smoking about 0.25 packs per day. She has never used smokeless tobacco. She reports current alcohol use of about 7.0 standard drinks of alcohol per week. She reports that she does not use drugs.  Allergies: No Known Allergies  Medications:  Scheduled: . atenolol  50 mg Oral BID  . [START ON 10/22/2019] Chlorhexidine Gluconate Cloth  6 each Topical Q0600  . heparin  5,000 Units Subcutaneous Q8H    BMP Latest Ref Rng & Units 10/21/2019 10/20/2019 08/14/2018  Glucose 70 - 99 mg/dL 76 90 83  BUN 8 - 23 mg/dL 61(H) 53(H) -  Creatinine 0.44 - 1.00 mg/dL 11.45(H) 11.02(H) -  Sodium 135 - 145 mmol/L 146(H) 143 139  Potassium 3.5 - 5.1 mmol/L 3.4(L) 3.4(L) 3.2(L)  Chloride 98 -  111 mmol/L 98 93(L) -  CO2 22 - 32 mmol/L 29 32 -  Calcium 8.9 - 10.3 mg/dL 9.0 9.1 -   CBC Latest Ref Rng & Units 10/21/2019 10/20/2019 08/14/2018  WBC 4.0 - 10.5 K/uL 7.0 7.6 -  Hemoglobin 12.0 - 15.0 g/dL 10.2(L) 10.6(L) 11.6(L)  Hematocrit 36.0 - 46.0 % 30.5(L) 32.8(L) 34.0(L)  Platelets 150 - 400 K/uL 357 334 -     CT Head Wo Contrast  Result Date: 10/20/2019 CLINICAL DATA:  Speech difficulty EXAM: CT HEAD WITHOUT CONTRAST TECHNIQUE: Contiguous axial images were obtained from the base of the skull through the vertex without intravenous contrast. COMPARISON:  None. FINDINGS: Motion artifact is present inferiorly through the skull base and posterior fossa.  Findings below are within this limitation. Brain: There is no acute intracranial hemorrhage, mass-effect, or edema. Gray-white differentiation is preserved. There is no extra-axial fluid collection. Ventricles and sulci are within normal limits in size and configuration. Vascular: There is atherosclerotic calcification at the skull base. Skull: Calvarium is unremarkable. Sinuses/Orbits: No acute finding. Other: None. IMPRESSION: Partially motion degraded study. No acute intracranial hemorrhage, mass effect, or evidence of acute infarction. Electronically Signed   By: Macy Mis M.D.   On: 10/20/2019 15:49   MR BRAIN WO CONTRAST  Result Date: 10/20/2019 CLINICAL DATA:  Encephalopathy EXAM: MRI HEAD WITHOUT CONTRAST TECHNIQUE: Multiplanar, multiecho pulse sequences of the brain and surrounding structures were obtained without intravenous contrast. COMPARISON:  None. FINDINGS: BRAIN: There is no acute infarct, acute hemorrhage or extra-axial collection. The white matter signal is normal for the patient's age. The cerebral and cerebellar volume are age-appropriate. There is no hydrocephalus. The midline structures are normal. VASCULAR: The major intracranial arterial and venous sinus flow voids are normal. Susceptibility-sensitive sequences show no chronic microhemorrhage or superficial siderosis. SKULL AND UPPER CERVICAL SPINE: Calvarial bone marrow signal is normal. There is no skull base mass. The visualized upper cervical spine and soft tissues are normal. SINUSES/ORBITS: There are no fluid levels or advanced mucosal thickening. The mastoid air cells and middle ear cavities are free of fluid. The orbits are normal. IMPRESSION: Normal MRI of the brain. Electronically Signed   By: Ulyses Jarred M.D.   On: 10/20/2019 20:11   DG Chest Portable 1 View  Result Date: 10/20/2019 CLINICAL DATA:  Altered mental status. EXAM: PORTABLE CHEST 1 VIEW COMPARISON:  Chest x-ray 10/28/2015. FINDINGS: Mediastinum hilar  structures normal. Heart size normal. Mild bibasilar subsegmental atelectasis. No pleural effusion or pneumothorax. IMPRESSION: Mild bibasilar subsegmental atelectasis. Electronically Signed   By: Marcello Moores  Register   On: 10/20/2019 14:52    Review of Systems  Constitutional: Negative for chills, fatigue and fever.  HENT: Negative for congestion, hearing loss, sore throat and voice change.   Eyes: Negative.   Respiratory: Negative for cough, chest tightness, shortness of breath and wheezing.   Cardiovascular: Negative.   Gastrointestinal: Negative.   Endocrine: Negative.   Genitourinary: Negative for flank pain, frequency, hematuria and vaginal bleeding.  Musculoskeletal: Negative for arthralgias, joint swelling and neck stiffness.  Skin: Negative.   Neurological: Positive for tremors, speech difficulty and weakness.   Blood pressure 128/69, pulse 77, temperature 98.2 F (36.8 C), temperature source Oral, resp. rate 16, height $RemoveBe'5\' 2"'jFUbtxatG$  (1.575 m), weight 49 kg, SpO2 98 %. Physical Exam  Nursing note and vitals reviewed. Constitutional: She appears well-developed and well-nourished.  HENT:  Head: Normocephalic and atraumatic.  Mouth/Throat: Oropharynx is clear and moist.  Eyes: Pupils are equal, round, and  reactive to light. Conjunctivae and EOM are normal.  Neck: No JVD present.  Cardiovascular: Normal rate, regular rhythm and normal heart sounds.  No murmur heard. Respiratory: Effort normal and breath sounds normal. She has no wheezes. She has no rales.  GI: Soft. Bowel sounds are normal. There is no abdominal tenderness. There is no rebound and no guarding.  Musculoskeletal:        General: No edema.     Cervical back: Normal range of motion and neck supple.     Comments: Left upper arm arteriovenous graft with palpable thrill  Neurological: She is alert.  Asterixis of upper limbs noted  Skin: Skin is warm and dry. No rash noted.    Assessment/Plan: 1.  Altered mental status/level  of consciousness: Evaluation by neurology reviewed from their notes-indeed possible that this might be a manifestation of uremic encephalopathy with under dialysis from her missing/truncating dialysis treatments.  Will order for hemodialysis today and review response with regards to mental status/asterixis.  I reviewed her home medications with her and she confirms that she has not recently started any additional prescriptions. 2.  End-stage renal disease: Will order for hemodialysis today per her outpatient TTS schedule and continue to reevaluate her closely for additional dialysis needs.  She may be able to discharge home tomorrow if mental status/asterixis are resolved to resume dialysis as an outpatient. 3.  Anemia of chronic disease: She gets Mircera as an outpatient and denies any overt blood loss, will continue to follow to decide on additional management. 4.  Secondary hyperparathyroidism: I have reordered her calcitriol for PTH suppression that she will get with dialysis today.  Monitor on renal diet/phosphorus binders. 5.  Hypertension: Intermittently elevated blood pressures noted, reeducated regarding adherence with antihypertensive therapy and hemodialysis.  She appears euvolemic on exam. 6.  Nutrition: Resume renal diet, renal multivitamin and protein supplementation.  Altamese Deguire K. 10/21/2019, 4:48 PM

## 2019-10-22 DIAGNOSIS — R410 Disorientation, unspecified: Secondary | ICD-10-CM | POA: Diagnosis not present

## 2019-10-22 DIAGNOSIS — G9349 Other encephalopathy: Principal | ICD-10-CM

## 2019-10-22 DIAGNOSIS — Z992 Dependence on renal dialysis: Secondary | ICD-10-CM | POA: Diagnosis not present

## 2019-10-22 DIAGNOSIS — N19 Unspecified kidney failure: Secondary | ICD-10-CM

## 2019-10-22 DIAGNOSIS — N186 End stage renal disease: Secondary | ICD-10-CM | POA: Diagnosis not present

## 2019-10-22 MED ORDER — ATENOLOL 50 MG PO TABS: 50.0000 mg | ORAL_TABLET | Freq: Every day | ORAL | 0 refills | Status: DC

## 2019-10-22 NOTE — Discharge Instructions (Signed)
You were admitted to the hospital because you were confused, likely caused by your missed dialysis sessions.  Is very important that you remember that you should go to all dialysis sessions.  Home health physical therapy will be seeing you.  Please make appointment with your regular doctor in the next week.  If you have worsening of symptoms, feel confused, feel weak, have difficulty breathing, or worsening symptoms in any way, you should be seen immediately.  You should only take your atenolol once a day.   You should stop taking oxycodone and temazepam if you still have them at home.

## 2019-10-22 NOTE — Progress Notes (Signed)
FPTS Interim Progress Note  Discussed with patient at bedside that patient is at increased risk for falls and she has been recommended to use a rolling walker at home.  She reports that she does not want to have a rolling walker.  She is able to prove that she is A&O x4, has capacity to make her own medical decisions.  Explained the risks to the patient of not going home with a rolling walker, including risk of fall, hitting head, ultimately death.  Patient was able to verbalize this back, still declines walker.  Patient will be discharged home today, home health PT ordered, no rolling walker ordered.   [place], DO 10/22/2019, 12:04 PM PGY-2, [facility] pager [phone]

## 2019-10-22 NOTE — Progress Notes (Signed)
Physical Therapy Treatment Patient Details Name: Bethany [name] MRN: [id number] DOB: August 29, 1946 Today's Date: 10/22/2019    History of Present Illness  73 y.o. female presenting 10/20/19 by EMS after friends found her on the floor of her home with altered mental status. Usually has dialysis TTS; last known HD was Thurs 12/17 . MRI brain normal. Mental status improving; +asterixis. PMH is significant for ESRD on HD TTS, HTN, HLD, RLS.   PT Comments    Pt progressing well with mobility. Able to transfer and ambulate without device, min guard for balance due to instability; pt declining use of RW and not interested in using one at home. Recommend follow-up with HHPT services to maximize functional mobility and independence at home; pt unsure if she wants this.   Follow Up Recommendations  Home health PT;Supervision for mobility/OOB(likely to decline)     Equipment Recommendations  Rolling walker with 5" wheels(pt declined)    Recommendations for Other Services       Precautions / Restrictions Precautions Precautions: Fall Restrictions Weight Bearing Restrictions: No    Mobility  Bed Mobility Overal bed mobility: Independent                Transfers Overall transfer level: Needs assistance Equipment used: None Transfers: Sit to/from Stand           General transfer comment: Initial LOB upon standing requiring sit on bed, stood again with min guard  Ambulation/Gait Ambulation/Gait assistance: Min guard           General Gait Details: Amb into bathroom without UE support and mild unsteadiness, reaching to furniture for added stability; declines use of RW.   Stairs             Wheelchair Mobility    Modified Rankin (Stroke Patients Only)       Balance Overall balance assessment: Needs assistance   Sitting balance-Leahy Scale: Good       Standing balance-Leahy Scale: Fair                              Cognition  Arousal/Alertness: Awake/alert Behavior During Therapy: WFL for tasks assessed/performed;Impulsive Overall Cognitive Status: No family/caregiver present to determine baseline cognitive functioning Area of Impairment: Safety/judgement                         Safety/Judgement: Decreased awareness of safety;Decreased awareness of deficits            Exercises      General Comments General comments (skin integrity, edema, etc.): Session limited, pt wanting to drink coffee while using bathroom in hopes of having BM; instructed to use call string and wait for assist of NT when done      Pertinent Vitals/Pain Pain Assessment: No/denies pain    Home Living                      Prior Function            PT Goals (current goals can now be found in the care plan section) Progress towards PT goals: Progressing toward goals    Frequency    Min 3X/week      PT Plan Discharge plan needs to be updated    Co-evaluation              AM-PAC PT "6 Clicks" Mobility   Outcome Measure  Help needed turning from  your back to your side while in a flat bed without using bedrails?: None Help needed moving from lying on your back to sitting on the side of a flat bed without using bedrails?: None Help needed moving to and from a bed to a chair (including a wheelchair)?: A Little Help needed standing up from a chair using your arms (e.g., wheelchair or bedside chair)?: A Little Help needed to walk in hospital room?: A Little Help needed climbing 3-5 steps with a railing? : A Little 6 Click Score: 20    End of Session   Activity Tolerance: Patient tolerated treatment well Patient left: (using bathroom) Nurse Communication: Mobility status PT Visit Diagnosis: Unsteadiness on feet (R26.81);Muscle weakness (generalized) (M62.81);History of falling (Z91.81);Other symptoms and signs involving the nervous system (R29.898)     Time: 5277-8242 PT Time Calculation  (min) (ACUTE ONLY): 17 min  Charges:  $Therapeutic Activity: 8-22 mins                    Mabeline Caras, PT, DPT Acute Rehabilitation Services  Pager [phone] Office [place] 10/22/2019, 11:17 AM

## 2019-10-22 NOTE — Progress Notes (Signed)
Family Medicine Teaching Service Daily Progress Note Intern Pager: [phone]  Patient name: Bethany [name] Medical record number: [id number] Date of birth: [birth year] Age: 73 y.o. Gender: female  Primary Care Provider: Patient, No Pcp Per Consultants: Neurology and Nephrology  Code Status: Full Code   Pt Overview and Major Events to Date:  10/20/19 - admitted for AMS 10/21/19 - improved mental status  Assessment and Plan: Bethany [name] is a 73 y.o. female presenting with altered mental status that has since improved however patient continues to have ataxia. PMH is significant for ESRD on HD TTS, HTN, HLD, RLS.  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 No asterixis this AM. Neuro exam WNL. Patient notes she feels much better and back to her baseline. 1L off at HD yesterday. Per neurology, suspect AMS secondary to uremic encephalopathy.  SLP evaluated and appears to be at baseline cognitive linguistic functioning - signed off. PT/OT evaluated, recommend SNF. Patient declined SNF. Will have PT/OT see again this AM and provide recommendations for home DME needs. Patient has many friends that can offer help when needed. She lives alone at home.  - follow up PT recs - likely home with home home health PT/OT, orders placed  ESRD HD (TTS) Followed at [facility] TTS. EDW 44kg. Wt pending today.  -HD per nephrology - Plan to return to normal schedule (which will be Sunday given holiday schedule)  HTN Normotensive overnight.  Home meds: Atenolol $RemoveBefore'50mg'MbzLxsyPxkPBp$  BID -cont atenolol -Continue to monitor BP - Discharge patient on Atenolol QD - Recommend discontinuation of Atenolol as outpatient given lack of mortality benefit in elderly  Restless Leg Syndrome Home Meds: Requip 0.$RemoveBefore'25mg'VMXjkDtxbhtGs$  qHS prn - continue to hold home Requip at this time, consider restarting at discharge  Underweight Cachetic appearing BMI 19.76,  weight 49 kg - Nutrition consulted, appreciate recs  FEN/GI: Dysphasia 3 diet  PPx: SQ heparin   Disposition: Pending medical improvement in regards to asterixis, PT/OT evaluation   Subjective:  Patient notes she is feeling back to normal today. She is ready to go home. Declines SNF. Notes she has good friends such as Deirdre Evener that can come by to help her when she needs it. Denies any hallucination, tremors. Eating, voiding, and stooling normally.  Objective: Temp:  [98.1 F (36.7 C)-98.8 F (37.1 C)] 98.4 F (36.9 C) (12/25 0832) Pulse Rate:  [53-77] 65 (12/25 0832) Resp:  [16-20] 20 (12/25 0832) BP: (97-128)/(45-70) 115/59 (12/25 0832) SpO2:  [82 %-98 %] 97 % (12/25 0832) Weight:  [48.2 kg-49.1 kg] 48.2 kg (12/24 2230)  Physical Exam: General: thin pleasant older female, sitting comfortably in bed eating breakfast, conversant, answers all questions appropriately  CV: regular rate and rhythm without murmurs, rubs, or gallops, no lower extremity edema Lungs: clear to auscultation bilaterally with normal work of breathing Abdomen: soft, non-tender, non-distended, normoactive bowel sounds Skin: warm, dry Extremities: warm and well perfused MSK: strength 5/5 bilaterally UE and LE  Neuro: Alert and orientedx4, speech normal, no tremors or asterixis, follows all commands, answers all questions appropriately  Laboratory: Recent Labs  Lab 10/20/19 1435 10/21/19 0150  WBC 7.6 7.0  HGB 10.6* 10.2*  HCT 32.8* 30.5*  PLT 334 357   Recent Labs  Lab 10/20/19 1435 10/21/19 0150  NA 143 146*  K 3.4* 3.4*  CL 93* 98  CO2 32 29  BUN 53* 61*  CREATININE 11.02* 11.45*  CALCIUM 9.1 9.0  PROT 6.5  --  BILITOT 0.8  --   ALKPHOS 71  --   ALT 28  --   AST 63*  --   GLUCOSE 90 76    Imaging/Diagnostic Tests: No results found.   Mina Marble Waverly, DO 10/22/2019, 11:13 AM PGY-2, [place] Intern pager: [phone], text pages welcome

## 2019-10-22 NOTE — TOC Initial Note (Addendum)
Transition of Care [facility]) - Initial/Assessment Note    Patient Details  Name: Inaya Gillham MRN: [id number] Date of Birth: Nov 01, 1945  Transition of Care [facility]) CM/SW Contact:    Alberteen Sam, [place] Phone Number: [phone] 10/22/2019, 11:31 AM  Clinical Narrative:                  Update: [facility] able to accept patient for [place] met with patient regarding discharge planning and PT recommendation of home health and DME rolling walker. Patient is declining rolling walker at this time but is agreeable to home health PT. She has no preference and agreed for CSW to send referrals to see what Home Health agency can accept her and insurance.   CSW has reached out to [place] with [facility], pending response. CSW has also reached out to [facility] with [place], pending response.   Will need Home Health PT orders at time of discharge. MD made aware.   Expected Discharge Plan: [place] Barriers to Discharge: No Barriers Identified   Patient Goals and CMS Choice Patient states their goals for this hospitalization and ongoing recovery are:: to go home CMS Medicare.gov Compare Post Acute Care list provided to:: Patient Choice offered to / list presented to : Patient  Expected Discharge Plan and Services Expected Discharge Plan: [place] Choice: [place] arrangements for the past 2 months: Single Family Home                           HH Arranged: PT          Prior Living Arrangements/Services Living arrangements for the past 2 months: Single Family Home Lives with:: Self, Other (Comment)(has friend/neighbor support) Patient language and need for interpreter reviewed:: Yes Do you feel safe going back to the place where you live?: Yes      Need for Family Participation in Patient Care: No (Comment) Care giver support system in place?: Yes (comment)   Criminal Activity/Legal  Involvement Pertinent to Current Situation/Hospitalization: No - Comment as needed  Activities of Daily Living      Permission Sought/Granted Permission sought to share information with : Case Manager, Customer service manager Permission granted to share information with : Yes, Verbal Permission Granted     Permission granted to share info w AGENCY: Home Health        Emotional Assessment Appearance:: Appears stated age Attitude/Demeanor/Rapport: Gracious Affect (typically observed): Calm Orientation: : Oriented to Self, Oriented to Place, Oriented to  Time, Oriented to Situation Alcohol / Substance Use: Not Applicable Psych Involvement: No (comment)  Admission diagnosis:  Confusion [R41.0] Encephalopathy, metabolic [L27.51] Patient Active Problem List   Diagnosis Date Noted  . COPD (chronic obstructive pulmonary disease) ([place]) 10/21/2019  . Restless leg syndrome 10/21/2019  . Smoking 10/21/2019  . Confusion   . Altered mental status, unspecified 10/20/2019  . Fall 10/11/2019  . ESRD on dialysis [facility]) 08/03/2018   PCP:  Patient, No Pcp Per Pharmacy:   [facility], [place] - [address] [place] [address] Phone: [phone] Fax: [phone]  [facility] [place], [place] - [address] [place], [address] [address] Phone: [phone] Fax: [phone]     Social Determinants of Health (SDOH) Interventions    Readmission  Risk Interventions No flowsheet data found.

## 2019-10-22 NOTE — Progress Notes (Signed)
Patient ID: Bethany [name], female   DOB: [birth year], 73 y.o.   MRN: [id number] [facility] Progress Note   Assessment/ Plan:   1.  Altered mental status/level of consciousness:  No acute intracranial process noted on MRI done earlier this hospitalization with suspicion raised regarding uremic encephalopathy.. 2.  End-stage renal disease:  She is usually on a Tuesday/Thursday/Saturday dialysis schedule and underwent hemodialysis yesterday without problems.  She may be discharged home from a renal standpoint to resume her outpatient dialysis on Sunday (per holiday scheduling). 3.  Anemia of chronic disease: She gets Mircera as an outpatient and denies any overt blood loss, will continue to follow to decide on additional management. 4.  Secondary hyperparathyroidism: Continue renal diet and ongoing calcitriol for PTH suppression. 5.  Hypertension:  Patient euvolemic on physical exam with blood pressures under acceptable control. 6.  Nutrition: Resume renal diet, renal multivitamin and protein supplementation. Subjective:   Underwent hemodialysis yesterday without problems.  No jerking movements of limbs this morning and exclaims mental status is back to baseline.   Objective:   BP 99/66 (BP Location: Right Arm)   Pulse (!) 56   Temp 98.8 F (37.1 C) (Oral)   Resp 18   Ht $R'5\' 2"'Db$  (1.575 m)   Wt 48.2 kg   SpO2 95%   BMI 19.44 kg/m   Physical Exam: Gen: Comfortably resting in bed, CVS: Pulse regular rhythm, normal rate, S1 and S2 normal Resp: Clear to auscultation, no rales/rhonchi Abd: Soft, flat, nontender Ext: No lower extremity edema, left upper arm AVG with intact dressings.  Labs: BMET Recent Labs  Lab 10/20/19 1435 10/21/19 0150  NA 143 146*  K 3.4* 3.4*  CL 93* 98  CO2 32 29  GLUCOSE 90 76  BUN 53* 61*  CREATININE 11.02* 11.45*  CALCIUM 9.1 9.0  PHOS  --  5.5*   CBC Recent Labs  Lab 10/20/19 1435 10/21/19 0150  WBC 7.6 7.0  NEUTROABS 4.9  --    HGB 10.6* 10.2*  HCT 32.8* 30.5*  MCV 100.6* 98.1  PLT 334 357     Medications:    . atenolol  50 mg Oral BID  . calcitRIOL  0.5 mcg Oral Once in dialysis   Followed by  . [START ON 10/24/2019] calcitRIOL  0.5 mcg Oral Once in dialysis   Followed by  . [START ON 10/26/2019] calcitRIOL  0.5 mcg Oral Q T,Th,Sa-HD  . Chlorhexidine Gluconate Cloth  6 each Topical Q0600  . heparin  5,000 Units Subcutaneous Q8H  . multivitamin  1 tablet Oral QHS   Bethany Shiley, MD 10/22/2019, 8:30 AM

## 2019-10-23 ENCOUNTER — Telehealth: Payer: Self-pay | Admitting: Nephrology

## 2019-10-23 NOTE — Telephone Encounter (Signed)
Transition of care contact from inpatient facility  Date of discharge: 10/22/19 Date of contact: 10/23/19  Spoke to patient on the phone  Patient contacted to discuss transition of care from recent inpatient hospitalization. Patient was admitted to [facility] from 12/23 to 10/22/19 secondary to uremic encephalopathy. Patient says she's doing much better than she was a few days ago.  Medication changes reviewed with patient.  Patient will follow up with outpatient dialysis center on tomorrow 12/27 d/t holiday schedule. Patient made aware. She thought she missed dialysis today.  No other follow up needs reported.

## 2019-10-24 ENCOUNTER — Other Ambulatory Visit: Payer: Self-pay

## 2019-10-24 ENCOUNTER — Emergency Department (HOSPITAL_COMMUNITY): Payer: Medicare HMO

## 2019-10-24 ENCOUNTER — Emergency Department (HOSPITAL_COMMUNITY)
Admission: EM | Admit: 2019-10-24 | Discharge: 2019-10-24 | Disposition: A | Discharge status: Home / Self Care | Payer: Medicare HMO | Attending: Emergency Medicine | Admitting: Emergency Medicine

## 2019-10-24 ENCOUNTER — Encounter (HOSPITAL_COMMUNITY): Payer: Self-pay | Admitting: Emergency Medicine

## 2019-10-24 DIAGNOSIS — F17210 Nicotine dependence, cigarettes, uncomplicated: Secondary | ICD-10-CM | POA: Insufficient documentation

## 2019-10-24 DIAGNOSIS — Z992 Dependence on renal dialysis: Secondary | ICD-10-CM | POA: Diagnosis not present

## 2019-10-24 DIAGNOSIS — J449 Chronic obstructive pulmonary disease, unspecified: Secondary | ICD-10-CM | POA: Diagnosis not present

## 2019-10-24 DIAGNOSIS — N186 End stage renal disease: Secondary | ICD-10-CM | POA: Insufficient documentation

## 2019-10-24 DIAGNOSIS — E876 Hypokalemia: Secondary | ICD-10-CM | POA: Diagnosis not present

## 2019-10-24 DIAGNOSIS — Z79899 Other long term (current) drug therapy: Secondary | ICD-10-CM | POA: Insufficient documentation

## 2019-10-24 DIAGNOSIS — W19XXXD Unspecified fall, subsequent encounter: Secondary | ICD-10-CM | POA: Diagnosis not present

## 2019-10-24 DIAGNOSIS — I120 Hypertensive chronic kidney disease with stage 5 chronic kidney disease or end stage renal disease: Secondary | ICD-10-CM | POA: Insufficient documentation

## 2019-10-24 DIAGNOSIS — R471 Dysarthria and anarthria: Secondary | ICD-10-CM | POA: Insufficient documentation

## 2019-10-24 DIAGNOSIS — R519 Headache, unspecified: Secondary | ICD-10-CM | POA: Diagnosis present

## 2019-10-24 LAB — COMPREHENSIVE METABOLIC PANEL
ALT: 39 U/L (ref 0–44)
AST: 86 U/L — ABNORMAL HIGH (ref 15–41)
Albumin: 2.9 g/dL — ABNORMAL LOW (ref 3.5–5.0)
Alkaline Phosphatase: 63 U/L (ref 38–126)
Anion gap: 14 (ref 5–15)
BUN: 12 mg/dL (ref 8–23)
CO2: 32 mmol/L (ref 22–32)
Calcium: 8.2 mg/dL — ABNORMAL LOW (ref 8.9–10.3)
Chloride: 94 mmol/L — ABNORMAL LOW (ref 98–111)
Creatinine, Ser: 2.97 mg/dL — ABNORMAL HIGH (ref 0.44–1.00)
GFR calc Af Amer: 18 mL/min — ABNORMAL LOW (ref >60)
GFR calc non Af Amer: 15 mL/min — ABNORMAL LOW (ref >60)
Glucose, Bld: 92 mg/dL (ref 70–99)
Potassium: 2.8 mmol/L — ABNORMAL LOW (ref 3.5–5.1)
Sodium: 140 mmol/L (ref 135–145)
Total Bilirubin: 0.6 mg/dL (ref 0.3–1.2)
Total Protein: 6.2 g/dL — ABNORMAL LOW (ref 6.5–8.1)

## 2019-10-24 LAB — DIFFERENTIAL
Abs Immature Granulocytes: 0.05 10*3/uL (ref 0.00–0.07)
Basophils Absolute: 0 10*3/uL (ref 0.0–0.1)
Basophils Relative: 0 %
Eosinophils Absolute: 0.1 10*3/uL (ref 0.0–0.5)
Eosinophils Relative: 1 %
Immature Granulocytes: 1 %
Lymphocytes Relative: 16 %
Lymphs Abs: 1.7 10*3/uL (ref 0.7–4.0)
Monocytes Absolute: 1.2 10*3/uL — ABNORMAL HIGH (ref 0.1–1.0)
Monocytes Relative: 11 %
Neutro Abs: 7.8 10*3/uL — ABNORMAL HIGH (ref 1.7–7.7)
Neutrophils Relative %: 71 %

## 2019-10-24 LAB — CBC
HCT: 30.2 % — ABNORMAL LOW (ref 36.0–46.0)
Hemoglobin: 9.2 g/dL — ABNORMAL LOW (ref 12.0–15.0)
MCH: 32.3 pg (ref 26.0–34.0)
MCHC: 30.5 g/dL (ref 30.0–36.0)
MCV: 106 fL — ABNORMAL HIGH (ref 80.0–100.0)
Platelets: 206 10*3/uL (ref 150–400)
RBC: 2.85 MIL/uL — ABNORMAL LOW (ref 3.87–5.11)
RDW: 15.2 % (ref 11.5–15.5)
WBC: 10.9 10*3/uL — ABNORMAL HIGH (ref 4.0–10.5)
nRBC: 0.2 % (ref 0.0–0.2)

## 2019-10-24 LAB — PROTIME-INR
INR: 1 (ref 0.8–1.2)
Prothrombin Time: 13.5 seconds (ref 11.4–15.2)

## 2019-10-24 LAB — FOLATE RBC
Folate, Hemolysate: >620 ng/mL
Folate, RBC: >2123 ng/mL (ref >498)
Hematocrit: 29.2 % — ABNORMAL LOW (ref 34.0–46.6)

## 2019-10-24 LAB — APTT: aPTT: 39 seconds — ABNORMAL HIGH (ref 24–36)

## 2019-10-24 LAB — ETHANOL: Alcohol, Ethyl (B): <10 mg/dL (ref <10)

## 2019-10-24 IMAGING — MR MR HEAD W/O CM
9 of 10 series · 40 of 48 positions shown · non-contrast
Comparison: CT head without contrast [DATE]. MR head without
contrast [DATE]

CLINICAL DATA: New onset left frontal headache. Patient recently
hospitalized [DATE] after a fall with confusion and slurred speech.

EXAM:
MRI HEAD WITHOUT CONTRAST
TECHNIQUE: Multiplanar, multiecho pulse sequences of the brain and surrounding
structures were obtained without intravenous contrast.

[Series 3: T1 · sagittal · 5.0mm · 0.47mm/px · 2 of 19 slices shown (1 of 2)]
[im 1/19]
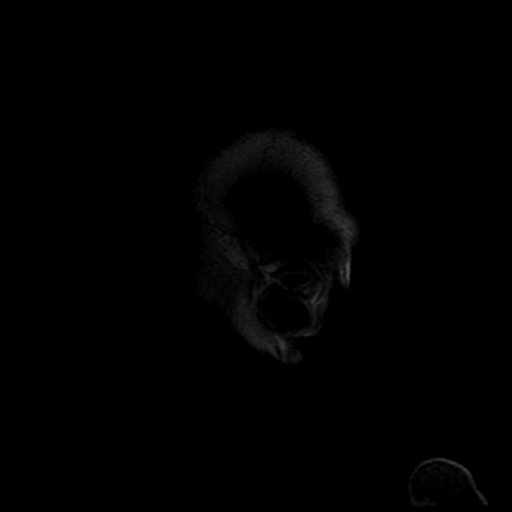
[im 19/19]
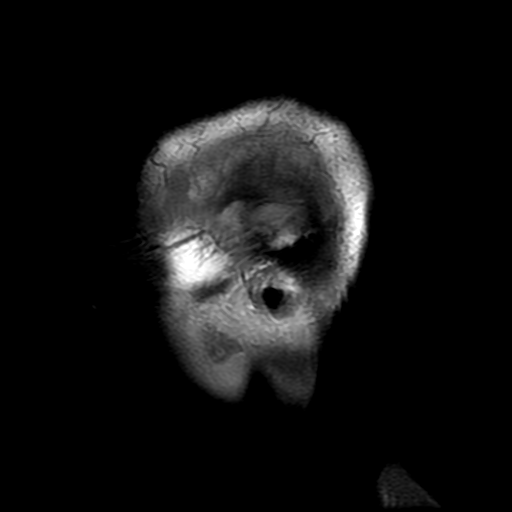

[Series 4: DWI · axial · 3.0mm · 1.09mm/px · z∈[-47,+76]mm · 9 of 86 slices shown (1 of 4)]
[im 1/86]
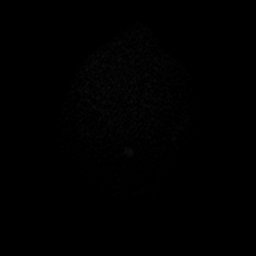
[im 11/86]
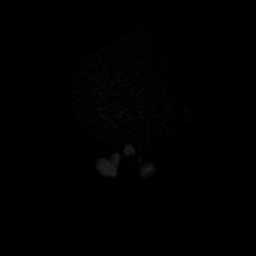
[im 22/86]
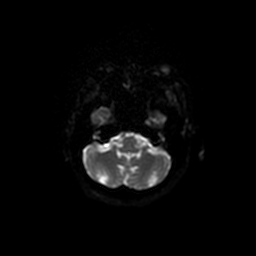
[im 32/86]
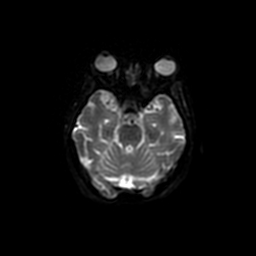
[im 43/86]
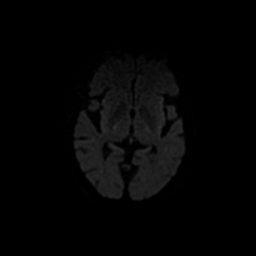
[im 54/86]
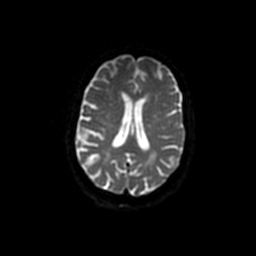
[im 64/86]
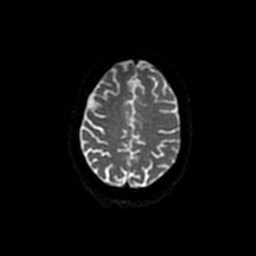
[im 75/86]
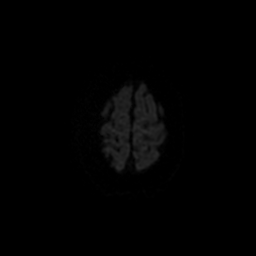
[im 86/86]
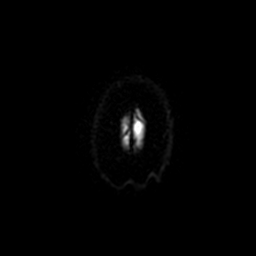

[Series 5: T2 · axial · 5.0mm · 0.86mm/px · z∈[-40,+83]mm · 2 of 19 slices shown (1 of 2)]
[im 1/19]
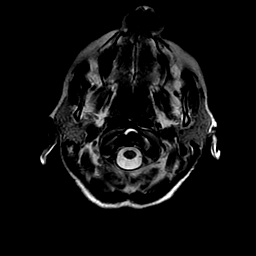
[im 19/19]
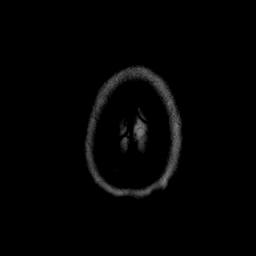

[Series 6: FLAIR · axial · 3.0mm · 0.86mm/px · z∈[-42,+86]mm · 2 of 23 slices shown]
[im 1/23]
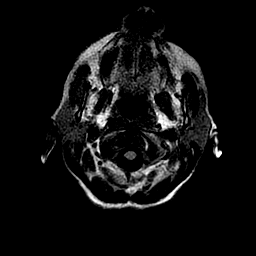
[im 23/23]
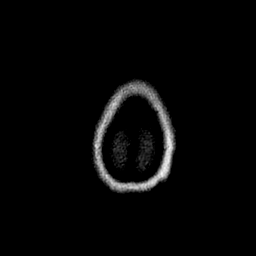

[Series 8: DWI · coronal · 4.0mm · 1.09mm/px · 8 of 78 slices shown (2 of 4)]
[im 1/78]
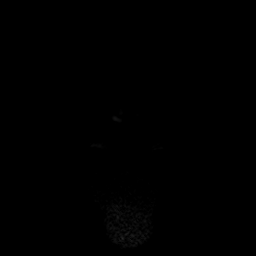
[im 12/78]
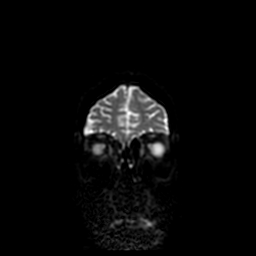
[im 23/78]
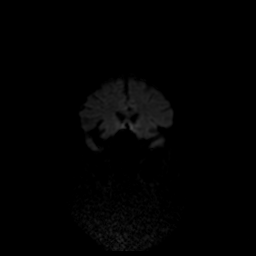
[im 34/78]
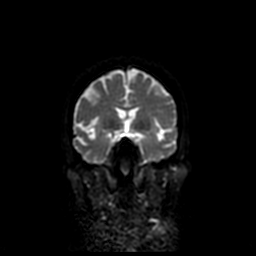
[im 45/78]
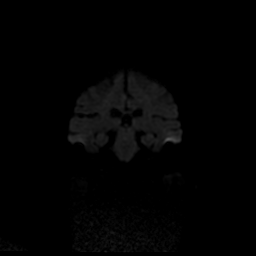
[im 56/78]
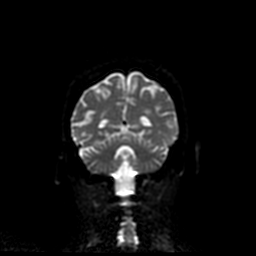
[im 67/78]
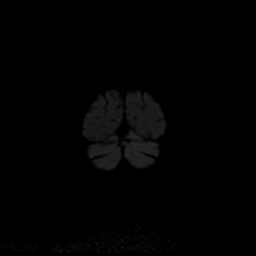
[im 78/78]
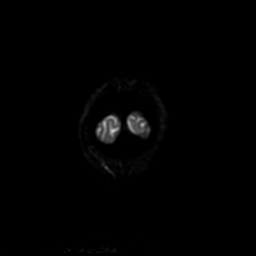

[Series 9: T1 · axial · 1.0mm · 0.47mm/px · z∈[-38,+59]mm · 7 of 124 slices shown (2 of 2)]
[im 1/124]
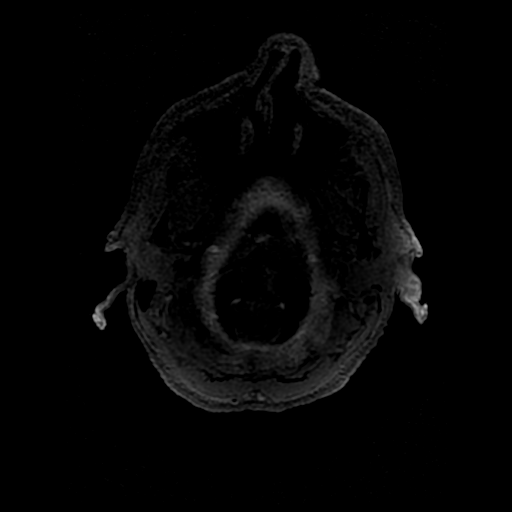
[im 23/124]
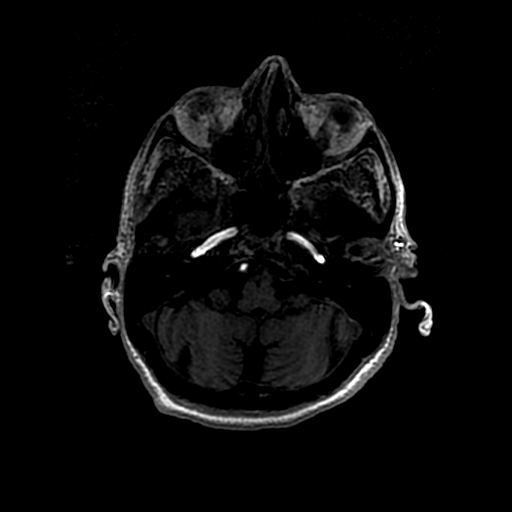
[im 34/124]
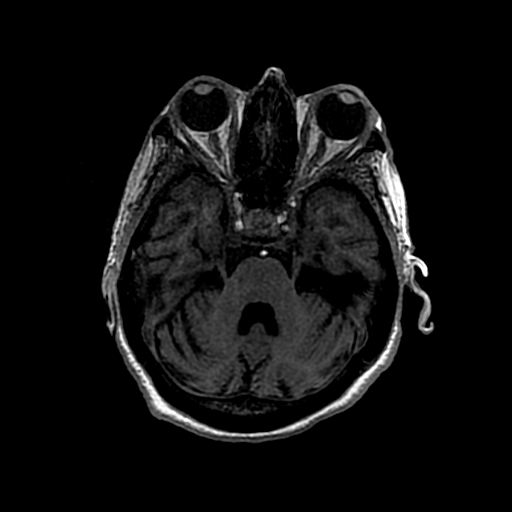
[im 56/124]
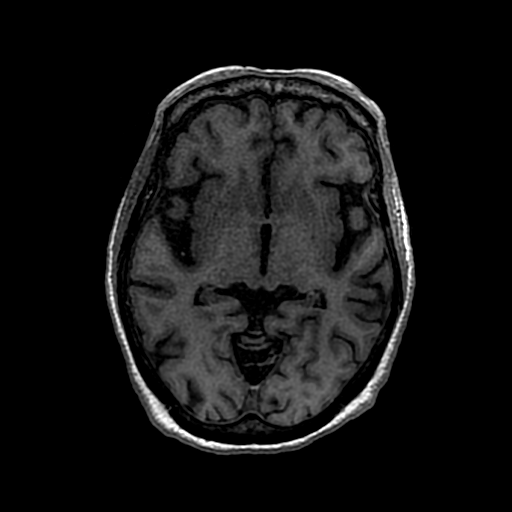
[im 68/124]
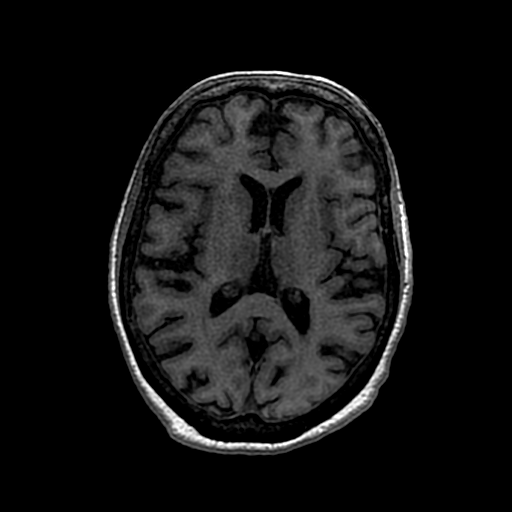
[im 90/124]
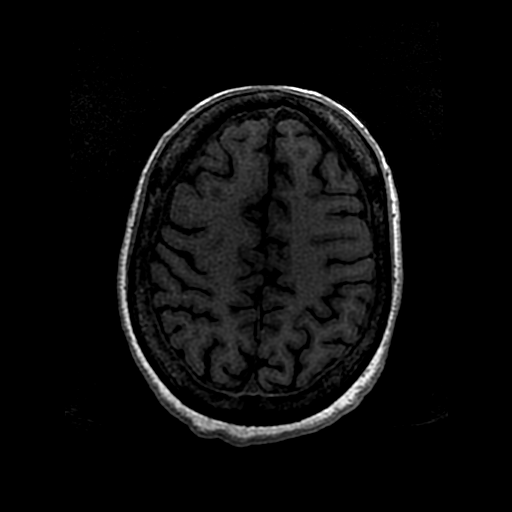
[im 101/124]
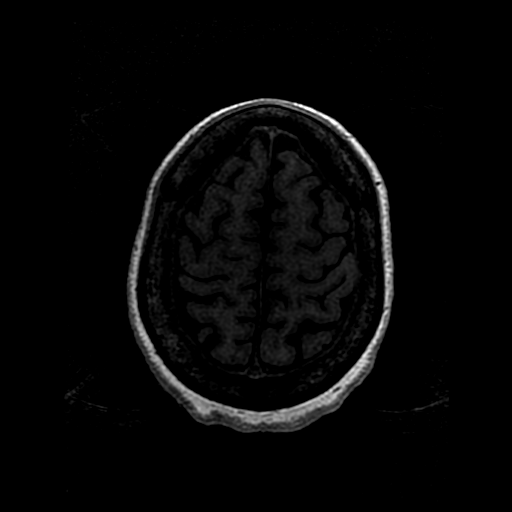

[Series 10: T2 · coronal · 5.0mm · 0.90mm/px · 2 of 25 slices shown (2 of 2)]
[im 1/25]
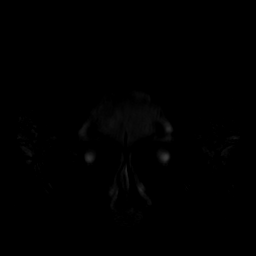
[im 25/25]
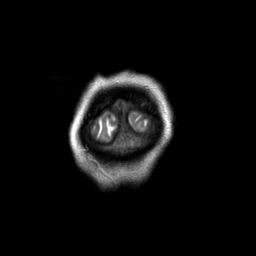

[Series 400: DWI · axial · 3.0mm · 1.09mm/px · z∈[-47,+76]mm · 4 of 43 slices shown (3 of 4)]
[im 1/43]
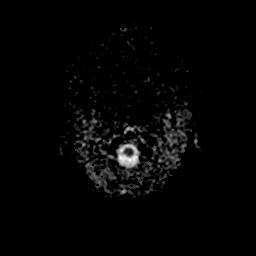
[im 15/43]
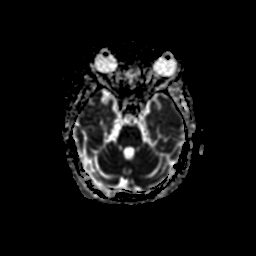
[im 29/43]
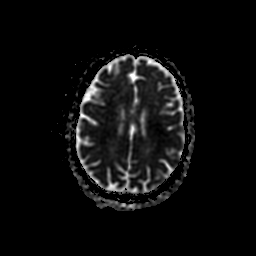
[im 43/43]
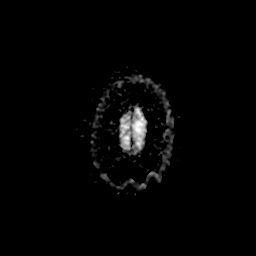

[Series 800: DWI · coronal · 4.0mm · 1.09mm/px · 4 of 39 slices shown (4 of 4)]
[im 1/39]
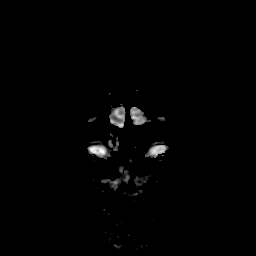
[im 13/39]
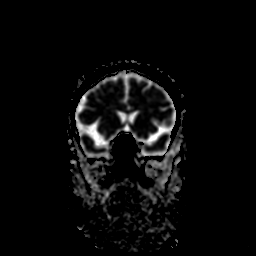
[im 26/39]
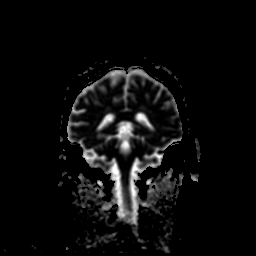
[im 39/39]
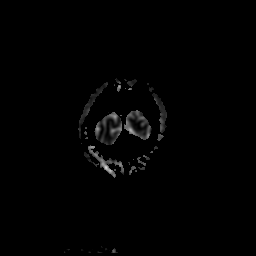

[40 of 48 positions shown; findings below may reference images not displayed]

FINDINGS: Brain: The previous study was moderately degraded by patient motion.
Moderate atrophy and white matter changes are stable.

No acute infarct, hemorrhage, or mass lesion is present. The
ventricles are of normal size. No significant extraaxial fluid
collection is present. The internal auditory canals are within
normal limits. The brainstem and cerebellum are within normal
limits.

Vascular: Flow is present in the major intracranial arteries.

Skull and upper cervical spine: The craniocervical junction is
normal. Upper cervical spine is within normal limits. Marrow signal
is unremarkable. Incidentally noted Tornwaldt cyst is again seen.

Sinuses/Orbits: The paranasal sinuses and mastoid air cells are
clear. The globes and orbits are within normal limits.
IMPRESSION: 1. No acute intracranial abnormality or significant interval change.
2. Stable moderate atrophy and white matter disease. This likely
reflects the sequela of chronic microvascular ischemia.

## 2019-10-24 IMAGING — CT CT HEAD W/O CM
3 series · 16 of 45 positions shown, 19 images · non-contrast
Comparison: [DATE]

CLINICAL DATA: Head trauma with headaches.

EXAM:
CT HEAD WITHOUT CONTRAST
TECHNIQUE: Contiguous axial images were obtained from the base of the skull
through the vertex without intravenous contrast.

[Series 2: head wo · axial · 0.40mm/px · z∈[-230,-115]mm · 10 of 28 slices shown, 13 images]
[im 3/28  brain]
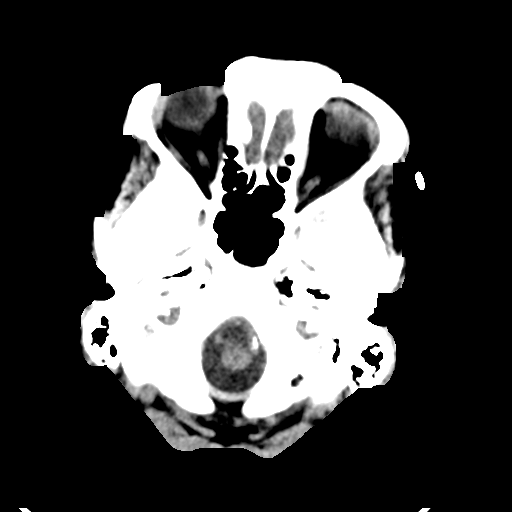
[im 3/28  bone]
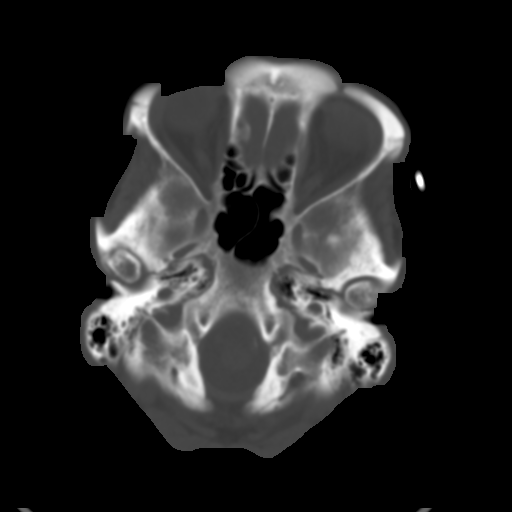
[im 5/28  brain]
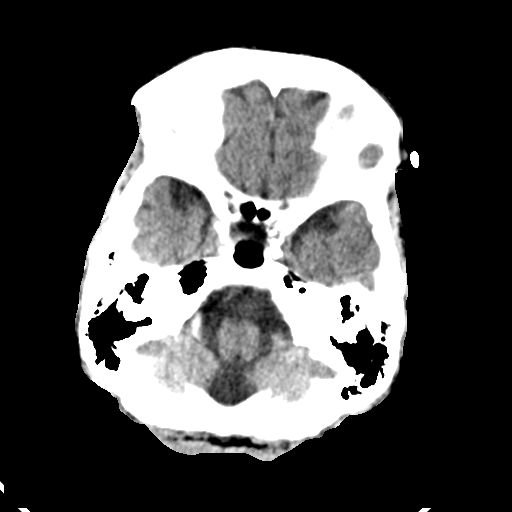
[im 8/28  brain]
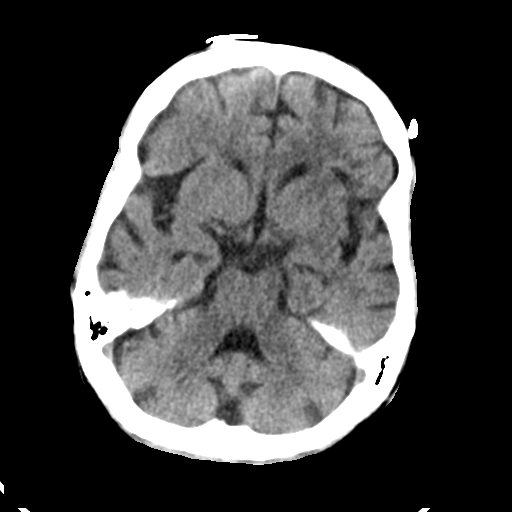
[im 11/28  brain]
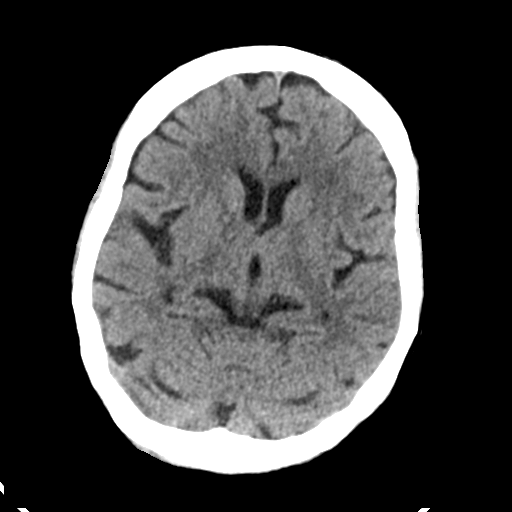
[im 13/28  brain]
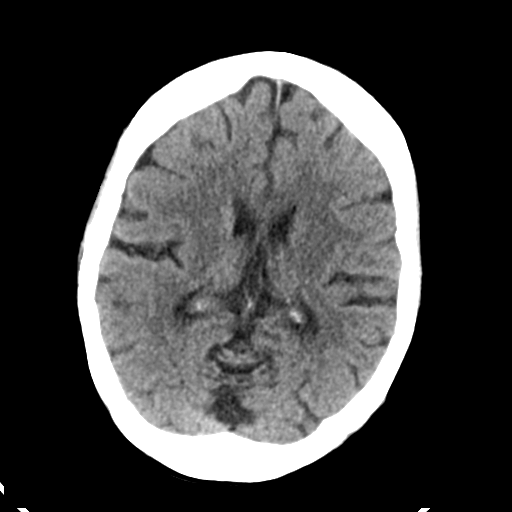
[im 13/28  bone]
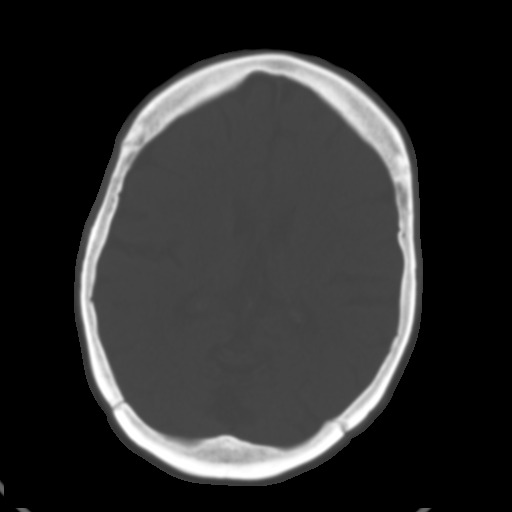
[im 16/28  brain]
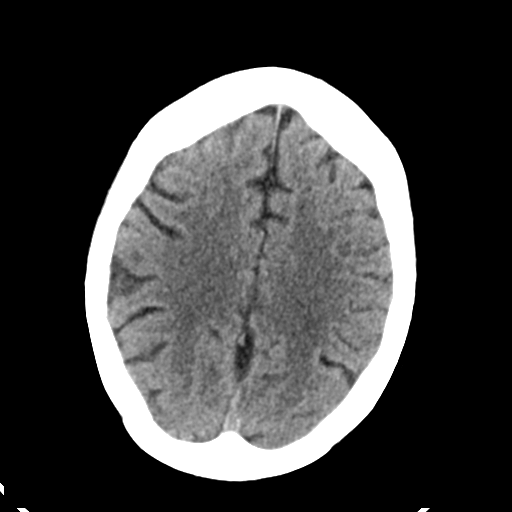
[im 18/28  brain]
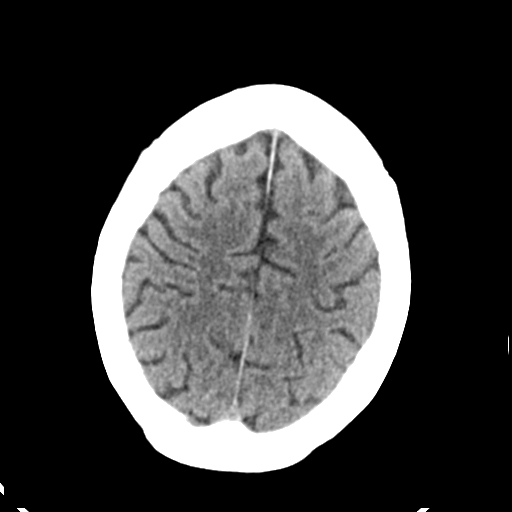
[im 21/28  brain]
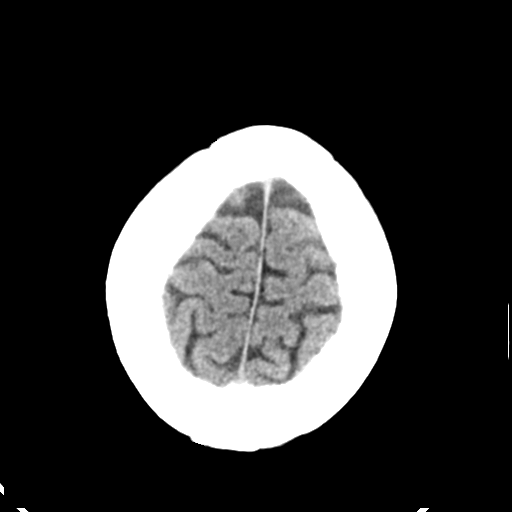
[im 24/28  brain]
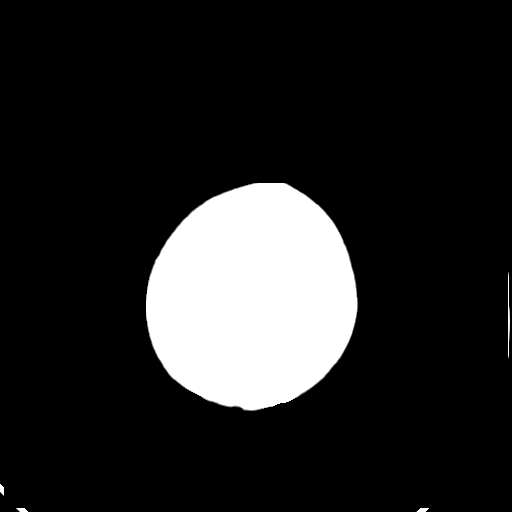
[im 24/28  bone]
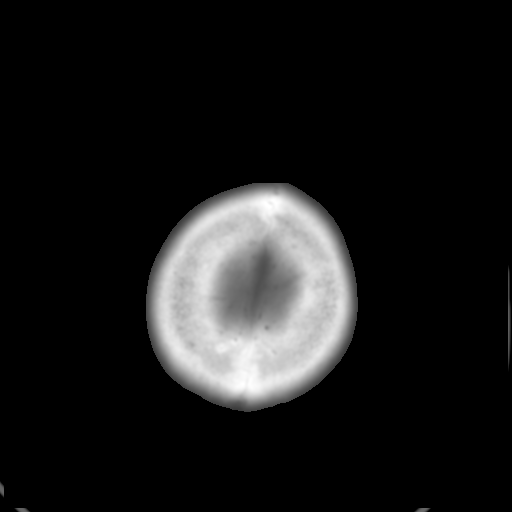
[im 26/28  brain]
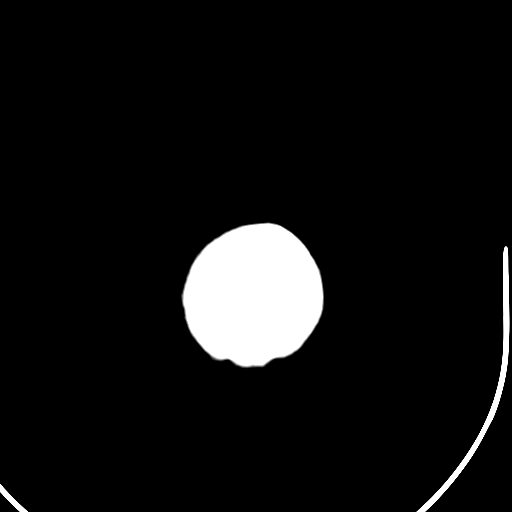

[Series 4: coronal soft tissue · coronal · 0.30mm/px · 3 of 63 slices shown]
[im 21/63  brain]
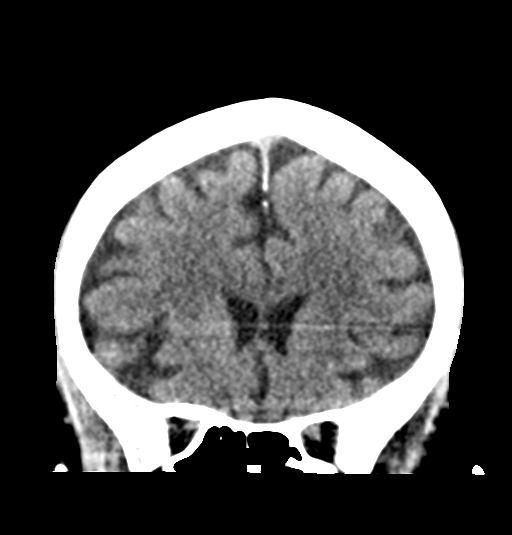
[im 28/63  brain]
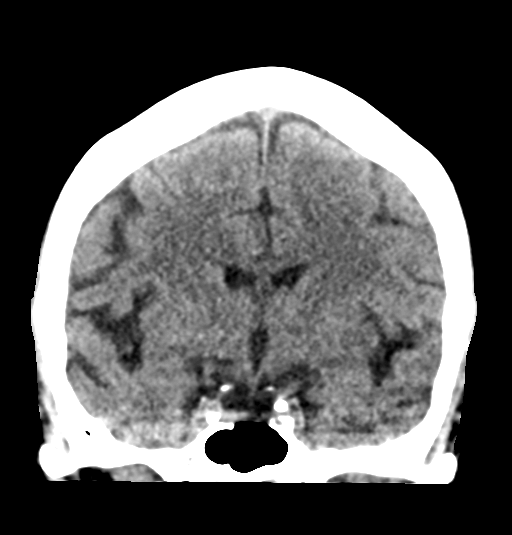
[im 35/63  brain]
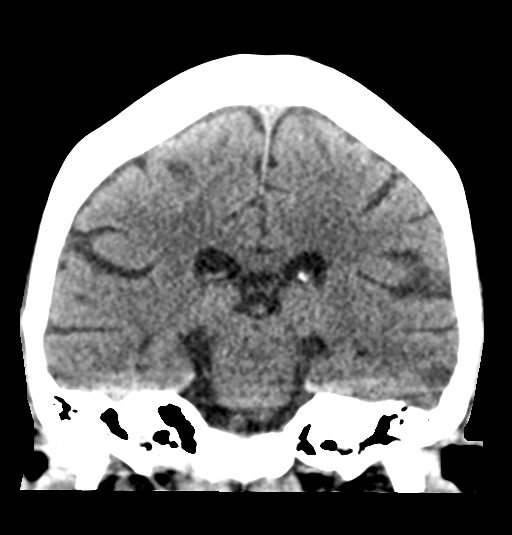

[Series 5: sagittal soft tissue · sagittal · 0.31mm/px · 3 of 52 slices shown]
[im 18/52  brain]
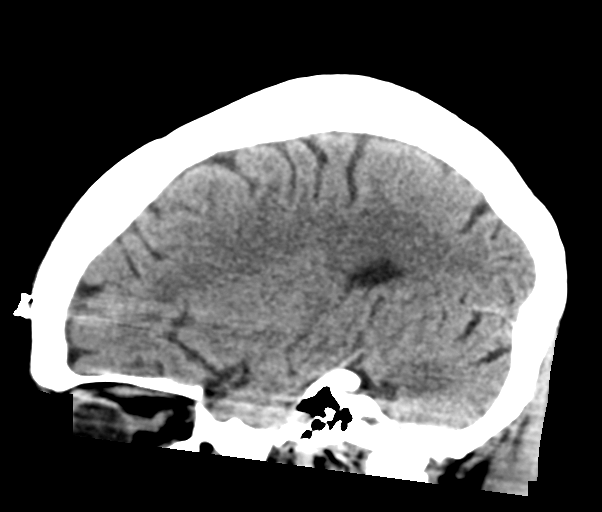
[im 26/52  brain]
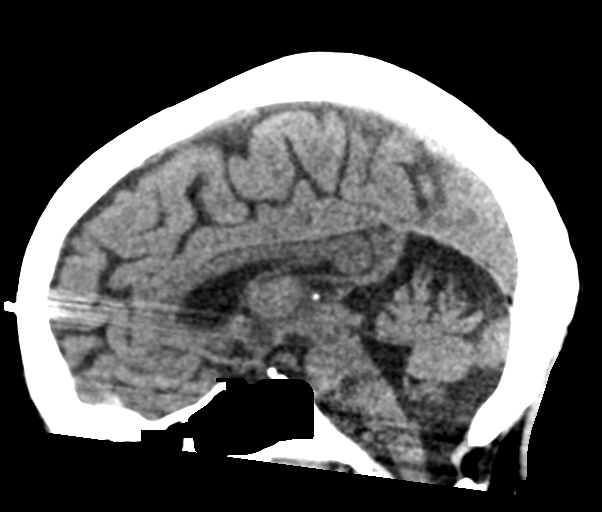
[im 35/52  brain]
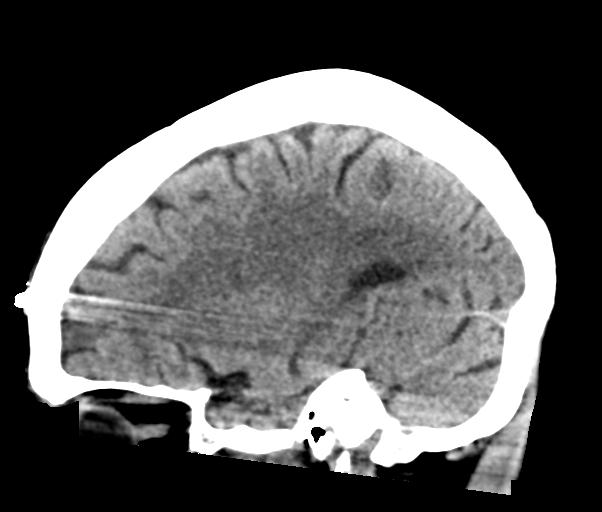

[16 of 45 positions shown; findings below may reference images not displayed]

FINDINGS: Brain: No evidence of acute infarction, hemorrhage, hydrocephalus,
extra-axial collection or mass lesion/mass effect. There is chronic
diffuse atrophy. Chronic bilateral periventricular white matter
small vessel ischemic changes are noted.

Vascular: No hyperdense vessel is identified.

Skull: Normal. Negative for fracture or focal lesion.

Sinuses/Orbits: No acute finding.

Other: None.
IMPRESSION: 1. No focal acute intracranial abnormality identified.
2. Chronic diffuse atrophy. Chronic bilateral periventricular white
matter small vessel ischemic change.

## 2019-10-24 MED ORDER — DIPHENHYDRAMINE HCL 50 MG/ML IJ SOLN
25.0000 mg | Freq: Once | INTRAMUSCULAR | Status: AC
  Administered 2019-10-24: 25 mg via INTRAVENOUS
  Filled 2019-10-24: qty 1

## 2019-10-24 MED ORDER — SODIUM CHLORIDE 0.9 % IV BOLUS
250.0000 mL | Freq: Once | INTRAVENOUS | Status: AC
  Administered 2019-10-24: 250 mL via INTRAVENOUS

## 2019-10-24 MED ORDER — FAMOTIDINE IN NACL 20-0.9 MG/50ML-% IV SOLN
20.0000 mg | Freq: Once | INTRAVENOUS | Status: AC
  Administered 2019-10-24: 20 mg via INTRAVENOUS
  Filled 2019-10-24: qty 50

## 2019-10-24 MED ORDER — LACTATED RINGERS IV BOLUS
500.0000 mL | Freq: Once | INTRAVENOUS | Status: AC
  Administered 2019-10-24: 500 mL via INTRAVENOUS

## 2019-10-24 MED ORDER — POTASSIUM CHLORIDE CRYS ER 20 MEQ PO TBCR
20.0000 meq | EXTENDED_RELEASE_TABLET | Freq: Once | ORAL | Status: AC
  Administered 2019-10-24: 20 meq via ORAL
  Filled 2019-10-24: qty 1

## 2019-10-24 NOTE — Discharge Instructions (Signed)
You have been diagnosed today with dysarthria and hypokalemia.  At this time there does not appear to be the presence of an emergent medical condition, however there is always the potential for conditions to change. Please read and follow the below instructions.  Please return to the Emergency Department immediately for any new or worsening symptoms. Please be sure to follow up with your Primary Care Provider within one week regarding your visit today; please call their office to schedule an appointment even if you are feeling better for a follow-up visit. Please discuss your visit today with your dialysis team and your primary care provider this week and establish follow-up visits.  Please drink plenty of water to avoid dehydration and get plenty of rest.  Get help right away if: Your headache gets very bad quickly. Your headache gets worse after a lot of physical activity. You keep throwing up. You have a stiff neck. You have trouble seeing. You have trouble speaking. You have pain in the eye or ear. Your muscles are weak or you lose muscle control. You lose your balance or have trouble walking. You feel like you will pass out (faint) or you pass out. You are mixed up (confused). You have a seizure. You have chest pain. You have shortness of breath. You have vomiting or diarrhea that lasts for more than 2 days. You faint. You have any new/concerning or worsening of symptoms  Please read the additional information packets attached to your discharge summary.  Do not take your medicine if  develop an itchy rash, swelling in your mouth or lips, or difficulty breathing; call 911 and seek immediate emergency medical attention if this occurs.  Note: Portions of this text may have been transcribed using voice recognition software. Every effort was made to ensure accuracy; however, inadvertent computerized transcription errors may still be present.

## 2019-10-24 NOTE — ED Notes (Signed)
Nurse Secretary has been informed of patient requiring MRI.

## 2019-10-24 NOTE — ED Triage Notes (Signed)
Patient here from dialysis reporting headache today, increased front left. States that she was admitted to the hospital on 12/23 after she fell, became confused, and had slurred speech. States that tongue is still swollen from fall. AAO x 4. Patient is slow to speak and speech is slurred, patient reports that this is not new and is from 12/23, unsure. Difficulty walking, no droop noted.

## 2019-10-24 NOTE — ED Notes (Signed)
Patient ambulated to and from restroom without complication or assistance from staff or device.

## 2019-10-24 NOTE — ED Notes (Signed)
ED PA at patient bedside.

## 2019-10-24 NOTE — ED Provider Notes (Signed)
[facility] DEPT Provider Note   CSN: [id number] Arrival date & time: 10/24/19  1039     History Chief Complaint  Patient presents with  . Headache    Bethany [name] is a 73 y.o. female history ESRD, COPD, hypertension, hyperlipidemia, murmur.  Patient presents today just after dialysis, per triage note patient reports headache today left front, recent admission on 12/23 after a fall and confusion with slurred speech, patient reports tongue swollen from fall, additionally slow speech noted since fall. - On my evaluation patient is resting comfortably no acute distress, patient reports that she has had a "reaction" to her fall on 10/20/2019.  She reports that since that time she has developed gradually worsening tongue swelling and difficulty speaking.  She reports that she has had an intermittent headache for the past several days, last onset of headache was during dialysis today a bilateral burning sensation to the front of her head moderate intensity constant that has since resolved without intervention.  Additionally patient reports a tingling burning numb sensation to her bilateral hands and feet that has been intermittent since her fall earlier this week as well.  Additionally patient reports multiple episodes of nonbloody diarrhea over the past several days.  Patient denies any fever/chills, active headache, vision changes, difficulty swallowing, confusion, neck pain/stiffness, chest pain, difficulty breathing, abdominal pain, vomiting, new fall or any additional concerns.  HPI     Past Medical History:  Diagnosis Date  . Anemia   . Chronic kidney disease   . COPD (chronic obstructive pulmonary disease) ([place]) 10/21/2019  . ESRD on dialysis ([place]) 08/03/2018  . Fall 10/11/2019  . Femoral hernia 06/30/2013   LEFT    . Heart murmur    as a child  . Hernia, femoral    left  . Hyperlipidemia    no meds aken  . Hypertension   . Pneumonia   .  Smoking 10/21/2019    Patient Active Problem List   Diagnosis Date Noted  . COPD (chronic obstructive pulmonary disease) ([place]) 10/21/2019  . Restless leg syndrome 10/21/2019  . Smoking 10/21/2019  . Confusion   . Altered mental status, unspecified 10/20/2019  . Fall 10/11/2019  . ESRD on dialysis [facility]) 08/03/2018    Past Surgical History:  Procedure Laterality Date  . AV FISTULA PLACEMENT Left 08/14/2018   Procedure: INSERTION OF ARTERIOVENOUS GRAFT LEFT ARM;  Surgeon: Serafina Mitchell, MD;  Location: [facility] OR;  Service: Vascular;  Laterality: Left;  . COLONOSCOPY    . TONSILLECTOMY AND ADENOIDECTOMY     at age 89- unsure if adenoids were taken out     OB History   No obstetric history on file.     Family History  Problem Relation Age of Onset  . Heart attack Brother   . Breast cancer Other   . Skin cancer Other   . Colon cancer Neg Hx   . Pancreatic cancer Neg Hx   . Stomach cancer Neg Hx   . Esophageal cancer Neg Hx   . Rectal cancer Neg Hx     Social History   Tobacco Use  . Smoking status: Current Every Day Smoker    Packs/day: 0.25    Types: Cigarettes  . Smokeless tobacco: Never Used  Substance Use Topics  . Alcohol use: Yes    Alcohol/week: 7.0 standard drinks    Types: 7 Glasses of wine per week    Comment: occasional  . Drug use: No    Comment:  marijuana     Home Medications Prior to Admission medications   Medication Sig Start Date End Date Taking? Authorizing Provider  BIOTIN PO Take 1,000-1,500 mcg by mouth daily.    Yes [provider]  multivitamin (RENA-VIT) TABS tablet Take 1 tablet by mouth daily. 08/03/19  Yes [provider]  atenolol (TENORMIN) 50 MG tablet Take 1 tablet (50 mg total) by mouth daily. 10/22/19   Lattie Haw, MD    Allergies    Patient has no known allergies.  Review of Systems   Review of Systems Ten systems are reviewed and are negative for acute change except as noted in the HPI  Physical  Exam Updated Vital Signs BP 97/63 (BP Location: Right Arm)   Pulse (!) 59   Temp 97.7 F (36.5 C) (Oral)   Resp 18   SpO2 92%   Physical Exam Constitutional:      General: She is not in acute distress.    Appearance: Normal appearance. She is well-developed. She is not ill-appearing or diaphoretic.  HENT:     Head: Normocephalic and atraumatic.     Right Ear: External ear normal.     Left Ear: External ear normal.     Nose: Nose normal.  Eyes:     General: Vision grossly intact. Gaze aligned appropriately.     Pupils: Pupils are equal, round, and reactive to light.  Neck:     Trachea: Trachea and phonation normal. No tracheal deviation.  Pulmonary:     Effort: Pulmonary effort is normal. No respiratory distress.  Abdominal:     General: There is no distension.     Palpations: Abdomen is soft.     Tenderness: There is no abdominal tenderness. There is no guarding or rebound.  Musculoskeletal:        General: Normal range of motion.     Cervical back: Normal range of motion.  Skin:    General: Skin is warm and dry.  Neurological:     Mental Status: She is alert.     GCS: GCS eye subscore is 4. GCS verbal subscore is 5. GCS motor subscore is 6.     Comments: Speech questionably mildly slow/slurred, patient reports this is due to her tongue swelling and has been ongoing for the past 3-4 days, she is goal oriented and follows commands appropriately. Major Cranial nerves without deficit, no facial droop Normal strength in upper and lower extremities bilaterally including dorsiflexion and plantar flexion, strong and equal grip strength Sensation normal to light and sharp touch Moves extremities without ataxia, coordination intact No pronator drift Normal gait  Psychiatric:        Behavior: Behavior normal.     ED Results / Procedures / Treatments   Labs (all labs ordered are listed, but only abnormal results are displayed) Labs Reviewed  APTT - Abnormal; Notable for the  following components:      Result Value   aPTT 39 (*)    All other components within normal limits  CBC - Abnormal; Notable for the following components:   WBC 10.9 (*)    RBC 2.85 (*)    Hemoglobin 9.2 (*)    HCT 30.2 (*)    MCV 106.0 (*)    All other components within normal limits  DIFFERENTIAL - Abnormal; Notable for the following components:   Neutro Abs 7.8 (*)    Monocytes Absolute 1.2 (*)    All other components within normal limits  COMPREHENSIVE METABOLIC PANEL -  Abnormal; Notable for the following components:   Potassium 2.8 (*)    Chloride 94 (*)    Creatinine, Ser 2.97 (*)    Calcium 8.2 (*)    Total Protein 6.2 (*)    Albumin 2.9 (*)    AST 86 (*)    GFR calc non Af Amer 15 (*)    GFR calc Af Amer 18 (*)    All other components within normal limits  ETHANOL  PROTIME-INR  RAPID URINE DRUG SCREEN, HOSP PERFORMED  URINALYSIS, ROUTINE W REFLEX MICROSCOPIC  I-STAT CHEM 8, ED    EKG EKG Interpretation  Date/Time:  Sunday October 24 2019 12:37:50 EST Ventricular Rate:  71 PR Interval:    QRS Duration: 140 QT Interval:  512 QTC Calculation: 472 R Axis:   67 Text Interpretation: Sinus rhythm Probable left atrial enlargement Nonspecific intraventricular conduction delay Borderline T abnormalities, lateral leads Poor data quality Since last tracing rate slower Confirmed by Dorie Rank [phone]) on 10/24/2019 12:42:37 PM   Radiology CT Head Wo Contrast  Result Date: 10/24/2019 CLINICAL DATA:  Head trauma with headaches. EXAM: CT HEAD WITHOUT CONTRAST TECHNIQUE: Contiguous axial images were obtained from the base of the skull through the vertex without intravenous contrast. COMPARISON:  October 20, 2019 FINDINGS: Brain: No evidence of acute infarction, hemorrhage, hydrocephalus, extra-axial collection or mass lesion/mass effect. There is chronic diffuse atrophy. Chronic bilateral periventricular white matter small vessel ischemic changes are noted. Vascular: No  hyperdense vessel is identified. Skull: Normal. Negative for fracture or focal lesion. Sinuses/Orbits: No acute finding. Other: None. IMPRESSION: 1. No focal acute intracranial abnormality identified. 2. Chronic diffuse atrophy. Chronic bilateral periventricular white matter small vessel ischemic change. Electronically Signed   By: Abelardo Diesel M.D.   On: 10/24/2019 13:00   MR BRAIN WO CONTRAST  Result Date: 10/24/2019 CLINICAL DATA:  New onset left frontal headache. Patient recently hospitalized 12/23 after a fall with confusion and slurred speech. EXAM: MRI HEAD WITHOUT CONTRAST TECHNIQUE: Multiplanar, multiecho pulse sequences of the brain and surrounding structures were obtained without intravenous contrast. COMPARISON:  CT head without contrast 10/24/2019. MR head without contrast 10/20/2019 FINDINGS: Brain: The previous study was moderately degraded by patient motion. Moderate atrophy and white matter changes are stable. No acute infarct, hemorrhage, or mass lesion is present. The ventricles are of normal size. No significant extraaxial fluid collection is present. The internal auditory canals are within normal limits. The brainstem and cerebellum are within normal limits. Vascular: Flow is present in the major intracranial arteries. Skull and upper cervical spine: The craniocervical junction is normal. Upper cervical spine is within normal limits. Marrow signal is unremarkable. 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 Sinuses/Orbits: The paranasal sinuses and mastoid air cells are clear. The globes and orbits are within normal limits. IMPRESSION: 1. No acute intracranial abnormality or significant interval change. 2. Stable moderate atrophy and white matter disease. This likely reflects the sequela of chronic microvascular ischemia. Electronically Signed   By: San Morelle M.D.   On: 10/24/2019 15:52    Procedures Procedures (including critical care time)  Medications Ordered in  ED Medications  potassium chloride SA (KLOR-CON) CR tablet 20 mEq (has no administration in time range)  diphenhydrAMINE (BENADRYL) injection 25 mg (25 mg Intravenous Given 10/24/19 1241)  famotidine (PEPCID) IVPB 20 mg premix (0 mg Intravenous Stopped 10/24/19 1311)  sodium chloride 0.9 % bolus 250 mL (0 mLs Intravenous Stopped 10/24/19 1359)  lactated ringers bolus 500 mL (0 mLs Intravenous  Stopped 10/24/19 1528)    ED Course  I have reviewed the triage vital signs and the nursing notes.  Pertinent labs & imaging results that were available during my care of the patient were reviewed by me and considered in my medical decision making (see chart for details).    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 Patient had been admitted on 10/20/2019 for altered mental status last known normal 10/18/2019.  CT head negative for acute bleed.  Neurology was consulted and recommended MRI and metabolic work-up, MRI was negative for stroke.  Patient's mental status and asterixis improved following dialysis. Patient was recommended skilled nursing facility but declined. --- Today patient presents with complaint of worsening tongue swelling and speech x3-4 days, she reports this is a "late reaction" to her fall.  On evaluation patient is well-appearing in no acute distress she is fully alert and oriented and without facial droop.  She reports tongue swelling, she does have a large tongue but it is not protruding and there are no signs of allergic reaction today, she is protecting her airway and able to swallow without difficulty.  Moves extremities equally bilaterally, ambulates without assistance to bathroom.  Will obtain labs and CT head at this time in addition to giving small fluid bolus, Benadryl and Pepcid.  Patient's blood pressure noted to  be low 80s/50s but she is mentating appropriately and ambulating around the emergency department without difficulty.  Patient with full dialysis session just prior to arrival. - PT/INR within normal limits Ethanol negative CBC with mild leukocytosis 10.9 with left shift, hemoglobin 9.2 slightly worsened from prior. APTT 39 CMP with potassium 2.8, creatinine 2.97 CT head:  IMPRESSION:  1. No focal acute intracranial abnormality identified.  2. Chronic diffuse atrophy. Chronic bilateral periventricular white  matter small vessel ischemic change.   EKG: Sinus rhythm Probable left atrial enlargement Nonspecific intraventricular conduction delay Borderline T abnormalities, lateral leads Poor data quality Since last tracing rate slower Confirmed by Dorie Rank [phone]) on 10/24/2019 12:42:37 PM - Work-up above shows CMP consistent with patient's known ESRD, potassium slightly low possibly secondary to dialysis and recent diarrhea.  She has a mild leukocytosis which is not present on discharge last, possibly secondary to diarrhea patient without other infectious-like symptoms and is afebrile, no indication for antibiotics at this time.  Hemoglobin 9.2 slightly below prior, again may be secondary to recent dialysis session, she denies melena or frank blood in the stool.  Patient reevaluated resting comfortably no acute distress, airway patent, no new complaints. - Patient seen and evaluated by Dr. Billy Fischer, MRI brain and additional fluid bolus ordered, pending no acute findings anticipate discharge. - MRI brain:  IMPRESSION:  1. No acute intracranial abnormality or significant interval change.  2. Stable moderate atrophy and white matter disease. This likely  reflects the sequela of chronic microvascular ischemia.  - Patient reassessed resting comfortably no acute distress reports she is feeling well at this time, no complaints.  No change to speech.  She is tolerating p.o. without difficulty.   Speaking with her friend on the phone.  She has been updated on findings as above and states understanding.  No evidence of allergic reaction, there is no indication to discharge patient with antihistamines, steroids or EpiPen at this time.  Tongue  appears to be normal size per patient, no evidence of angioedema.  She is no symptoms suggestive of infection and there is no indication for antibiotics.  Patient's tingling and burning of extremities may be due to neuropathy and possibly hypokalemia.  She will be given a small dose of p.o. potassium today, will avoid extended supplementation due to ESRD status.  I recommend patient get healthy diet and continue p.o. hydration to avoid dehydration.  She is to follow-up with her PCP regarding her loose stools.  Patient has dialysis session tomorrow due to the new year, advised patient to discuss visit today with both her PCP and to inform dialysis team of recent ED visit.  At this time there does not appear to be any evidence of an acute emergency medical condition and the patient appears stable for discharge with appropriate outpatient follow up. Diagnosis was discussed with patient who verbalizes understanding of care plan and is agreeable to discharge. I have discussed return precautions with patient who verbalizes understanding of return precautions. Patient encouraged to follow-up with their PCP. All questions answered.  Patient has been discharged in good condition.  Case was discussed with Dr. Tomi Bamberger who is attending after shift change, agrees with single-dose potassium p.o. and discharge.  Note: Portions of this report may have been transcribed using voice recognition software. Every effort was made to ensure accuracy; however, inadvertent computerized transcription errors may still be present. Final Clinical Impression(s) / ED Diagnoses Final diagnoses:  Dysarthria  Hypokalemia    Rx / DC Orders ED Discharge Orders    None       Gari Crown 10/24/19 1658    Gareth Morgan, MD 10/25/19 1332

## 2019-10-24 NOTE — ED Notes (Signed)
Patient transported to CT 

## 2019-10-24 NOTE — ED Notes (Signed)
Patient transported to MRI 

## 2019-12-24 ENCOUNTER — Emergency Department (HOSPITAL_COMMUNITY): Payer: Medicare HMO

## 2019-12-24 ENCOUNTER — Emergency Department (HOSPITAL_COMMUNITY)
Admission: EM | Admit: 2019-12-24 | Discharge: 2019-12-24 | Disposition: A | Discharge status: Home / Self Care | Payer: Medicare HMO | Attending: Emergency Medicine | Admitting: Emergency Medicine

## 2019-12-24 ENCOUNTER — Encounter (HOSPITAL_COMMUNITY): Payer: Self-pay | Admitting: Emergency Medicine

## 2019-12-24 DIAGNOSIS — R0602 Shortness of breath: Secondary | ICD-10-CM | POA: Diagnosis present

## 2019-12-24 DIAGNOSIS — F17210 Nicotine dependence, cigarettes, uncomplicated: Secondary | ICD-10-CM | POA: Diagnosis not present

## 2019-12-24 DIAGNOSIS — J449 Chronic obstructive pulmonary disease, unspecified: Secondary | ICD-10-CM | POA: Diagnosis not present

## 2019-12-24 DIAGNOSIS — N186 End stage renal disease: Secondary | ICD-10-CM

## 2019-12-24 DIAGNOSIS — Z20822 Contact with and (suspected) exposure to covid-19: Secondary | ICD-10-CM | POA: Diagnosis not present

## 2019-12-24 DIAGNOSIS — Z992 Dependence on renal dialysis: Secondary | ICD-10-CM | POA: Diagnosis not present

## 2019-12-24 DIAGNOSIS — J9601 Acute respiratory failure with hypoxia: Secondary | ICD-10-CM | POA: Diagnosis not present

## 2019-12-24 DIAGNOSIS — J810 Acute pulmonary edema: Secondary | ICD-10-CM | POA: Insufficient documentation

## 2019-12-24 DIAGNOSIS — E875 Hyperkalemia: Secondary | ICD-10-CM

## 2019-12-24 DIAGNOSIS — Z79899 Other long term (current) drug therapy: Secondary | ICD-10-CM | POA: Diagnosis not present

## 2019-12-24 LAB — BASIC METABOLIC PANEL
Anion gap: 18 — ABNORMAL HIGH (ref 5–15)
BUN: 129 mg/dL — ABNORMAL HIGH (ref 8–23)
CO2: 12 mmol/L — ABNORMAL LOW (ref 22–32)
Calcium: 9.4 mg/dL (ref 8.9–10.3)
Chloride: 109 mmol/L (ref 98–111)
Creatinine, Ser: 10.62 mg/dL — ABNORMAL HIGH (ref 0.44–1.00)
GFR calc Af Amer: 4 mL/min — ABNORMAL LOW (ref >60)
GFR calc non Af Amer: 3 mL/min — ABNORMAL LOW (ref >60)
Glucose, Bld: 149 mg/dL — ABNORMAL HIGH (ref 70–99)
Potassium: 6.5 mmol/L (ref 3.5–5.1)
Sodium: 139 mmol/L (ref 135–145)

## 2019-12-24 LAB — CBC WITH DIFFERENTIAL/PLATELET
Abs Immature Granulocytes: 0.03 10*3/uL (ref 0.00–0.07)
Basophils Absolute: 0 10*3/uL (ref 0.0–0.1)
Basophils Relative: 0 %
Eosinophils Absolute: 0 10*3/uL (ref 0.0–0.5)
Eosinophils Relative: 0 %
HCT: 33.5 % — ABNORMAL LOW (ref 36.0–46.0)
Hemoglobin: 10.4 g/dL — ABNORMAL LOW (ref 12.0–15.0)
Immature Granulocytes: 0 %
Lymphocytes Relative: 19 %
Lymphs Abs: 1.3 10*3/uL (ref 0.7–4.0)
MCH: 30.5 pg (ref 26.0–34.0)
MCHC: 31 g/dL (ref 30.0–36.0)
MCV: 98.2 fL (ref 80.0–100.0)
Monocytes Absolute: 0.8 10*3/uL (ref 0.1–1.0)
Monocytes Relative: 11 %
Neutro Abs: 4.7 10*3/uL (ref 1.7–7.7)
Neutrophils Relative %: 70 %
Platelets: 311 10*3/uL (ref 150–400)
RBC: 3.41 MIL/uL — ABNORMAL LOW (ref 3.87–5.11)
RDW: 15.8 % — ABNORMAL HIGH (ref 11.5–15.5)
WBC: 6.8 10*3/uL (ref 4.0–10.5)
nRBC: 0 % (ref 0.0–0.2)

## 2019-12-24 LAB — I-STAT CHEM 8, ED
BUN: >130 mg/dL — ABNORMAL HIGH (ref 8–23)
Calcium, Ion: 1.1 mmol/L — ABNORMAL LOW (ref 1.15–1.40)
Chloride: 113 mmol/L — ABNORMAL HIGH (ref 98–111)
Creatinine, Ser: 12.1 mg/dL — ABNORMAL HIGH (ref 0.44–1.00)
Glucose, Bld: 141 mg/dL — ABNORMAL HIGH (ref 70–99)
HCT: 32 % — ABNORMAL LOW (ref 36.0–46.0)
Hemoglobin: 10.9 g/dL — ABNORMAL LOW (ref 12.0–15.0)
Potassium: 6.5 mmol/L (ref 3.5–5.1)
Sodium: 136 mmol/L (ref 135–145)
TCO2: 13 mmol/L — ABNORMAL LOW (ref 22–32)

## 2019-12-24 LAB — RESPIRATORY PANEL BY RT PCR (FLU A&B, COVID)
Influenza A by PCR: NEGATIVE
Influenza B by PCR: NEGATIVE
SARS Coronavirus 2 by RT PCR: NEGATIVE

## 2019-12-24 LAB — BRAIN NATRIURETIC PEPTIDE: B Natriuretic Peptide: >4500 pg/mL — ABNORMAL HIGH (ref 0.0–100.0)

## 2019-12-24 IMAGING — DX DG CHEST 1V PORT
1 series · 1 of 1 positions shown · non-contrast
Comparison: [DATE]

CLINICAL DATA: Shortness of breath.  Chronic renal failure

EXAM:
PORTABLE CHEST 1 VIEW

[chest ap]
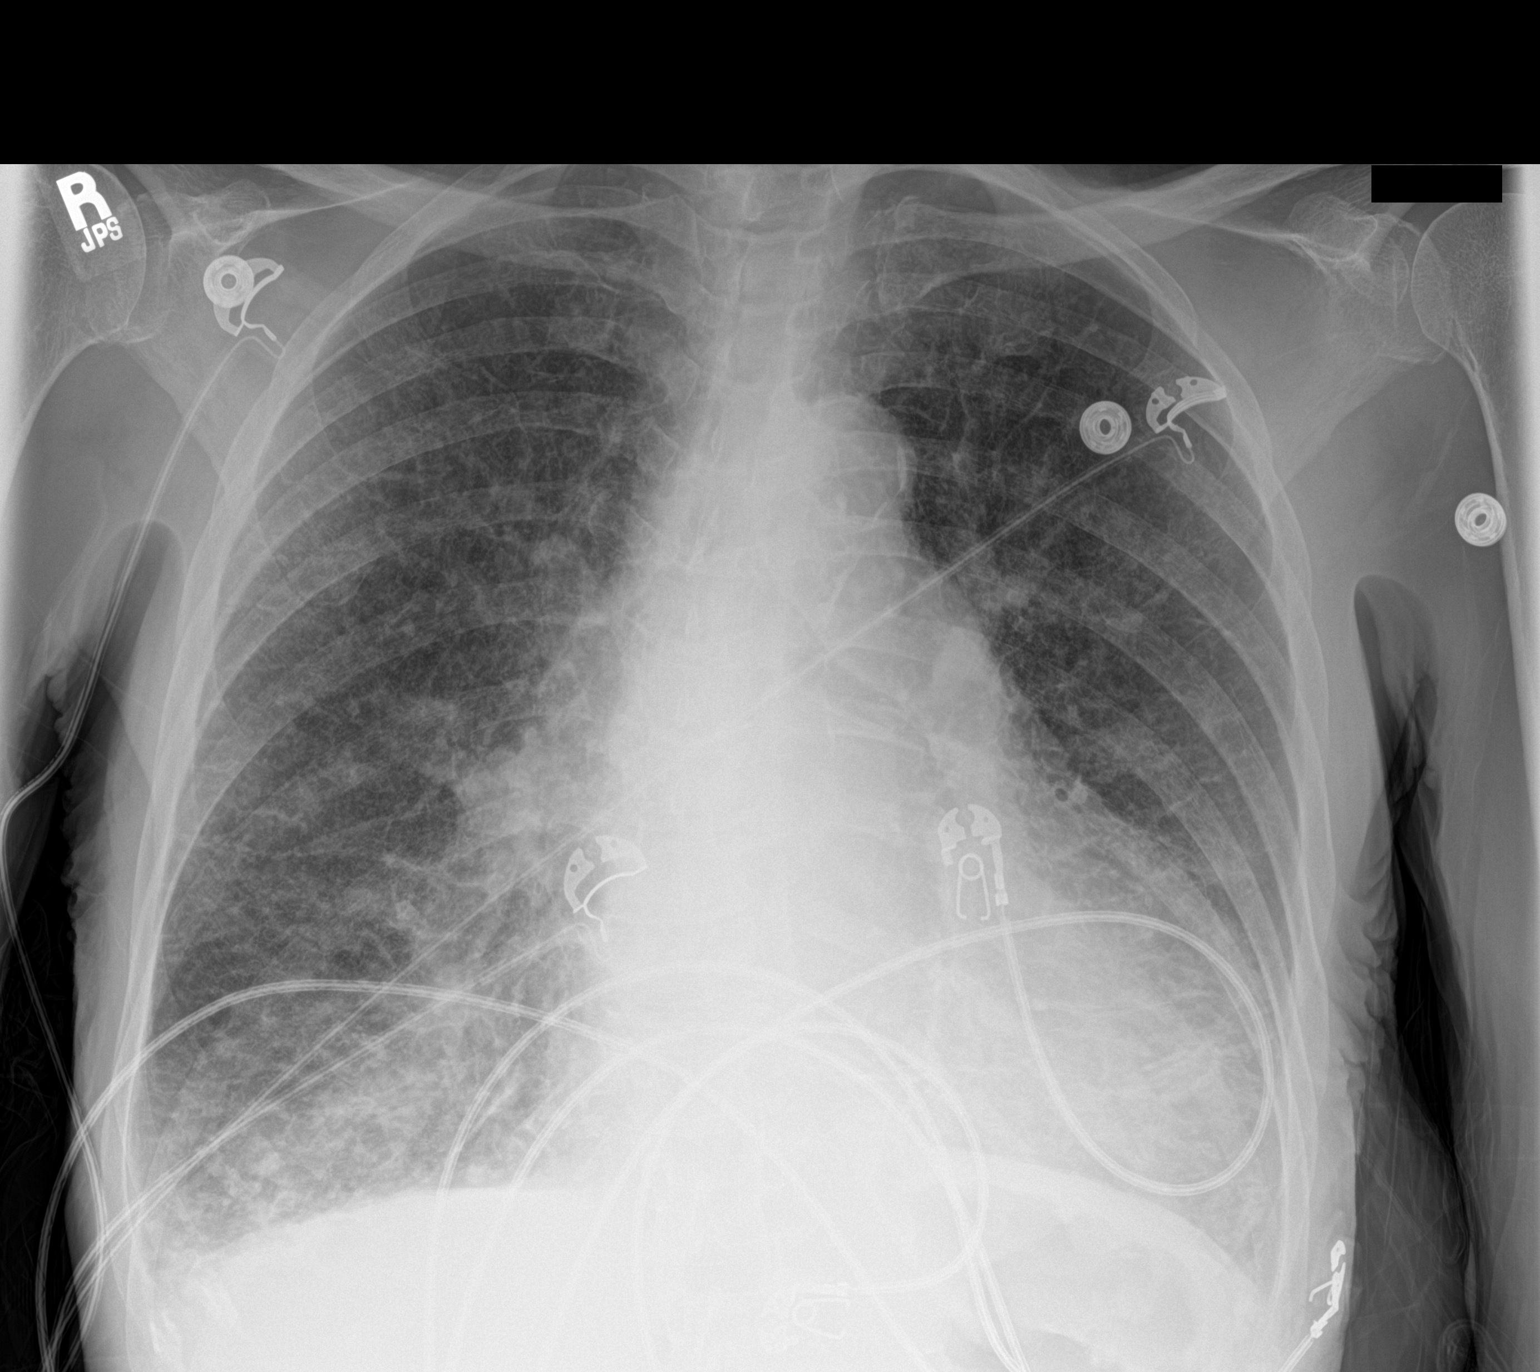

[1 of 1 positions shown; findings below may reference images not displayed]

FINDINGS: There is cardiomegaly with pulmonary venous hypertension. There is
diffuse interstitial edema, most notably in the lung bases. There is
airspace opacity in the right base. There is a minimal right pleural
effusion.

No adenopathy.  There is aortic atherosclerosis.  No bone lesions.
IMPRESSION: 1. Cardiomegaly with pulmonary vascular congestion and interstitial
edema. Findings consistent with volume overload/congestive heart
failure.

2. Airspace opacity right base, likely superimposed pneumonia.
Aspiration could present similarly. Alveolar edema is possible in
this area as well.

3.  Aortic Atherosclerosis ([T3]-[T3]).

## 2019-12-24 MED ORDER — CHLORHEXIDINE GLUCONATE CLOTH 2 % EX PADS: 6.0000 | MEDICATED_PAD | Freq: Every day | CUTANEOUS | Status: DC

## 2019-12-24 NOTE — ED Provider Notes (Signed)
Patient returned back to the emergency department after dialysis treatment for the shortness of breath.  After dialysis, patient was breathing much better.  She was now on room air at rest and did not have hypoxia.  When she tried to ambulate she did drop into the upper 80s however patient would like to go home.  I spoke with her nephrologist today, Dr. Burnett Sheng, who felt that she was appropriate for discharge home at this time.  He recommended follow-up with her previous nephrology and PCP team as well as continue her outpatient dialysis plan.  Patient is agreeable this plan understands return precautions.  Patient discharged in good condition.  Clinical Impression: 1. ESRD on hemodialysis ([place])   2. Acute pulmonary edema ([facility])   3. Hyperkalemia   4. Acute respiratory failure with hypoxia ([facility])     Disposition: Discharge  Condition: Good  I have discussed the results, Dx and Tx plan with the pt(& family if present). He/she/they expressed understanding and agree(s) with the plan. Discharge instructions discussed at great length. Strict return precautions discussed and pt &/or family have verbalized understanding of the instructions. No further questions at time of discharge.    New Prescriptions   No medications on file    Follow Up: Your PCP and Dialysis team        Kaydan Wong, Gwenyth Allegra, MD 12/24/19 1655

## 2019-12-24 NOTE — Procedures (Addendum)
Pt doing well on HD. She will be ok for dc after HD if breathing is better and pt is stable.   I was present at this dialysis session, have reviewed the session itself and made  appropriate changes Kelly Splinter MD [place] pager [phone]   12/24/2019, 2:18 PM

## 2019-12-24 NOTE — ED Notes (Signed)
Transported to dialysis , Report given to RN

## 2019-12-24 NOTE — ED Notes (Signed)
2 RN's attempted IV unsuccessfully Iv team consulted

## 2019-12-24 NOTE — Discharge Instructions (Signed)
You were able to get dialysis today which improved your breathing and took fluid off your lungs.  After the treatment, at rest, your oxygen was normal however when you try to ambulate it did drop into the 80s again.  We had a shared decision-making conversation and you would like to go home.  Given your lack of respiratory distress at this time, we feel it is reasonable.  Please follow-up with your PCP and your dialysis team and if any symptoms change or worsen, please return to the nearest emergency department immediately.Marland Kitchen

## 2019-12-24 NOTE — Consult Note (Signed)
Renal Service Consult Note [facility]  [facility] 12/24/2019 Sol Blazing Requesting Physician:  Dr Gilford Raid  Reason for Consult:  ESRD pt missed HD w/ pulm edema HPI: The patient is a 74 y.o. year-old with hx of PNA, HTN, HL, COPD and ESRD on HD TTS. She missed Tues HD and poss last Sat's HD as well. Pt can't remember.  No transport issues, pt states, "I'm 73 yo and getting tired".  Asked to see for dialysis.   Pt breathing hard, not in distress though.  CXR shows IS edema, background COPD.  +smoker.  No CP , chills, fever or prod cough.   Was admitted Dec 2020 w/ AMS and myoclonus which resolved w/ HD. Pt refused SNF and was dcd' home.     ROS  denies CP  no joint pain   no HA  no blurry vision  no rash  no diarrhea  no nausea/ vomiting    Past Medical History  Past Medical History:  Diagnosis Date  . Anemia   . Chronic kidney disease   . COPD (chronic obstructive pulmonary disease) ([place]) 10/21/2019  . ESRD on dialysis ([place]) 08/03/2018  . Fall 10/11/2019  . Femoral hernia 06/30/2013   LEFT    . Heart murmur    as a child  . Hernia, femoral    left  . Hyperlipidemia    no meds aken  . Hypertension   . Pneumonia   . Smoking 10/21/2019   Past Surgical History  Past Surgical History:  Procedure Laterality Date  . AV FISTULA PLACEMENT Left 08/14/2018   Procedure: INSERTION OF ARTERIOVENOUS GRAFT LEFT ARM;  Surgeon: Serafina Mitchell, MD;  Location: [facility] OR;  Service: Vascular;  Laterality: Left;  . COLONOSCOPY    . TONSILLECTOMY AND ADENOIDECTOMY     at age 48- unsure if adenoids were taken out   Family History  Family History  Problem Relation Age of Onset  . Heart attack Brother   . Breast cancer Other   . Skin cancer Other   . Colon cancer Neg Hx   . Pancreatic cancer Neg Hx   . Stomach cancer Neg Hx   . Esophageal cancer Neg Hx   . Rectal cancer Neg Hx    Social History  reports that she has been smoking cigarettes. She has been  smoking about 0.25 packs per day. She has never used smokeless tobacco. She reports current alcohol use of about 7.0 standard drinks of alcohol per week. She reports that she does not use drugs. Allergies No Known Allergies Home medications Prior to Admission medications   Medication Sig Start Date End Date Taking? Authorizing Provider  Biotin 1000 MCG CHEW Chew 1,000 mcg by mouth daily.    Yes [provider]  multivitamin (RENA-VIT) TABS tablet Take 1 tablet by mouth daily. 08/03/19  Yes [provider]  atenolol (TENORMIN) 50 MG tablet Take 1 tablet (50 mg total) by mouth daily. Patient not taking: Reported on 12/24/2019 10/22/19   Lattie Haw, MD     Vitals:   12/24/19 0844 12/24/19 0915  BP: (!) 200/117 (!) 200/112  Pulse: (!) 108 (!) 104  Resp: (!) 24 (!) 22  Temp: 98.4 F (36.9 C)   TempSrc: Oral   SpO2: 95% 93%   Exam Gen pt SOB, RR 25, not in severe distress No rash, cyanosis or gangrene Sclera anicteric, throat clear No jvd or bruits Chest no sig rales or wheezing poor air movement throughout RRR  no MRG Abd soft ntnd no mass or ascites +bs GU defer MS no joint effusions or deformity Ext no sig LE edema, no wounds or ulcers Neuro is alert, Ox 3 , nf  LUA AVG +bruit    Home meds:  - atenolol 50 qd    Outpt HD: TTS [facility]     4h     400/800   2/2 bath  Hep none  LUA AVG   CXR  Today > showing diffuse IS edema, background COPD.   Assessment/ Plan: 1. Acute resp distress - due to pulm edema, has underlying sig COPD.  Will need HD w/ vol removal asap this am.  See orders.  2. Hyperkalemia - mod severe, low K bath w/ HD 3. ESRD - on HD TTS. Compliance an issue, will need to consider [place],  in OP setting most likely.  4. HTN - BP's up, lower vol , atenolol 5. COPD      Kelly Splinter  MD 12/24/2019, 10:07 AM  Recent Labs  Lab 12/24/19 0858 12/24/19 0903  WBC 6.8  --   HGB 10.4* 10.9*   Recent Labs  Lab 12/24/19 0858 12/24/19 0903  K  6.5* 6.5*  BUN 129* >130*  CREATININE 10.62* 12.10*  CALCIUM 9.4  --

## 2019-12-24 NOTE — ED Provider Notes (Signed)
[facility] EMERGENCY DEPARTMENT Provider Note   CSN: [id number] Arrival date & time: 12/24/19  6644     History No chief complaint on file.   Bethany [name] is a 74 y.o. female.  Pt presents to the ED today with sob.  Pt has a hx of ESRD on a TTS schedule.  She has not gone to dialysis this week.  She said she did not go because she did not have the energy.  She denies any f/c.  No cough.  No known covid exposures.        Past Medical History:  Diagnosis Date  . Anemia   . Chronic kidney disease   . COPD (chronic obstructive pulmonary disease) ([place]) 10/21/2019  . ESRD on dialysis ([place]) 08/03/2018  . Fall 10/11/2019  . Femoral hernia 06/30/2013   LEFT    . Heart murmur    as a child  . Hernia, femoral    left  . Hyperlipidemia    no meds aken  . Hypertension   . Pneumonia   . Smoking 10/21/2019    Patient Active Problem List   Diagnosis Date Noted  . COPD (chronic obstructive pulmonary disease) ([place]) 10/21/2019  . Restless leg syndrome 10/21/2019  . Smoking 10/21/2019  . Confusion   . Altered mental status, unspecified 10/20/2019  . Fall 10/11/2019  . ESRD on dialysis [facility]) 08/03/2018    Past Surgical History:  Procedure Laterality Date  . AV FISTULA PLACEMENT Left 08/14/2018   Procedure: INSERTION OF ARTERIOVENOUS GRAFT LEFT ARM;  Surgeon: Serafina Mitchell, MD;  Location: [facility] OR;  Service: Vascular;  Laterality: Left;  . COLONOSCOPY    . TONSILLECTOMY AND ADENOIDECTOMY     at age 40- unsure if adenoids were taken out     OB History   No obstetric history on file.     Family History  Problem Relation Age of Onset  . Heart attack Brother   . Breast cancer Other   . Skin cancer Other   . Colon cancer Neg Hx   . Pancreatic cancer Neg Hx   . Stomach cancer Neg Hx   . Esophageal cancer Neg Hx   . Rectal cancer Neg Hx     Social History   Tobacco Use  . Smoking status: Current Every Day Smoker    Packs/day: 0.25    Types:  Cigarettes  . Smokeless tobacco: Never Used  Substance Use Topics  . Alcohol use: Yes    Alcohol/week: 7.0 standard drinks    Types: 7 Glasses of wine per week    Comment: occasional  . Drug use: No    Comment: marijuana     Home Medications Prior to Admission medications   Medication Sig Start Date End Date Taking? Authorizing Provider  Biotin 1000 MCG CHEW Chew 1,000 mcg by mouth daily.    Yes [provider]  multivitamin (RENA-VIT) TABS tablet Take 1 tablet by mouth daily. 08/03/19  Yes [provider]  atenolol (TENORMIN) 50 MG tablet Take 1 tablet (50 mg total) by mouth daily. Patient not taking: Reported on 12/24/2019 10/22/19   Lattie Haw, MD    Allergies    Patient has no known allergies.  Review of Systems   Review of Systems  Respiratory: Positive for shortness of breath.   All other systems reviewed and are negative.   Physical Exam Updated Vital Signs BP (!) 200/112   Pulse (!) 104   Temp 98.4 F (36.9 C) (Oral)  Resp (!) 22   SpO2 93%   Physical Exam Vitals and nursing note reviewed.  Constitutional:      General: She is in acute distress.     Appearance: She is underweight.  HENT:     Head: Normocephalic and atraumatic.     Right Ear: External ear normal.     Left Ear: External ear normal.     Nose: Nose normal.     Mouth/Throat:     Mouth: Mucous membranes are dry.  Eyes:     Extraocular Movements: Extraocular movements intact.     Conjunctiva/sclera: Conjunctivae normal.     Pupils: Pupils are equal, round, and reactive to light.  Cardiovascular:     Rate and Rhythm: Normal rate and regular rhythm.     Pulses: Normal pulses.     Heart sounds: Normal heart sounds.  Pulmonary:     Effort: Tachypnea present.     Breath sounds: Rhonchi present.  Abdominal:     General: Abdomen is flat. Bowel sounds are normal.     Palpations: Abdomen is soft.  Musculoskeletal:        General: Normal range of motion.     Cervical back:  Normal range of motion and neck supple.  Skin:    General: Skin is warm.     Capillary Refill: Capillary refill takes less than 2 seconds.  Neurological:     General: No focal deficit present.     Mental Status: She is alert and oriented to person, place, and time.  Psychiatric:        Mood and Affect: Mood normal.        Behavior: Behavior normal.        Thought Content: Thought content normal.        Judgment: Judgment normal.     ED Results / Procedures / Treatments   Labs (all labs ordered are listed, but only abnormal results are displayed) Labs Reviewed  BASIC METABOLIC PANEL - Abnormal; Notable for the following components:      Result Value   Potassium 6.5 (*)    CO2 12 (*)    Glucose, Bld 149 (*)    BUN 129 (*)    Creatinine, Ser 10.62 (*)    GFR calc non Af Amer 3 (*)    GFR calc Af Amer 4 (*)    Anion gap 18 (*)    All other components within normal limits  CBC WITH DIFFERENTIAL/PLATELET - Abnormal; Notable for the following components:   RBC 3.41 (*)    Hemoglobin 10.4 (*)    HCT 33.5 (*)    RDW 15.8 (*)    All other components within normal limits  BRAIN NATRIURETIC PEPTIDE - Abnormal; Notable for the following components:   B Natriuretic Peptide >4,500.0 (*)    All other components within normal limits  I-STAT CHEM 8, ED - Abnormal; Notable for the following components:   Potassium 6.5 (*)    Chloride 113 (*)    BUN >130 (*)    Creatinine, Ser 12.10 (*)    Glucose, Bld 141 (*)    Calcium, Ion 1.10 (*)    TCO2 13 (*)    Hemoglobin 10.9 (*)    HCT 32.0 (*)    All other components within normal limits  RESPIRATORY PANEL BY RT PCR (FLU A&B, COVID)    EKG EKG Interpretation  Date/Time:  Friday December 24 2019 08:43:04 EST Ventricular Rate:  106 PR Interval:    QRS  Duration: 88 QT Interval:  339 QTC Calculation: 451 R Axis:   -52 Text Interpretation: Sinus tachycardia Atrial premature complex Probable left atrial enlargement Left anterior  fascicular block Consider anterior infarct Minimal ST depression, lateral leads Since last tracing rate faster Confirmed by Jacalyn Lefevre [phone]) on 12/24/2019 8:46:58 AM   Radiology DG Chest Port 1 View  Result Date: 12/24/2019 CLINICAL DATA:  Shortness of breath.  Chronic renal failure EXAM: PORTABLE CHEST 1 VIEW COMPARISON:  October 20, 2019 FINDINGS: There is cardiomegaly with pulmonary venous hypertension. There is diffuse interstitial edema, most notably in the lung bases. There is airspace opacity in the right base. There is a minimal right pleural effusion. No adenopathy.  There is aortic atherosclerosis.  No bone lesions. IMPRESSION: 1. Cardiomegaly with pulmonary vascular congestion and interstitial edema. Findings consistent with volume overload/congestive heart failure. 2. Airspace opacity right base, likely superimposed pneumonia. Aspiration could present similarly. Alveolar edema is possible in this area as well. 3.  Aortic Atherosclerosis (ICD10-I70.0). Electronically Signed   By: Bretta Bang III M.D.   On: 12/24/2019 09:10    Procedures Procedures (including critical care time)  Medications Ordered in ED Medications  Chlorhexidine Gluconate Cloth 2 % PADS 6 each (has no administration in time range)    ED Course  I have reviewed the triage vital signs and the nursing notes.  Pertinent labs & imaging results that were available during my care of the patient were reviewed by me and considered in my medical decision making (see chart for details).    MDM Rules/Calculators/A&P                      Pt's RA O2 sat 91%.  She was put on 2L oxygen.  Pt d/w Dr. Arlean Hopping who came to see pt.  He arranged for pt to go to dialysis emergently.    Covid swab negative.  Pt did not require bipap or med tx for hyperkalemia as she was able to go to dialysis very quickly.  I would anticipate that she can go home after dialysis as that will treat her current issues.  Brownie Gockel  was evaluated in Emergency Department on 12/24/2019 for the symptoms described in the history of present illness. She was evaluated in the context of the global COVID-19 pandemic, which necessitated consideration that the patient might be at risk for infection with the SARS-CoV-2 virus that causes COVID-19. Institutional protocols and algorithms that pertain to the evaluation of patients at risk for COVID-19 are in a state of rapid change based on information released by regulatory bodies including the CDC and federal and state organizations. These policies and algorithms were followed during the patient's care in the ED.  CRITICAL CARE Performed by: Jacalyn Lefevre   Total critical care time: 30 minutes  Critical care time was exclusive of separately billable procedures and treating other patients.  Critical care was necessary to treat or prevent imminent or life-threatening deterioration.  Critical care was time spent personally by me on the following activities: development of treatment plan with patient and/or surrogate as well as nursing, discussions with consultants, evaluation of patient's response to treatment, examination of patient, obtaining history from patient or surrogate, ordering and performing treatments and interventions, ordering and review of laboratory studies, ordering and review of radiographic studies, pulse oximetry and re-evaluation of patient's condition.  Final Clinical Impression(s) / ED Diagnoses Final diagnoses:  ESRD on hemodialysis (HCC)  Acute pulmonary edema (HCC)  Hyperkalemia  Acute respiratory failure with hypoxia [facility])    Rx / DC Orders ED Discharge Orders    None       Isla Pence, MD 12/24/19 1036

## 2019-12-24 NOTE — ED Triage Notes (Signed)
Pt here from home with c/o sob , pt thinks she may have missed a whole week of dialysis , pt sats 91 % on room air

## 2020-03-09 ENCOUNTER — Emergency Department (HOSPITAL_COMMUNITY)
Admission: EM | Admit: 2020-03-09 | Discharge: 2020-03-09 | Disposition: A | Discharge status: Home / Self Care | Payer: Medicare Other | Source: Home / Self Care | Attending: Emergency Medicine | Admitting: Emergency Medicine

## 2020-03-09 ENCOUNTER — Other Ambulatory Visit: Payer: Self-pay

## 2020-03-09 ENCOUNTER — Emergency Department (HOSPITAL_COMMUNITY): Payer: Medicare Other

## 2020-03-09 ENCOUNTER — Encounter (HOSPITAL_COMMUNITY): Payer: Self-pay | Admitting: Emergency Medicine

## 2020-03-09 DIAGNOSIS — J449 Chronic obstructive pulmonary disease, unspecified: Secondary | ICD-10-CM | POA: Insufficient documentation

## 2020-03-09 DIAGNOSIS — Z8249 Family history of ischemic heart disease and other diseases of the circulatory system: Secondary | ICD-10-CM

## 2020-03-09 DIAGNOSIS — R64 Cachexia: Secondary | ICD-10-CM | POA: Diagnosis present

## 2020-03-09 DIAGNOSIS — I471 Supraventricular tachycardia: Secondary | ICD-10-CM | POA: Diagnosis present

## 2020-03-09 DIAGNOSIS — E8889 Other specified metabolic disorders: Secondary | ICD-10-CM | POA: Diagnosis present

## 2020-03-09 DIAGNOSIS — E785 Hyperlipidemia, unspecified: Secondary | ICD-10-CM | POA: Diagnosis present

## 2020-03-09 DIAGNOSIS — G9341 Metabolic encephalopathy: Principal | ICD-10-CM | POA: Diagnosis present

## 2020-03-09 DIAGNOSIS — F1210 Cannabis abuse, uncomplicated: Secondary | ICD-10-CM | POA: Diagnosis present

## 2020-03-09 DIAGNOSIS — R569 Unspecified convulsions: Secondary | ICD-10-CM | POA: Diagnosis present

## 2020-03-09 DIAGNOSIS — R410 Disorientation, unspecified: Secondary | ICD-10-CM

## 2020-03-09 DIAGNOSIS — R944 Abnormal results of kidney function studies: Secondary | ICD-10-CM | POA: Diagnosis present

## 2020-03-09 DIAGNOSIS — R5381 Other malaise: Secondary | ICD-10-CM | POA: Diagnosis present

## 2020-03-09 DIAGNOSIS — R0602 Shortness of breath: Secondary | ICD-10-CM | POA: Insufficient documentation

## 2020-03-09 DIAGNOSIS — N186 End stage renal disease: Secondary | ICD-10-CM | POA: Diagnosis present

## 2020-03-09 DIAGNOSIS — I120 Hypertensive chronic kidney disease with stage 5 chronic kidney disease or end stage renal disease: Secondary | ICD-10-CM | POA: Insufficient documentation

## 2020-03-09 DIAGNOSIS — E162 Hypoglycemia, unspecified: Secondary | ICD-10-CM | POA: Diagnosis present

## 2020-03-09 DIAGNOSIS — Z79899 Other long term (current) drug therapy: Secondary | ICD-10-CM

## 2020-03-09 DIAGNOSIS — Z20822 Contact with and (suspected) exposure to covid-19: Secondary | ICD-10-CM | POA: Diagnosis present

## 2020-03-09 DIAGNOSIS — Z681 Body mass index (BMI) 19 or less, adult: Secondary | ICD-10-CM

## 2020-03-09 DIAGNOSIS — R55 Syncope and collapse: Secondary | ICD-10-CM | POA: Diagnosis present

## 2020-03-09 DIAGNOSIS — Z9115 Patient's noncompliance with renal dialysis: Secondary | ICD-10-CM

## 2020-03-09 DIAGNOSIS — Z992 Dependence on renal dialysis: Secondary | ICD-10-CM | POA: Insufficient documentation

## 2020-03-09 DIAGNOSIS — F17210 Nicotine dependence, cigarettes, uncomplicated: Secondary | ICD-10-CM | POA: Insufficient documentation

## 2020-03-09 DIAGNOSIS — F1410 Cocaine abuse, uncomplicated: Secondary | ICD-10-CM | POA: Diagnosis present

## 2020-03-09 DIAGNOSIS — D631 Anemia in chronic kidney disease: Secondary | ICD-10-CM | POA: Diagnosis present

## 2020-03-09 DIAGNOSIS — I161 Hypertensive emergency: Secondary | ICD-10-CM | POA: Diagnosis present

## 2020-03-09 DIAGNOSIS — E872 Acidosis: Secondary | ICD-10-CM | POA: Diagnosis present

## 2020-03-09 LAB — COMPREHENSIVE METABOLIC PANEL
ALT: 16 U/L (ref 0–44)
AST: 39 U/L (ref 15–41)
Albumin: 3.6 g/dL (ref 3.5–5.0)
Alkaline Phosphatase: 73 U/L (ref 38–126)
Anion gap: 25 — ABNORMAL HIGH (ref 5–15)
BUN: 45 mg/dL — ABNORMAL HIGH (ref 8–23)
CO2: 18 mmol/L — ABNORMAL LOW (ref 22–32)
Calcium: 8.5 mg/dL — ABNORMAL LOW (ref 8.9–10.3)
Chloride: 95 mmol/L — ABNORMAL LOW (ref 98–111)
Creatinine, Ser: 9.83 mg/dL — ABNORMAL HIGH (ref 0.44–1.00)
GFR calc Af Amer: 4 mL/min — ABNORMAL LOW (ref >60)
GFR calc non Af Amer: 4 mL/min — ABNORMAL LOW (ref >60)
Glucose, Bld: 68 mg/dL — ABNORMAL LOW (ref 70–99)
Potassium: 5.1 mmol/L (ref 3.5–5.1)
Sodium: 138 mmol/L (ref 135–145)
Total Bilirubin: 1.4 mg/dL — ABNORMAL HIGH (ref 0.3–1.2)
Total Protein: 7.1 g/dL (ref 6.5–8.1)

## 2020-03-09 LAB — CBC WITH DIFFERENTIAL/PLATELET
Abs Immature Granulocytes: 0.03 10*3/uL (ref 0.00–0.07)
Basophils Absolute: 0 10*3/uL (ref 0.0–0.1)
Basophils Relative: 0 %
Eosinophils Absolute: 0 10*3/uL (ref 0.0–0.5)
Eosinophils Relative: 0 %
HCT: 44 % (ref 36.0–46.0)
Hemoglobin: 13.3 g/dL (ref 12.0–15.0)
Immature Granulocytes: 0 %
Lymphocytes Relative: 11 %
Lymphs Abs: 0.7 10*3/uL (ref 0.7–4.0)
MCH: 27 pg (ref 26.0–34.0)
MCHC: 30.2 g/dL (ref 30.0–36.0)
MCV: 89.2 fL (ref 80.0–100.0)
Monocytes Absolute: 0.6 10*3/uL (ref 0.1–1.0)
Monocytes Relative: 9 %
Neutro Abs: 5.3 10*3/uL (ref 1.7–7.7)
Neutrophils Relative %: 80 %
Platelets: 229 10*3/uL (ref 150–400)
RBC: 4.93 MIL/uL (ref 3.87–5.11)
RDW: 18.7 % — ABNORMAL HIGH (ref 11.5–15.5)
WBC: 6.7 10*3/uL (ref 4.0–10.5)
nRBC: 0 % (ref 0.0–0.2)

## 2020-03-09 LAB — PROTIME-INR
INR: 1 (ref 0.8–1.2)
Prothrombin Time: 12.3 seconds (ref 11.4–15.2)

## 2020-03-09 LAB — ETHANOL: Alcohol, Ethyl (B): <10 mg/dL (ref <10)

## 2020-03-09 LAB — CBG MONITORING, ED
Glucose-Capillary: 66 mg/dL — ABNORMAL LOW (ref 70–99)
Glucose-Capillary: 79 mg/dL (ref 70–99)
Glucose-Capillary: 80 mg/dL (ref 70–99)

## 2020-03-09 LAB — BRAIN NATRIURETIC PEPTIDE: B Natriuretic Peptide: >4500 pg/mL — ABNORMAL HIGH (ref 0.0–100.0)

## 2020-03-09 LAB — LACTIC ACID, PLASMA: Lactic Acid, Venous: 3 mmol/L (ref 0.5–1.9)

## 2020-03-09 IMAGING — CT CT MAXILLOFACIAL W/O CM
3 of 8 series · 14 of 47 positions shown, 16 images · non-contrast
Comparison: MRI [DATE], CT [DATE]

CLINICAL DATA: Altered mental status, facial trauma

EXAM:
CT HEAD WITHOUT CONTRAST
CT MAXILLOFACIAL WITHOUT CONTRAST
TECHNIQUE: Multidetector CT imaging of the head and maxillofacial structures
were performed using the standard protocol without intravenous
contrast. Multiplanar CT image reconstructions of the maxillofacial
structures were also generated.

[Series 4: st thins · axial · 0.33mm/px · z∈[+1179,+1322]mm · 8 of 256 slices shown, 10 images]
[im 26/256  brain]
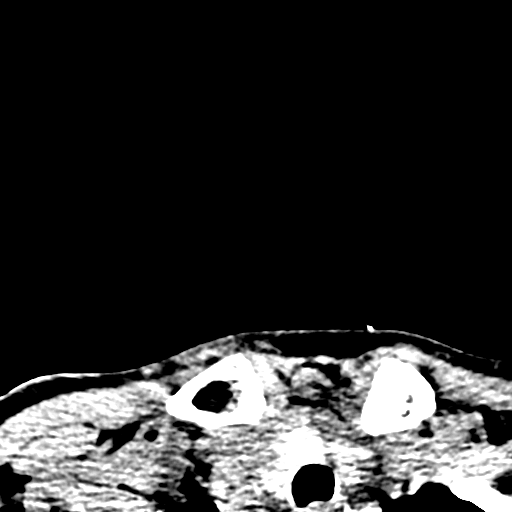
[im 26/256  bone]
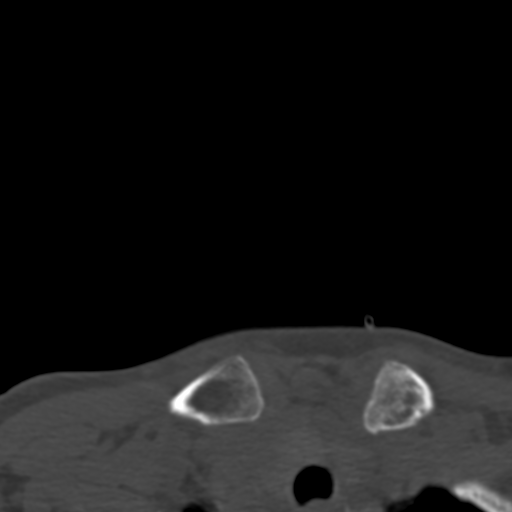
[im 52/256  bone]
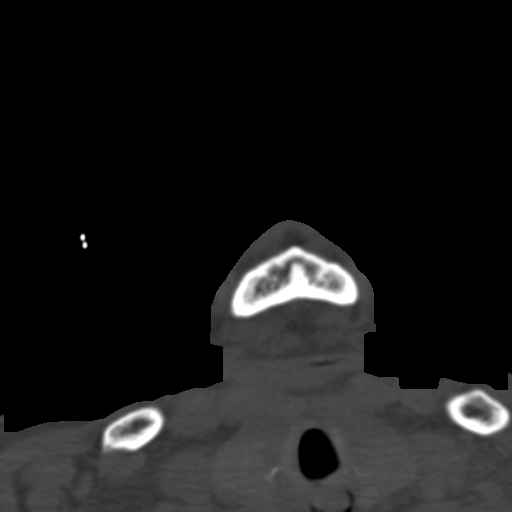
[im 77/256  bone]
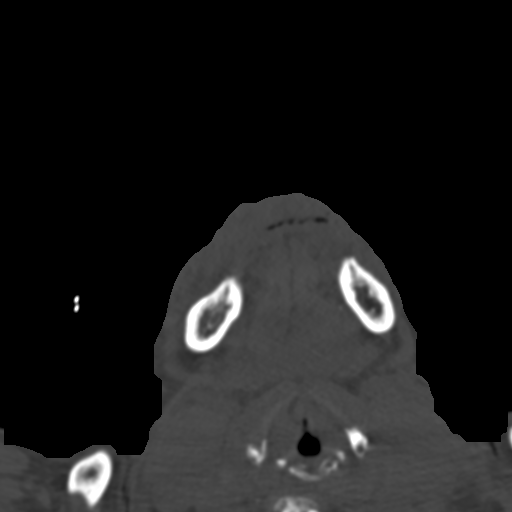
[im 103/256  bone]
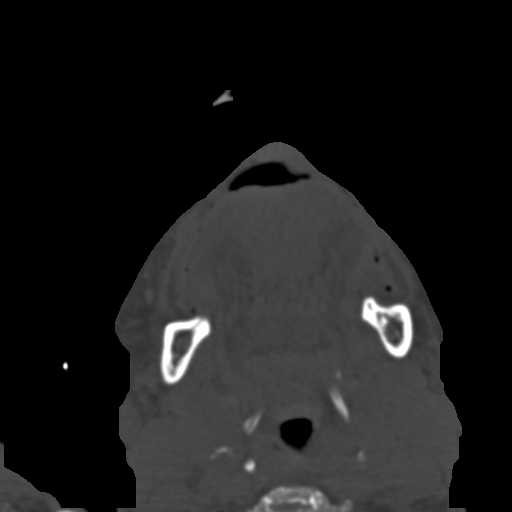
[im 154/256  brain]
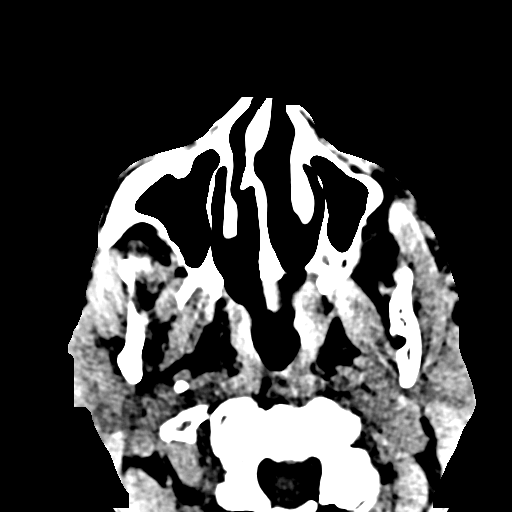
[im 154/256  bone]
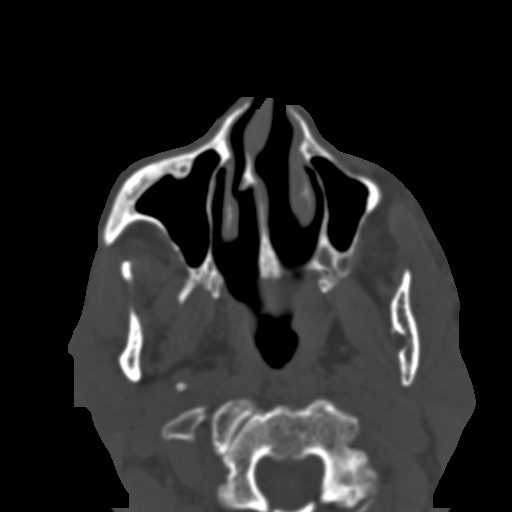
[im 179/256  bone]
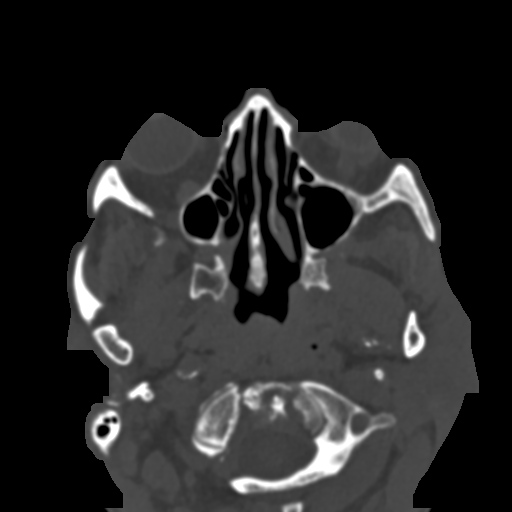
[im 205/256  bone]
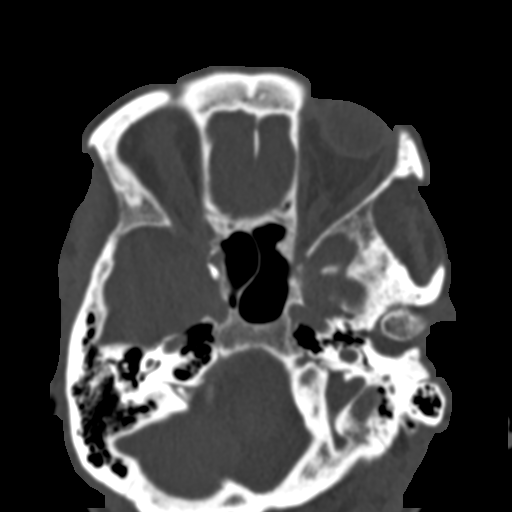
[im 230/256  bone]
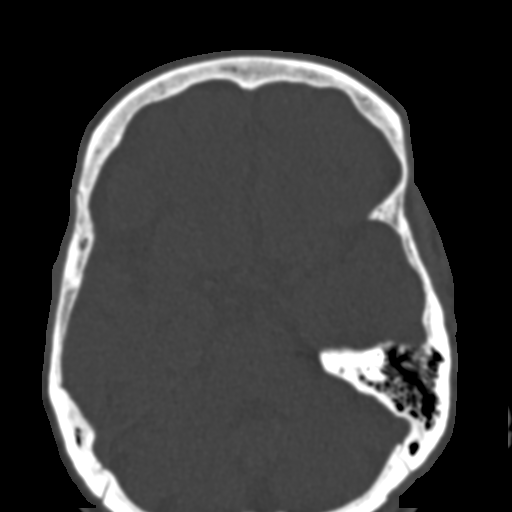

[Series 7: st cor · coronal · 0.45mm/px · 3 of 134 slices shown]
[im 34/134  bone]
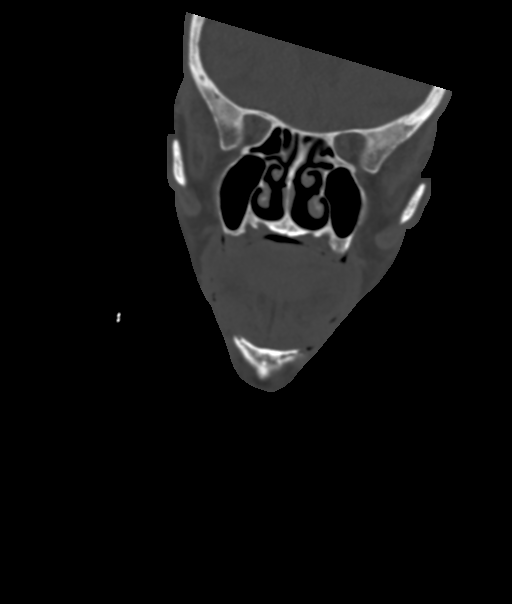
[im 67/134  bone]
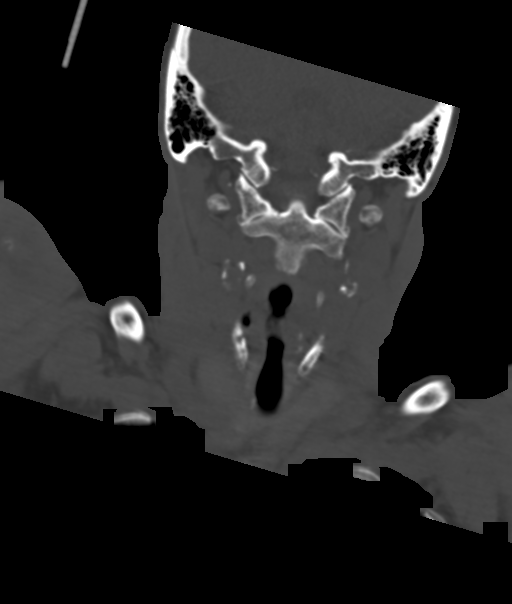
[im 100/134  bone]
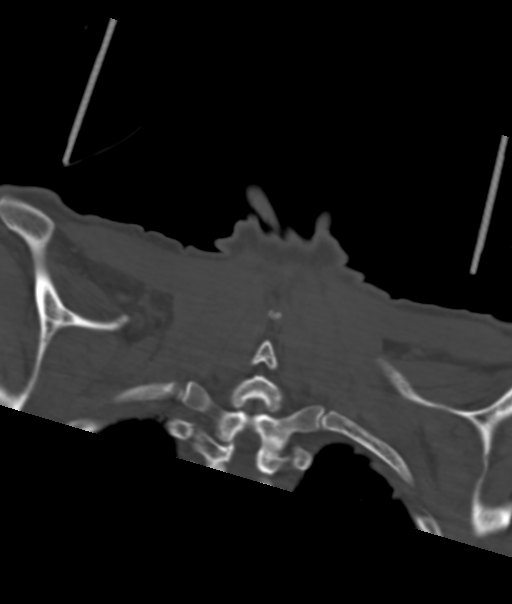

[Series 10: bone sag · sagittal · 0.53mm/px · 3 of 80 slices shown]
[im 49/80  bone]
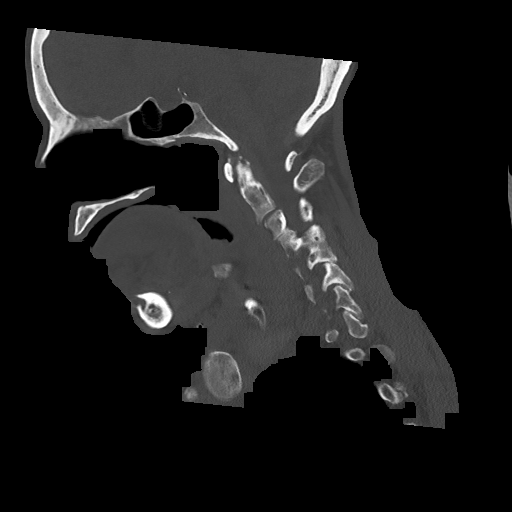
[im 59/80  bone]
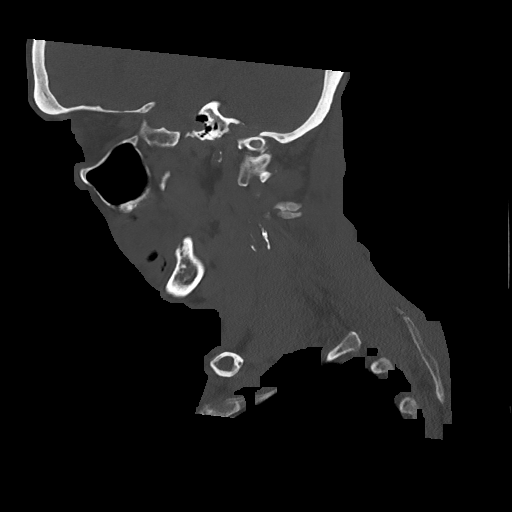
[im 69/80  bone]
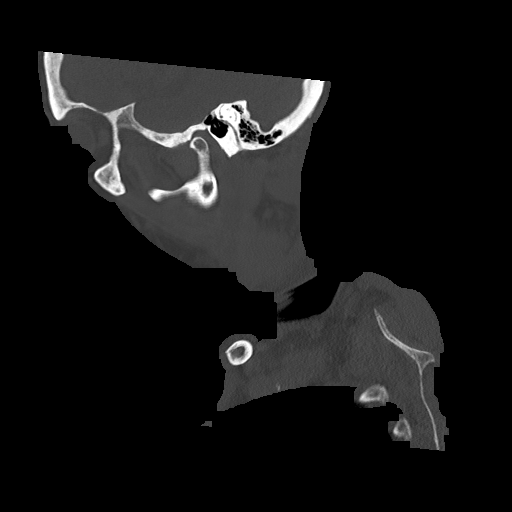

[14 of 47 positions shown; findings below may reference images not displayed]

FINDINGS: CT HEAD FINDINGS

Brain: No evidence of acute infarction, hemorrhage, hydrocephalus,
extra-axial collection or mass lesion/mass effect. Symmetric
prominence of the ventricles, cisterns and sulci compatible with
parenchymal volume loss. Patchy areas of white matter
hypoattenuation are most compatible with chronic microvascular
angiopathy.

Vascular: Atherosclerotic calcification of the carotid siphons and
intradural vertebral arteries. No hyperdense vessel.

Skull: No calvarial fracture or suspicious osseous lesion. No scalp
swelling or hematoma.

Other: None

CT MAXILLOFACIAL FINDINGS

Osseous: No fracture of the bony orbits. Mild bilateral deformity of
the nasal bones without overlying swelling favoring remote finding.
No other mid face fractures are seen. The pterygoid plates are
intact. The mandible is intact. There is bilateral temporomandibular
joint arthrosis, severe on the right and more mild on the left. No
temporal bone fractures are identified. Patient is edentulous.

Orbits: No retro septal gas, stranding or hemorrhage. The globes
appear normal and symmetric. Symmetric appearance of the extraocular
musculature and optic nerve sheath complexes. Normal caliber of the
superior ophthalmic veins.

Sinuses: Paranasal sinuses are predominantly clear. Slight rightward
nasal septal deviation with a right-sided nasal septal spur. Mastoid
air cells are well aerated. Middle ear cavities are clear. Ossicular
chains appear normally configured. Pneumatization of the petrous
apices.

Soft tissues: Mild left supraorbital soft tissue thickening. No
large hematoma, soft tissue gas or foreign body. Cervical carotid
and vertebral artery atherosclerosis.

Limited cervical: Craniocervical atlantoaxial articulations are
normally aligned. Reversal the normal cervical lordosis. No
traumatic listhesis. No visible cervical spine fracture. Multilevel
cervical spondylitic changes are seen throughout the included
portions of the cervical spine most pronounced at the C5-C7 levels.
Bony fusion of the C4-5 articular facets is noted. No abnormal facet
widening, perched or jumped facets. Small degenerative os
odontoideum.
IMPRESSION: 1. No acute intracranial abnormality.
2. Mild to moderate parenchymal volume loss and chronic
microvascular angiopathy changes.
3. Mild left supraorbital soft tissue thickening without large
hematoma, soft tissue gas or foreign body.
4. Mild bilateral deformity of the nasal bones without overlying
swelling favoring remote finding. Correlate for point tenderness.
5. Multilevel cervical spondylitic changes most pronounced at the
C5-C7 levels or sclerosis is favored to be degenerative in the
absence of infectious symptoms.
6. Cervical and vertebral artery atherosclerosis.
7. Bilateral temporomandibular joint arthrosis, severe on the right
and more mild on the left.

## 2020-03-09 IMAGING — CT CT HEAD W/O CM
4 series · 15 of 47 positions shown, 17 images · non-contrast
Comparison: MRI [DATE], CT [DATE]

CLINICAL DATA: Altered mental status, facial trauma

EXAM:
CT HEAD WITHOUT CONTRAST
CT MAXILLOFACIAL WITHOUT CONTRAST
TECHNIQUE: Multidetector CT imaging of the head and maxillofacial structures
were performed using the standard protocol without intravenous
contrast. Multiplanar CT image reconstructions of the maxillofacial
structures were also generated.

[Series 3: head wo · axial · 0.41mm/px · z∈[+1290,+1410]mm · 7 of 32 slices shown, 9 images]
[im 4/32  brain]
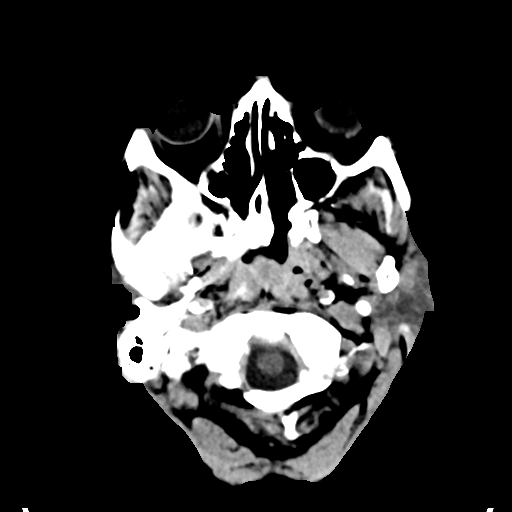
[im 4/32  bone]
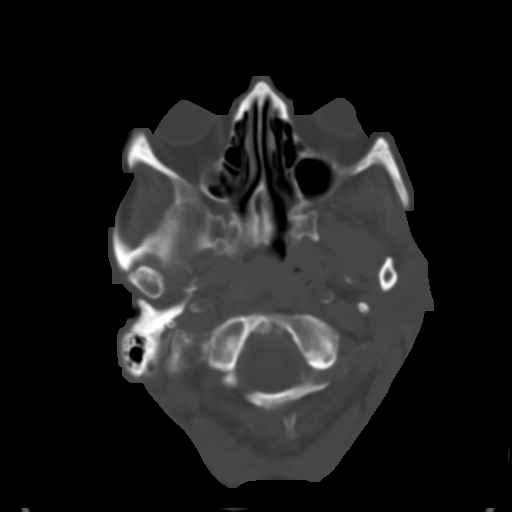
[im 8/32  brain]
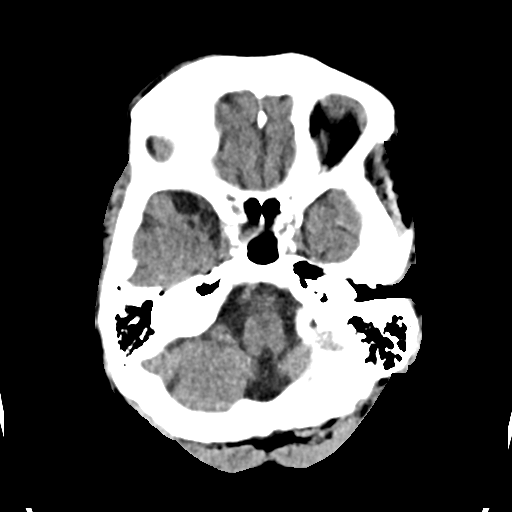
[im 12/32  brain]
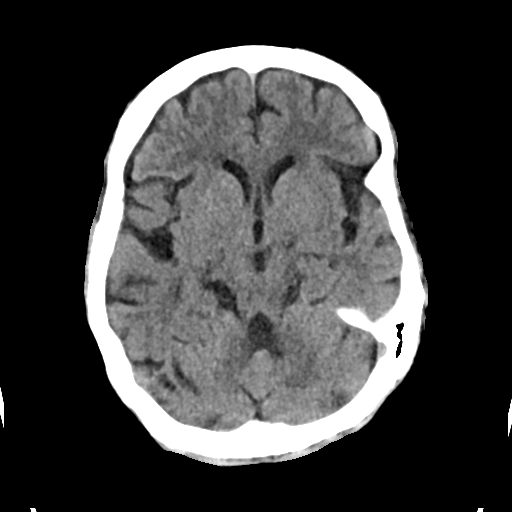
[im 16/32  brain]
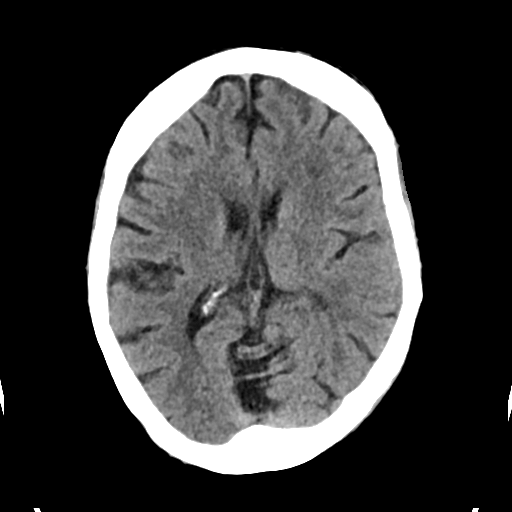
[im 20/32  brain]
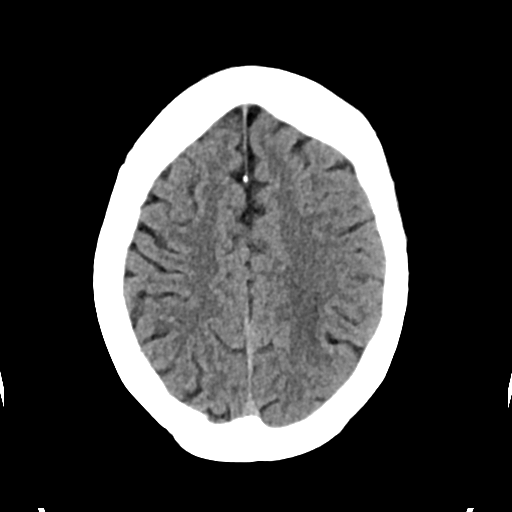
[im 20/32  bone]
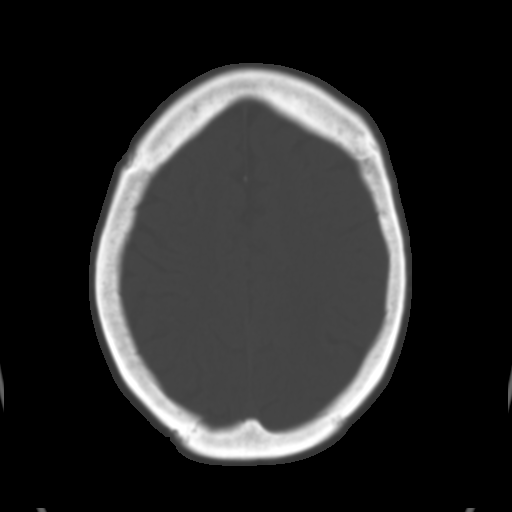
[im 24/32  brain]
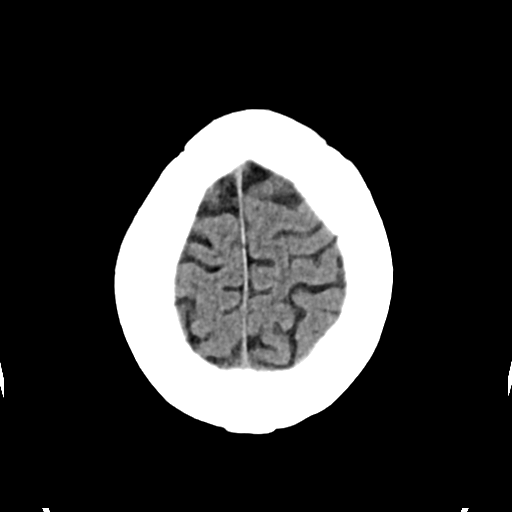
[im 28/32  brain]
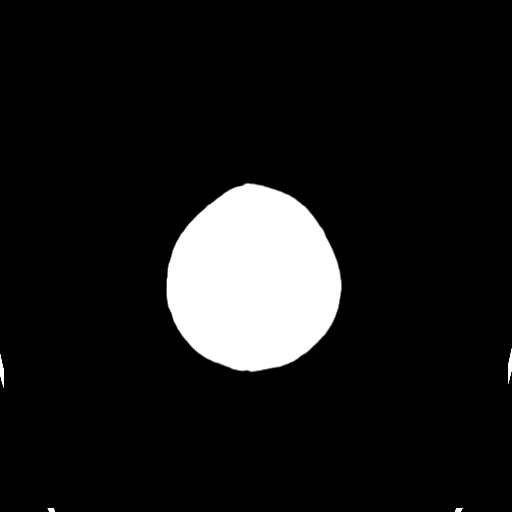

[Series 4: head bone · axial · 0.41mm/px · z∈[+1288,+1304]mm · 2 of 80 slices shown]
[im 8/80  bone]
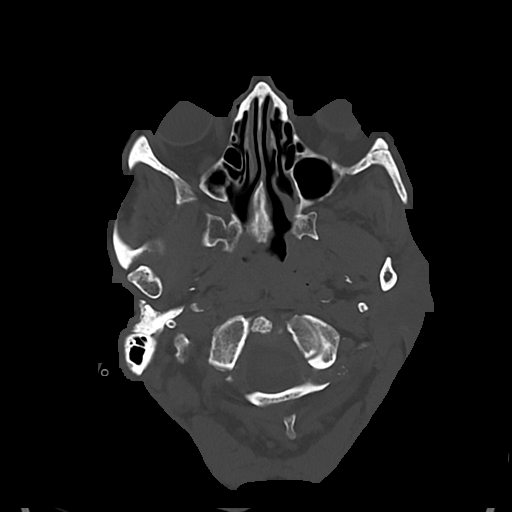
[im 16/80  bone]
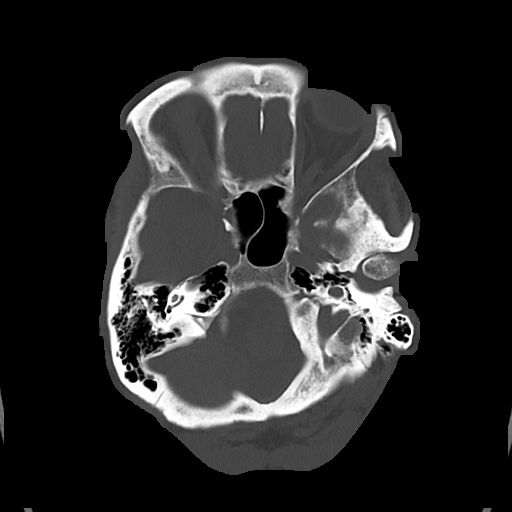

[Series 5: cor soft · coronal · 0.36mm/px · 3 of 77 slices shown]
[im 26/77  brain]
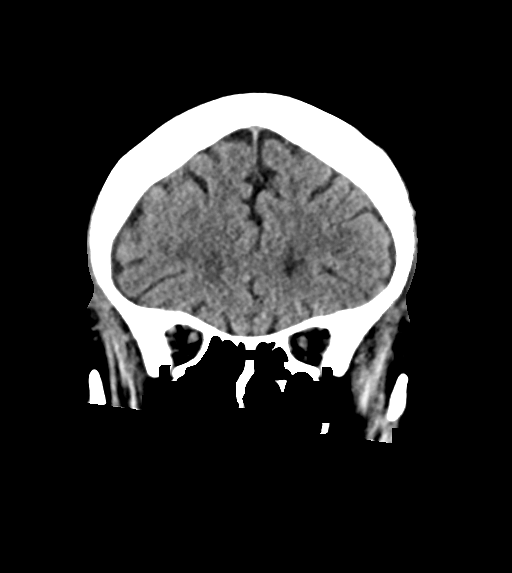
[im 34/77  brain]
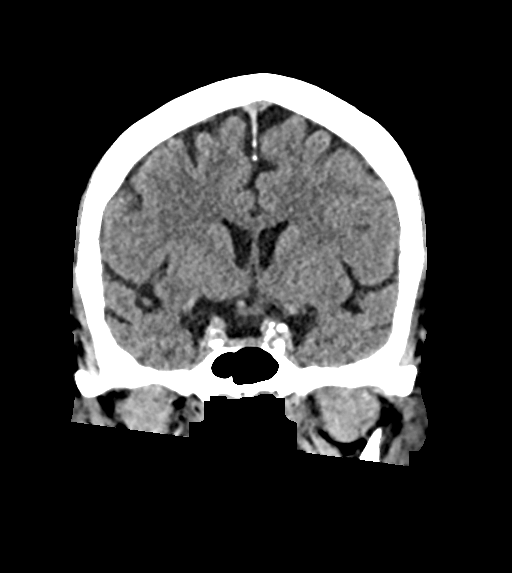
[im 43/77  brain]
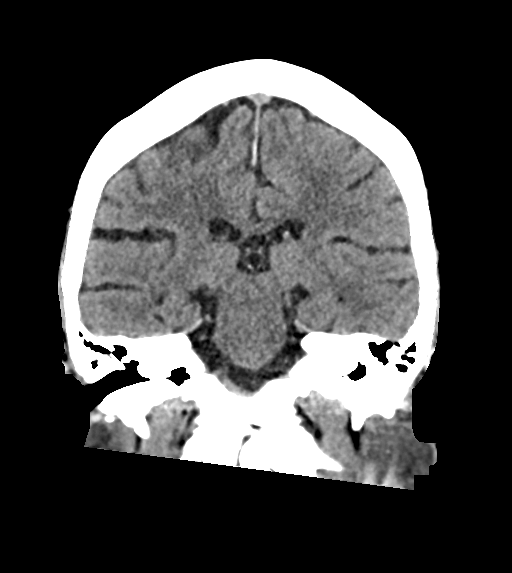

[Series 6: sag soft · sagittal · 0.43mm/px · 3 of 55 slices shown]
[im 19/55  brain]
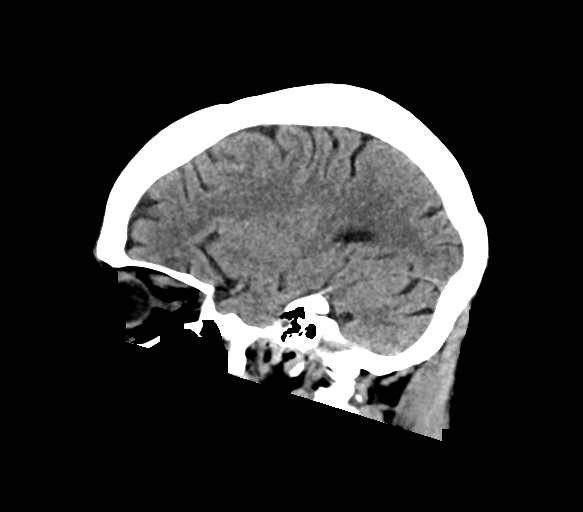
[im 28/55  brain]
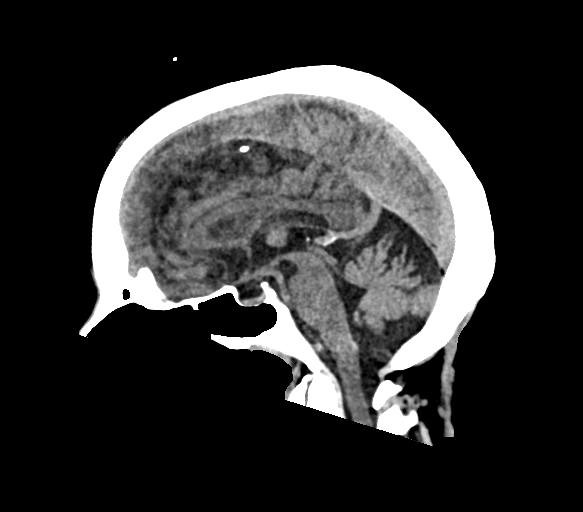
[im 36/55  brain]
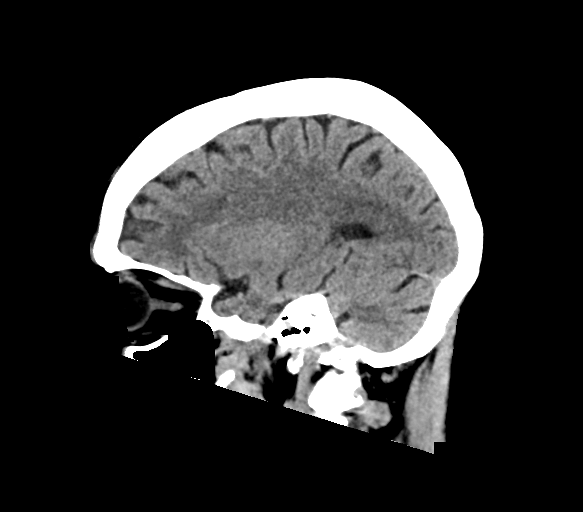

[15 of 47 positions shown; findings below may reference images not displayed]

FINDINGS: CT HEAD FINDINGS

Brain: No evidence of acute infarction, hemorrhage, hydrocephalus,
extra-axial collection or mass lesion/mass effect. Symmetric
prominence of the ventricles, cisterns and sulci compatible with
parenchymal volume loss. Patchy areas of white matter
hypoattenuation are most compatible with chronic microvascular
angiopathy.

Vascular: Atherosclerotic calcification of the carotid siphons and
intradural vertebral arteries. No hyperdense vessel.

Skull: No calvarial fracture or suspicious osseous lesion. No scalp
swelling or hematoma.

Other: None

CT MAXILLOFACIAL FINDINGS

Osseous: No fracture of the bony orbits. Mild bilateral deformity of
the nasal bones without overlying swelling favoring remote finding.
No other mid face fractures are seen. The pterygoid plates are
intact. The mandible is intact. There is bilateral temporomandibular
joint arthrosis, severe on the right and more mild on the left. No
temporal bone fractures are identified. Patient is edentulous.

Orbits: No retro septal gas, stranding or hemorrhage. The globes
appear normal and symmetric. Symmetric appearance of the extraocular
musculature and optic nerve sheath complexes. Normal caliber of the
superior ophthalmic veins.

Sinuses: Paranasal sinuses are predominantly clear. Slight rightward
nasal septal deviation with a right-sided nasal septal spur. Mastoid
air cells are well aerated. Middle ear cavities are clear. Ossicular
chains appear normally configured. Pneumatization of the petrous
apices.

Soft tissues: Mild left supraorbital soft tissue thickening. No
large hematoma, soft tissue gas or foreign body. Cervical carotid
and vertebral artery atherosclerosis.

Limited cervical: Craniocervical atlantoaxial articulations are
normally aligned. Reversal the normal cervical lordosis. No
traumatic listhesis. No visible cervical spine fracture. Multilevel
cervical spondylitic changes are seen throughout the included
portions of the cervical spine most pronounced at the C5-C7 levels.
Bony fusion of the C4-5 articular facets is noted. No abnormal facet
widening, perched or jumped facets. Small degenerative os
odontoideum.
IMPRESSION: 1. No acute intracranial abnormality.
2. Mild to moderate parenchymal volume loss and chronic
microvascular angiopathy changes.
3. Mild left supraorbital soft tissue thickening without large
hematoma, soft tissue gas or foreign body.
4. Mild bilateral deformity of the nasal bones without overlying
swelling favoring remote finding. Correlate for point tenderness.
5. Multilevel cervical spondylitic changes most pronounced at the
C5-C7 levels or sclerosis is favored to be degenerative in the
absence of infectious symptoms.
6. Cervical and vertebral artery atherosclerosis.
7. Bilateral temporomandibular joint arthrosis, severe on the right
and more mild on the left.

## 2020-03-09 IMAGING — DX DG CHEST 1V PORT
1 series · 1 of 1 positions shown · non-contrast
Comparison: [DATE]

CLINICAL DATA: Altered mental status with chest pain, initial
encounter

EXAM:
PORTABLE CHEST 1 VIEW

[chest]
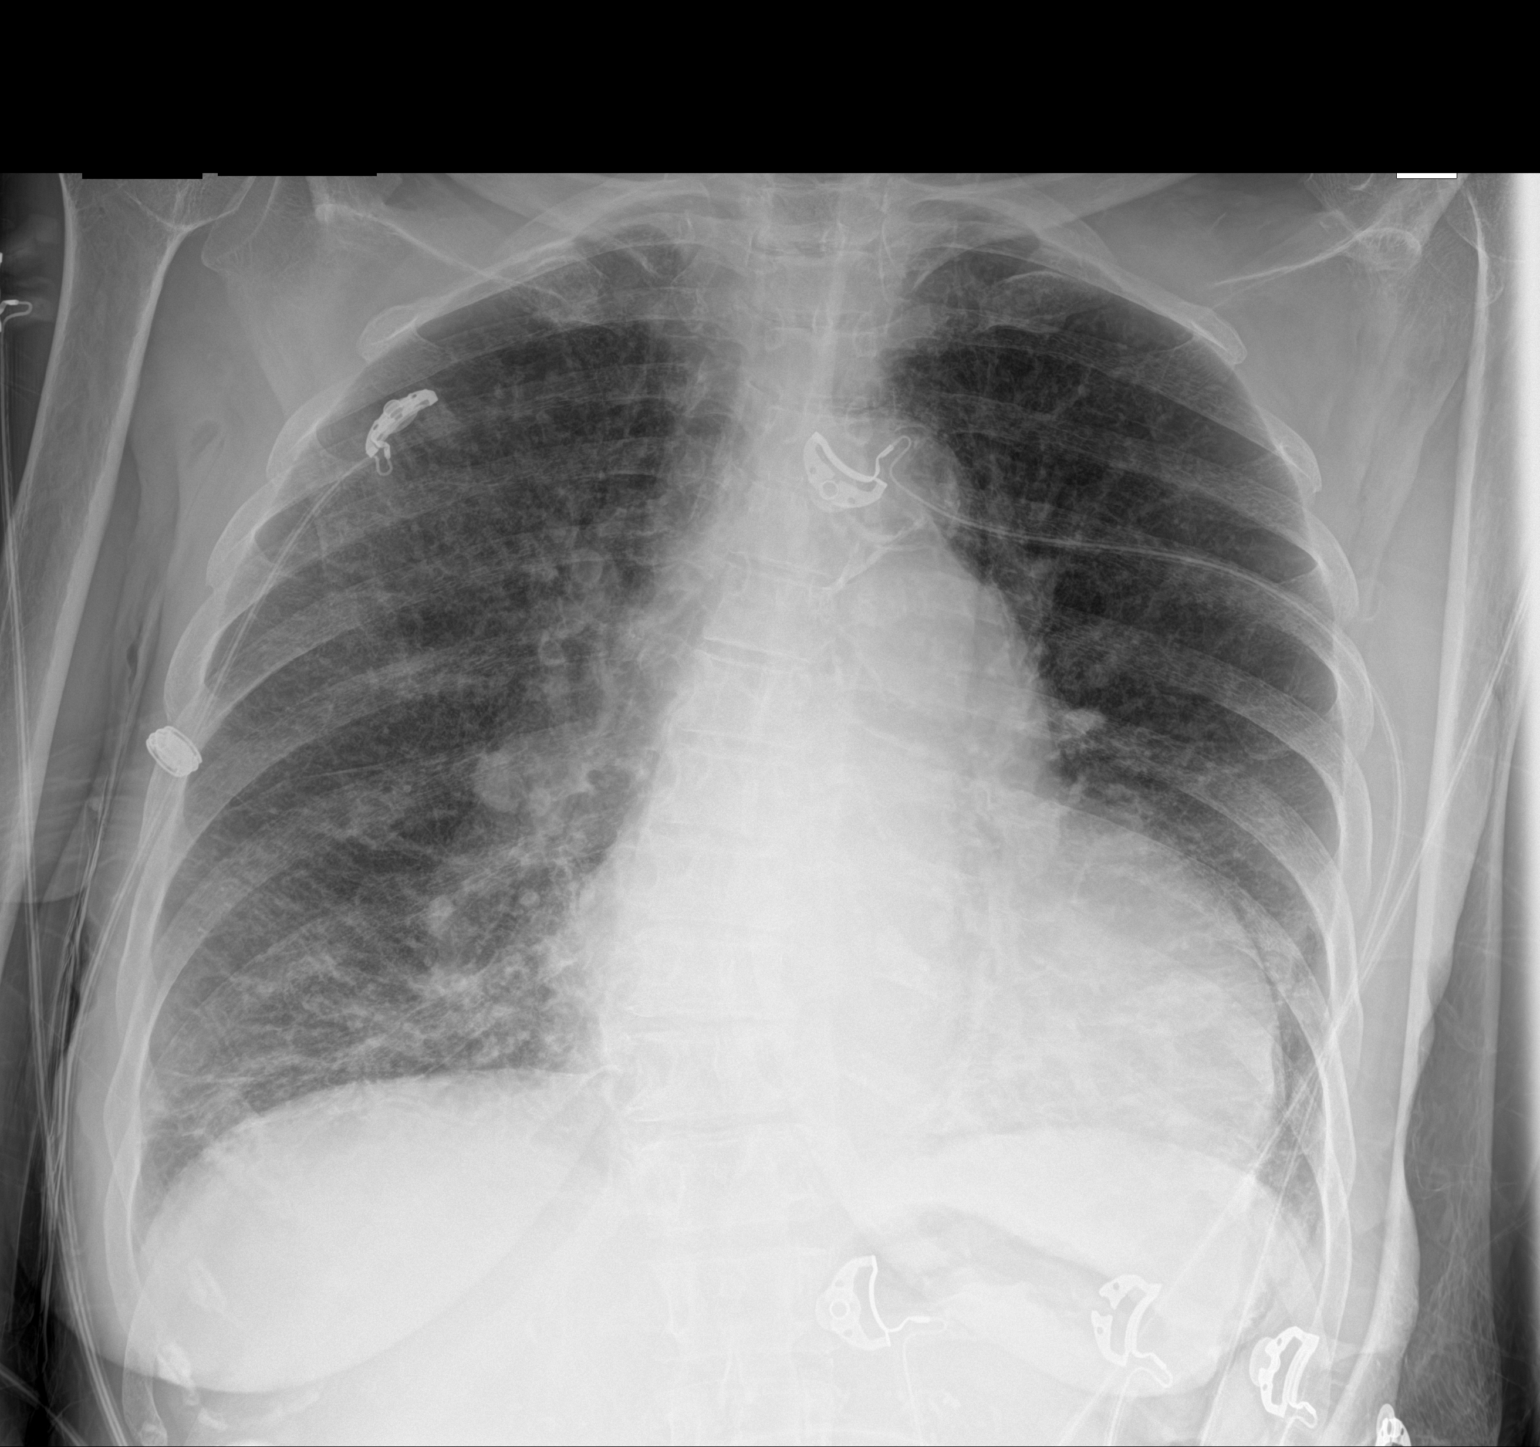

[1 of 1 positions shown; findings below may reference images not displayed]

FINDINGS: Cardiac shadow is enlarged but stable. Aortic calcifications are
again seen. Chronic interstitial changes are noted improved from the
prior exam and likely representing an underlying chronic fibrotic
component. No definitive edema is seen. No focal infiltrate is
noted. No acute bony abnormality is seen.
IMPRESSION: Chronic appearing interstitial changes without superimposed acute
abnormality.

## 2020-03-09 MED ORDER — DEXTROSE 50 % IV SOLN: 1.0000 | Freq: Once | INTRAVENOUS | Status: DC

## 2020-03-09 NOTE — ED Notes (Signed)
[name] and Mardene Celeste, sisters would like an update [phone]

## 2020-03-09 NOTE — Discharge Instructions (Addendum)
As discussed, it is important that he go to dialysis tomorrow and schedule a follow-up visit with your primary care physician.  Although your condition has improved while in the emergency department, should your condition deteriorate, or you develop new, or concerning changes, please not hesitate to return here immediately.

## 2020-03-09 NOTE — ED Notes (Signed)
This RN reached out to pt sister to arrange transport. Pt sister states she will try and get someone to pick up pt. Instructed sister to give call back with final decision. Soil scientist. Pt has all belongings, in stable condition at this time.

## 2020-03-09 NOTE — ED Notes (Signed)
Pts sister called and stated she's trying to get in touch with her son to get her car keys to pick up pt. Sister stated it might be awhile before she comes and picks pt up.

## 2020-03-09 NOTE — Progress Notes (Signed)
VAST consulted to obtain IV access in this renal patient. LA restricted d/t AVF. Lower right arm with few small superficial vessels; attempt to access with 24g angiocath unsuccessful. Ultrasound used to assess vasculature in right arm. Right lower arm without any appropriate vessels noted. Right upper arm with 2 moderate sized brachial veins and one moderate sized basilic vein. Due to renal status, midline and PICC inappropriate without nephrologist approval. ER MD approved USGIV in brachial vein. See flowsheet. Suggested if patient is admitted or needs treatment for extended period, central line will be needed d/t extremely limited venous access.
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
## 2020-03-09 NOTE — ED Notes (Signed)
Pt sister arrives to pick up pt. Family requesting dialysis and brain MRI to see why pt has dementia. Educated family member on use for emergent dialysis in ER. Pt didn't meet criteria for emergent dialysis on this ER visit. Explained to sister pt should schedule dialysis tomorrow. Educated pt on MRI usage in ER. Verbalized understanding. Pt taken via wheelchair in stable condition with family member. All belongings given to sister.

## 2020-03-09 NOTE — ED Triage Notes (Signed)
Pt BIB [facility] from home. Initially called out for shortness of breath. Pt altered upon EMS arrival. Pt A&Ox4 upon arrival to department. Per family pt has been havin gintermittent confusion x4 days. BP high with EMS (222/135). Pt is dialysis pt, missed last treatment Tuesday.

## 2020-03-10 ENCOUNTER — Observation Stay (HOSPITAL_COMMUNITY): Payer: Medicare Other

## 2020-03-10 ENCOUNTER — Encounter (HOSPITAL_COMMUNITY): Payer: Self-pay | Admitting: Internal Medicine

## 2020-03-10 ENCOUNTER — Inpatient Hospital Stay (HOSPITAL_COMMUNITY)
Admission: EM | Admit: 2020-03-10 | Discharge: 2020-03-13 | DRG: 070 | Disposition: A | Discharge status: Home / Self Care | Payer: Medicare Other | Attending: Internal Medicine | Admitting: Internal Medicine

## 2020-03-10 ENCOUNTER — Observation Stay (HOSPITAL_COMMUNITY)
Admit: 2020-03-10 | Discharge: 2020-03-10 | Disposition: A | Discharge status: Home / Self Care | Payer: Medicare Other | Attending: Internal Medicine | Admitting: Internal Medicine

## 2020-03-10 DIAGNOSIS — N186 End stage renal disease: Secondary | ICD-10-CM | POA: Diagnosis not present

## 2020-03-10 DIAGNOSIS — I161 Hypertensive emergency: Secondary | ICD-10-CM

## 2020-03-10 DIAGNOSIS — F1410 Cocaine abuse, uncomplicated: Secondary | ICD-10-CM | POA: Diagnosis not present

## 2020-03-10 DIAGNOSIS — R5381 Other malaise: Secondary | ICD-10-CM | POA: Diagnosis not present

## 2020-03-10 DIAGNOSIS — Z79899 Other long term (current) drug therapy: Secondary | ICD-10-CM | POA: Diagnosis not present

## 2020-03-10 DIAGNOSIS — Z20822 Contact with and (suspected) exposure to covid-19: Secondary | ICD-10-CM | POA: Diagnosis not present

## 2020-03-10 DIAGNOSIS — I471 Supraventricular tachycardia: Secondary | ICD-10-CM | POA: Diagnosis not present

## 2020-03-10 DIAGNOSIS — Z681 Body mass index (BMI) 19 or less, adult: Secondary | ICD-10-CM | POA: Diagnosis not present

## 2020-03-10 DIAGNOSIS — R7989 Other specified abnormal findings of blood chemistry: Secondary | ICD-10-CM | POA: Diagnosis present

## 2020-03-10 DIAGNOSIS — J449 Chronic obstructive pulmonary disease, unspecified: Secondary | ICD-10-CM | POA: Diagnosis not present

## 2020-03-10 DIAGNOSIS — I10 Essential (primary) hypertension: Secondary | ICD-10-CM | POA: Diagnosis present

## 2020-03-10 DIAGNOSIS — R944 Abnormal results of kidney function studies: Secondary | ICD-10-CM | POA: Diagnosis not present

## 2020-03-10 DIAGNOSIS — R569 Unspecified convulsions: Secondary | ICD-10-CM | POA: Diagnosis not present

## 2020-03-10 DIAGNOSIS — Z9115 Patient's noncompliance with renal dialysis: Secondary | ICD-10-CM | POA: Diagnosis not present

## 2020-03-10 DIAGNOSIS — F1910 Other psychoactive substance abuse, uncomplicated: Secondary | ICD-10-CM

## 2020-03-10 DIAGNOSIS — R410 Disorientation, unspecified: Secondary | ICD-10-CM | POA: Diagnosis not present

## 2020-03-10 DIAGNOSIS — F17200 Nicotine dependence, unspecified, uncomplicated: Secondary | ICD-10-CM | POA: Diagnosis present

## 2020-03-10 DIAGNOSIS — E872 Acidosis, unspecified: Secondary | ICD-10-CM | POA: Diagnosis present

## 2020-03-10 DIAGNOSIS — G9341 Metabolic encephalopathy: Secondary | ICD-10-CM | POA: Diagnosis present

## 2020-03-10 DIAGNOSIS — F1210 Cannabis abuse, uncomplicated: Secondary | ICD-10-CM | POA: Diagnosis not present

## 2020-03-10 DIAGNOSIS — F17210 Nicotine dependence, cigarettes, uncomplicated: Secondary | ICD-10-CM | POA: Diagnosis not present

## 2020-03-10 DIAGNOSIS — E8889 Other specified metabolic disorders: Secondary | ICD-10-CM | POA: Diagnosis not present

## 2020-03-10 DIAGNOSIS — R64 Cachexia: Secondary | ICD-10-CM | POA: Diagnosis not present

## 2020-03-10 DIAGNOSIS — Z8249 Family history of ischemic heart disease and other diseases of the circulatory system: Secondary | ICD-10-CM | POA: Diagnosis not present

## 2020-03-10 DIAGNOSIS — E162 Hypoglycemia, unspecified: Secondary | ICD-10-CM | POA: Diagnosis not present

## 2020-03-10 DIAGNOSIS — Z992 Dependence on renal dialysis: Secondary | ICD-10-CM | POA: Diagnosis not present

## 2020-03-10 DIAGNOSIS — E785 Hyperlipidemia, unspecified: Secondary | ICD-10-CM | POA: Diagnosis not present

## 2020-03-10 DIAGNOSIS — D631 Anemia in chronic kidney disease: Secondary | ICD-10-CM | POA: Diagnosis not present

## 2020-03-10 DIAGNOSIS — I120 Hypertensive chronic kidney disease with stage 5 chronic kidney disease or end stage renal disease: Secondary | ICD-10-CM | POA: Diagnosis not present

## 2020-03-10 LAB — CBC
HCT: 41 % (ref 36.0–46.0)
Hemoglobin: 12.9 g/dL (ref 12.0–15.0)
MCH: 27.6 pg (ref 26.0–34.0)
MCHC: 31.5 g/dL (ref 30.0–36.0)
MCV: 87.6 fL (ref 80.0–100.0)
Platelets: 293 10*3/uL (ref 150–400)
RBC: 4.68 MIL/uL (ref 3.87–5.11)
RDW: 18.9 % — ABNORMAL HIGH (ref 11.5–15.5)
WBC: 6.3 10*3/uL (ref 4.0–10.5)
nRBC: 0 % (ref 0.0–0.2)

## 2020-03-10 LAB — LACTIC ACID, PLASMA: Lactic Acid, Venous: 0.9 mmol/L (ref 0.5–1.9)

## 2020-03-10 LAB — CREATININE, SERUM
Creatinine, Ser: 10.27 mg/dL — ABNORMAL HIGH (ref 0.44–1.00)
GFR calc Af Amer: 4 mL/min — ABNORMAL LOW (ref >60)
GFR calc non Af Amer: 3 mL/min — ABNORMAL LOW (ref >60)

## 2020-03-10 LAB — RAPID URINE DRUG SCREEN, HOSP PERFORMED
Amphetamines: NOT DETECTED
Barbiturates: NOT DETECTED
Benzodiazepines: NOT DETECTED
Cocaine: POSITIVE — AB
Opiates: NOT DETECTED
Tetrahydrocannabinol: NOT DETECTED

## 2020-03-10 LAB — SARS CORONAVIRUS 2 BY RT PCR (HOSPITAL ORDER, PERFORMED IN ~~LOC~~ HOSPITAL LAB): SARS Coronavirus 2: NEGATIVE

## 2020-03-10 LAB — GLUCOSE, CAPILLARY
Glucose-Capillary: 69 mg/dL — ABNORMAL LOW (ref 70–99)
Glucose-Capillary: 101 mg/dL — ABNORMAL HIGH (ref 70–99)
Glucose-Capillary: 109 mg/dL — ABNORMAL HIGH (ref 70–99)

## 2020-03-10 LAB — ECHOCARDIOGRAM COMPLETE

## 2020-03-10 LAB — MRSA PCR SCREENING: MRSA by PCR: NEGATIVE

## 2020-03-10 IMAGING — MR MR HEAD W/O CM
10 of 11 series · 39 of 48 positions shown · non-contrast
Comparison: Head and face CT yesterday. Brain MRI [DATE].

CLINICAL DATA: 74-year-old female with altered mental status.
Evaluated in the ED yesterday and subsequent seizure after ED
discharge.

EXAM:
MRI HEAD WITHOUT CONTRAST
TECHNIQUE: Multiplanar, multiecho pulse sequences of the brain and surrounding
structures were obtained without intravenous contrast.

[Series 5: dwi_tracew · axial · 3.0mm · 1.08mm/px · z∈[-39,+78]mm · 7 of 102 slices shown]
[im 1/102]
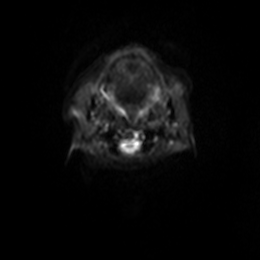
[im 21/102]
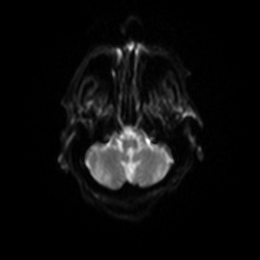
[im 31/102]
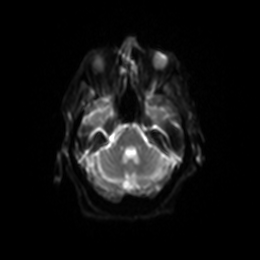
[im 41/102]
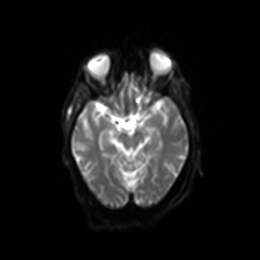
[im 61/102]
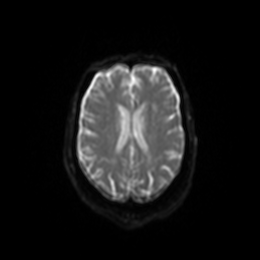
[im 71/102]
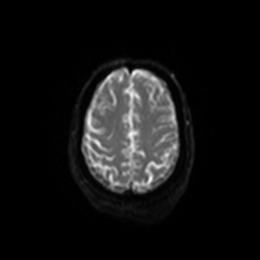
[im 81/102]
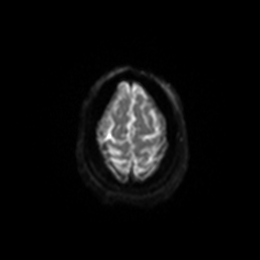

[Series 7: T2 · sagittal · 5.0mm · 0.47mm/px · 2 of 24 slices shown (1 of 4)]
[im 1/24]
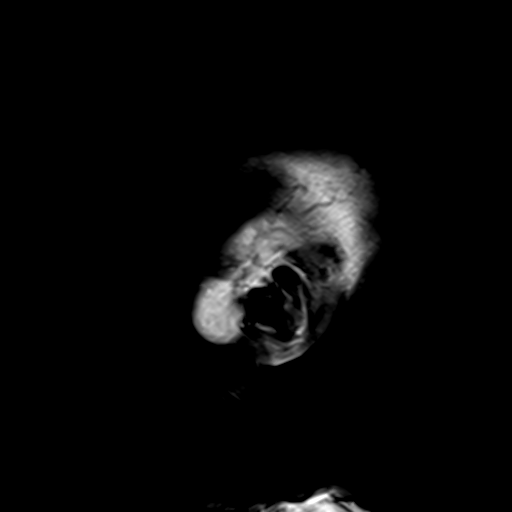
[im 24/24]
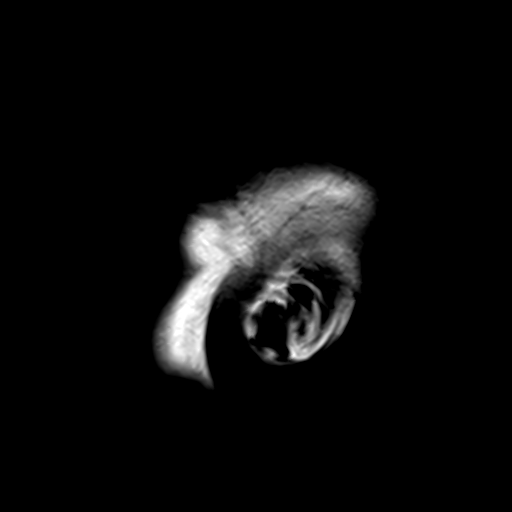

[Series 8: T2 · axial · 5.0mm · 0.45mm/px · z∈[-58,+100]mm · 2 of 26 slices shown (2 of 4)]
[im 1/26]
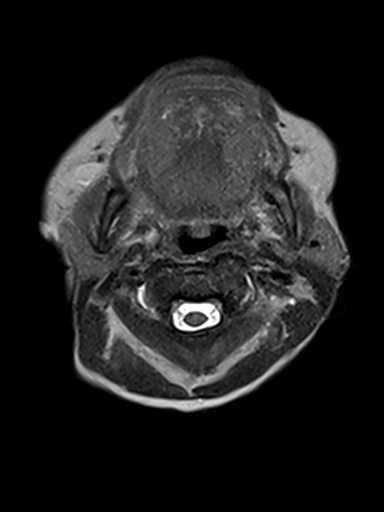
[im 26/26]
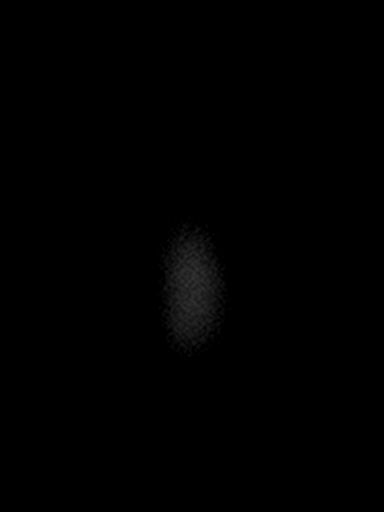

[Series 9: FLAIR · axial · 3.0mm · 0.86mm/px · z∈[-51,+95]mm · 5 of 51 slices shown]
[im 1/51]
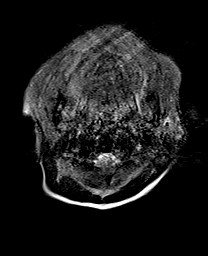
[im 13/51]
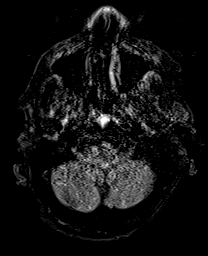
[im 26/51]
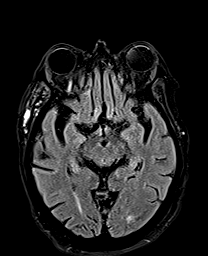
[im 38/51]
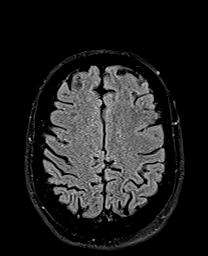
[im 51/51]
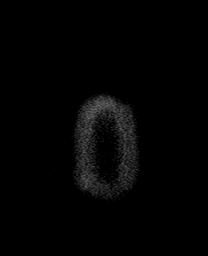

[Series 10: GRE · axial · 3.0mm · 0.45mm/px · z∈[-44,+103]mm · 5 of 51 slices shown]
[im 1/51]
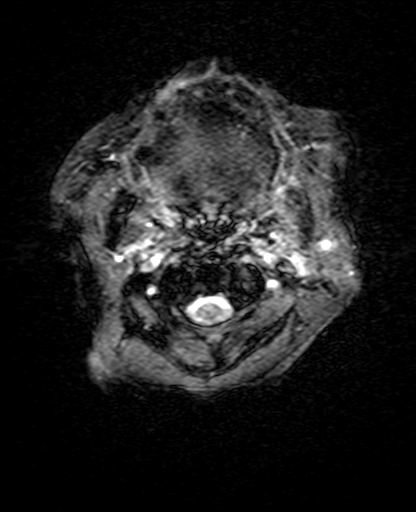
[im 13/51]
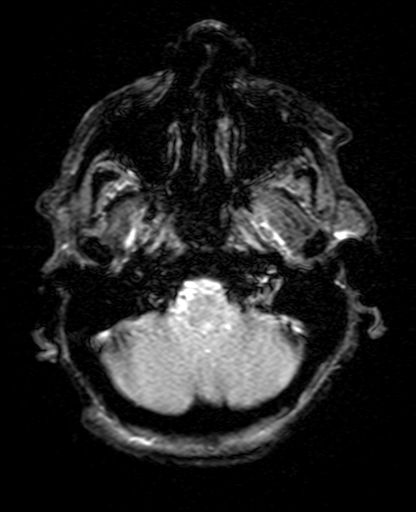
[im 26/51]
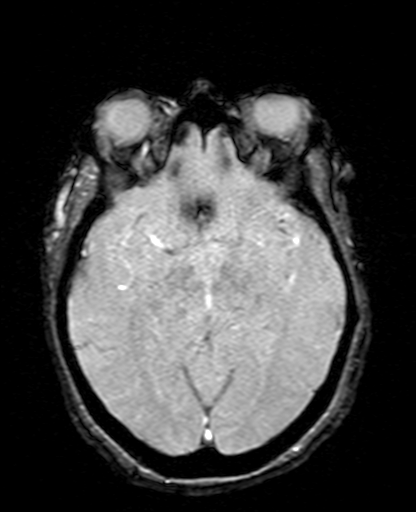
[im 38/51]
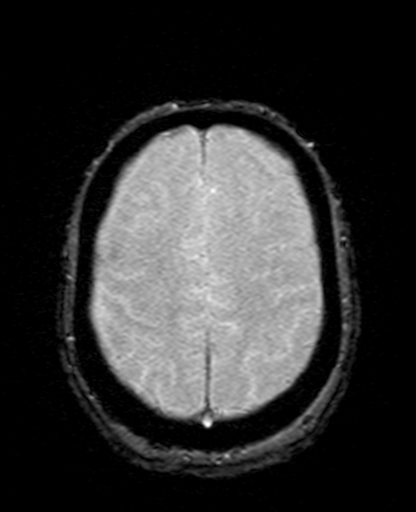
[im 51/51]
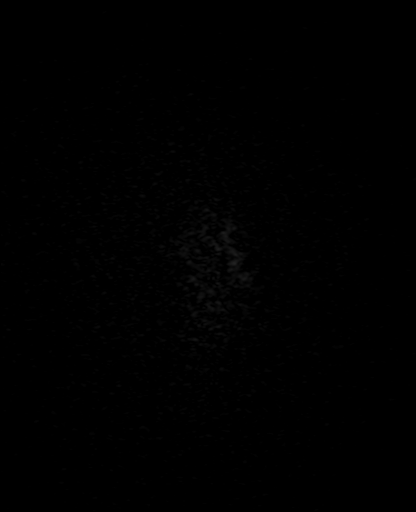

[Series 11: T1 · axial · 3.0mm · 0.45mm/px · z∈[-44,+103]mm · 5 of 51 slices shown]
[im 1/51]
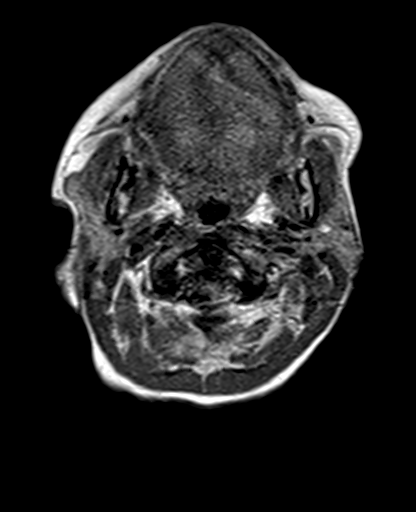
[im 13/51]
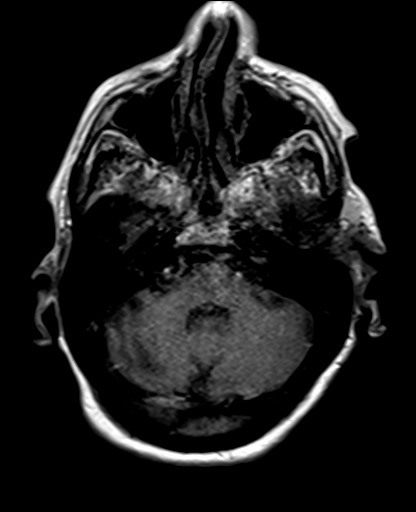
[im 26/51]
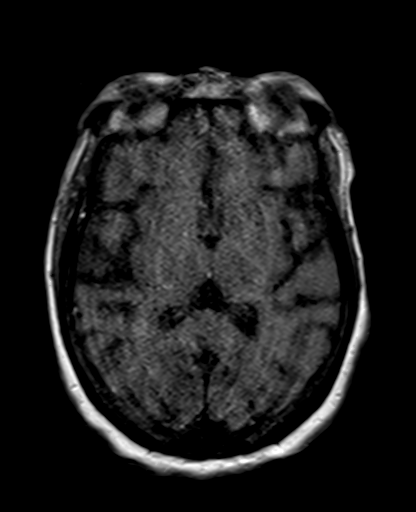
[im 38/51]
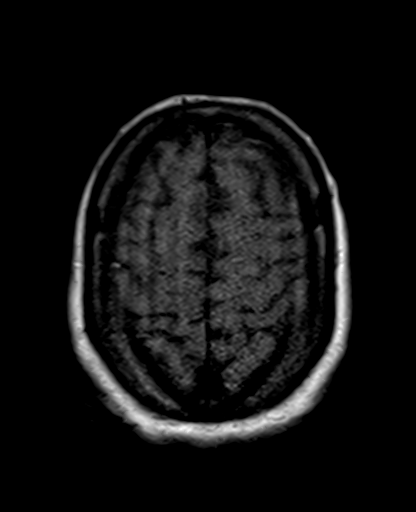
[im 51/51]
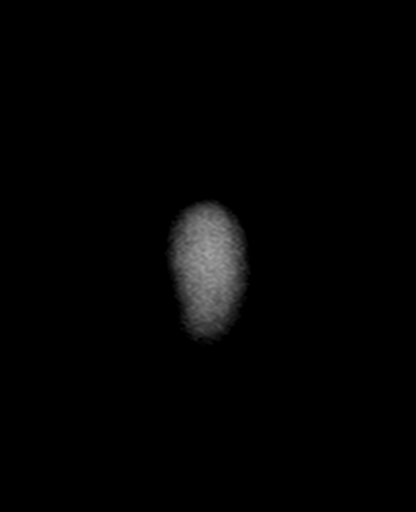

[Series 12: DWI · coronal · 5.0mm · 1.31mm/px · 6 of 64 slices shown (1 of 2)]
[im 1/64]
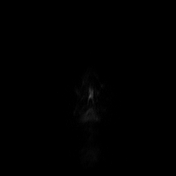
[im 13/64]
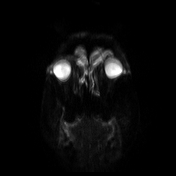
[im 26/64]
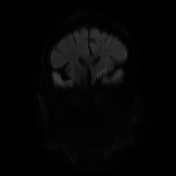
[im 38/64]
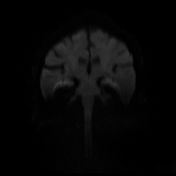
[im 51/64]
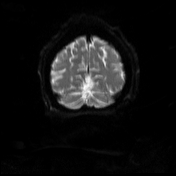
[im 64/64]
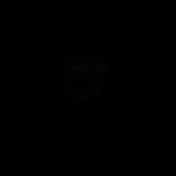

[Series 13: DWI · coronal · 5.0mm · 1.31mm/px · 3 of 31 slices shown (2 of 2)]
[im 1/31]
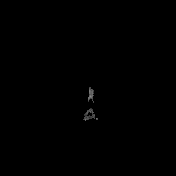
[im 16/31]
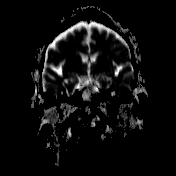
[im 31/31]
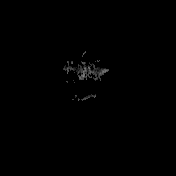

[Series 14: T2 · coronal · 5.0mm · 0.86mm/px · 2 of 26 slices shown (3 of 4)]
[im 1/26]
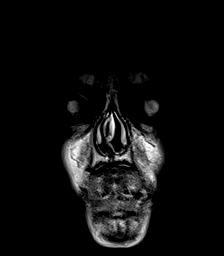
[im 26/26]
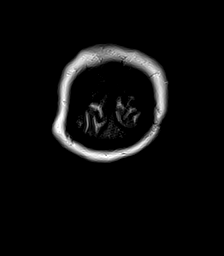

[Series 15: T2 · sagittal · 5.0mm · 0.47mm/px · 2 of 24 slices shown (4 of 4)]
[im 1/24]
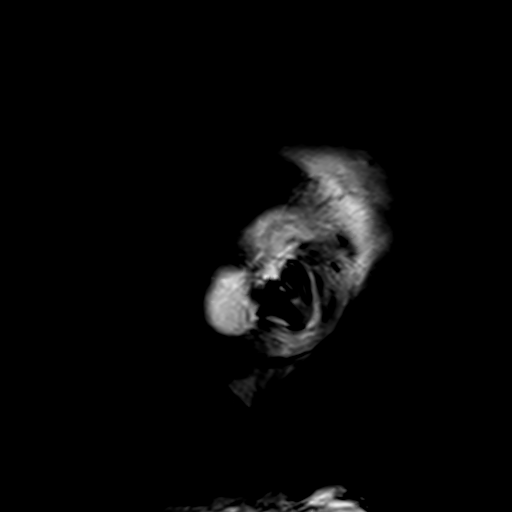
[im 24/24]
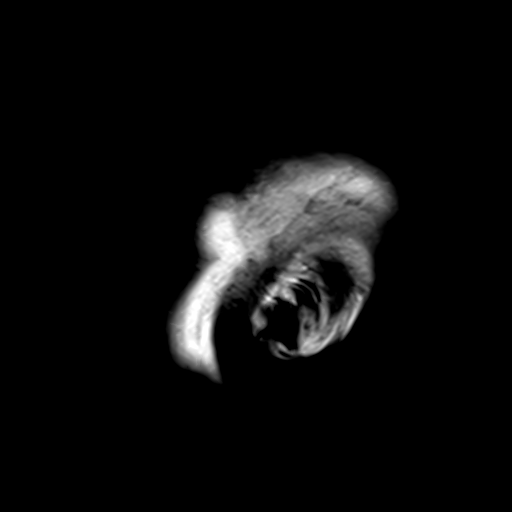

[39 of 48 positions shown; findings below may reference images not displayed]

FINDINGS: Brain: Stable cerebral volume. No restricted diffusion to suggest
acute infarction. No midline shift, mass effect, evidence of mass
lesion, ventriculomegaly, extra-axial collection or acute
intracranial hemorrhage. Cervicomedullary junction and pituitary are
within normal limits.

Study is intermittently degraded by motion artifact despite repeated
imaging attempts. Scattered, patchy bilateral cerebral white matter
T2 and FLAIR hyperintensity appears stable since [REDACTED], mild to
moderate for age and in a nonspecific configuration. No cortical
encephalomalacia or chronic cerebral blood products identified.
Mesial temporal lobe structures appear symmetric. No new signal
abnormality identified. Signal in the deep gray nuclei, brainstem
and cerebellum remains normal. The

Vascular: Major intracranial vascular flow voids appear stable from
last year.

Skull and upper cervical spine: Visible cervical spine degraded by
motion today. Grossly stable bone marrow signal.

Sinuses/Orbits: Stable and negative.

Other: Mastoids remain clear. Small benign appearing midline
nasopharyngeal retention cyst (Tornwaldt cyst) is stable. Otherwise
negative visible scalp and face soft tissues.
IMPRESSION: 1. No acute intracranial abnormality.
2. Stable noncontrast MRI appearance of the brain since [REDACTED]
with some generalized cerebral volume loss and mild to moderate for
age cerebral white matter signal changes, most commonly due to
chronic small vessel disease.

## 2020-03-10 MED ORDER — CHLORHEXIDINE GLUCONATE CLOTH 2 % EX PADS
6.0000 | MEDICATED_PAD | Freq: Every day | CUTANEOUS | Status: DC
  Administered 2020-03-11 – 2020-03-13 (×3): 6 via TOPICAL

## 2020-03-10 MED ORDER — NITROGLYCERIN IN D5W 200-5 MCG/ML-% IV SOLN
0.0000 ug/min | INTRAVENOUS | Status: DC
  Administered 2020-03-10: 5 ug/min via INTRAVENOUS
  Filled 2020-03-10: qty 250

## 2020-03-10 MED ORDER — LABETALOL HCL 5 MG/ML IV SOLN
10.0000 mg | INTRAVENOUS | Status: DC | PRN
  Administered 2020-03-10: 10 mg via INTRAVENOUS
  Filled 2020-03-10: qty 4

## 2020-03-10 MED ORDER — HYDRALAZINE HCL 20 MG/ML IJ SOLN
10.0000 mg | Freq: Once | INTRAMUSCULAR | Status: AC
  Administered 2020-03-10: 10 mg via INTRAVENOUS
  Filled 2020-03-10: qty 1

## 2020-03-10 MED ORDER — HEPARIN SODIUM (PORCINE) 5000 UNIT/ML IJ SOLN
5000.0000 [IU] | Freq: Three times a day (TID) | INTRAMUSCULAR | Status: DC
  Administered 2020-03-10 – 2020-03-13 (×8): 5000 [IU] via SUBCUTANEOUS
  Filled 2020-03-10 (×8): qty 1

## 2020-03-10 MED ORDER — HYDRALAZINE HCL 25 MG PO TABS
25.0000 mg | ORAL_TABLET | Freq: Four times a day (QID) | ORAL | Status: DC
  Administered 2020-03-10 – 2020-03-12 (×6): 25 mg via ORAL
  Filled 2020-03-10 (×6): qty 1

## 2020-03-10 MED ORDER — HYDRALAZINE HCL 20 MG/ML IJ SOLN
20.0000 mg | Freq: Once | INTRAMUSCULAR | Status: AC
  Administered 2020-03-10: 20 mg via INTRAVENOUS

## 2020-03-10 MED ORDER — SODIUM CHLORIDE 0.9 % IV SOLN
100.0000 mL | INTRAVENOUS | Status: DC | PRN
Start: 2020-03-10 — End: 2020-03-13

## 2020-03-10 MED ORDER — SODIUM CHLORIDE 0.9 % IV SOLN: Freq: Once | INTRAVENOUS | Status: AC

## 2020-03-10 MED ORDER — HYDRALAZINE HCL 10 MG PO TABS
10.0000 mg | ORAL_TABLET | Freq: Four times a day (QID) | ORAL | Status: DC
  Administered 2020-03-10: 10 mg via ORAL
  Filled 2020-03-10: qty 1

## 2020-03-10 MED ORDER — PENTAFLUOROPROP-TETRAFLUOROETH EX AERO
1.0000 "application " | INHALATION_SPRAY | CUTANEOUS | Status: DC | PRN
  Filled 2020-03-10: qty 116

## 2020-03-10 MED ORDER — LIDOCAINE HCL (PF) 1 % IJ SOLN: 5.0000 mL | INTRAMUSCULAR | Status: DC | PRN

## 2020-03-10 MED ORDER — SODIUM CHLORIDE 0.9 % IV SOLN: 100.0000 mL | INTRAVENOUS | Status: DC | PRN

## 2020-03-10 MED ORDER — LIDOCAINE-PRILOCAINE 2.5-2.5 % EX CREA
1.0000 "application " | TOPICAL_CREAM | CUTANEOUS | Status: DC | PRN
  Filled 2020-03-10: qty 5
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
## 2020-03-10 NOTE — Progress Notes (Signed)
Orthopedic Tech Progress Note Patient Details:  Bethany [name] [birth year] [id number] Charting from night shift [employer] Type of Ortho Device: Knee Sleeve Ortho Device/Splint Interventions: Ordered      Patient ID: Bethany [name], female   DOB: 09-Mar-1946, 74 y.o.   MRN: [id number]   Bethany [name] 03/10/2020, 8:38 AM

## 2020-03-10 NOTE — Procedures (Signed)
Patient Name: Bethany [name]  MRN: [id number]  Epilepsy Attending: Lora Havens  Referring Physician/Provider: Dr Bernadette Hoit Date: 03/10/2020 Duration: 23.47 mins  Patient history: 74 year old female who presented to [facility] long ED with complaints of strange behavior.  She is also been noted to have several episodes of shaking with loss of consciousness lasting about 25 minutes.  EEG to evaluate for seizures.  Level of alertness: Awake, drowsy, sleep, comatose, lethargic  AEDs during EEG study: None  Technical aspects: This EEG study was done with scalp electrodes positioned according to the 10-20 International system of electrode placement. Electrical activity was acquired at a sampling rate of $Remov'500Hz'KKZImV$  and reviewed with a high frequency filter of $RemoveB'70Hz'lGnCQyNu$  and a low frequency filter of $RemoveB'1Hz'BptOqQrC$ . EEG data were recorded continuously and digitally stored.   Description: No clear posterior dominant rhythm was seen.  EEG showed continuous generalized 3-5 theta-delta slowing.  Intermittent triphasic waves, generalized, maximal bifrontal were also noted.  Hyperventilation and photic stimulation were not performed.     ABNORMALITY -Continuous slow, generalized -Triphasic waves, generalized  IMPRESSION: This study is suggestive of moderate diffuse encephalopathy, nonspecific etiology but likely related to  toxic-metabolic etiology. No seizures or definite epileptiform discharges were seen throughout the recording.    Bethany [name] [name]

## 2020-03-10 NOTE — ED Notes (Signed)
EEG at bedside.
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
## 2020-03-10 NOTE — Plan of Care (Signed)
Initiation of Care Plan Problem: Education: Goal: Knowledge of General Education information will improve Description: Including pain rating scale, medication(s)/side effects and non-pharmacologic comfort measures Outcome: Progressing   Problem: Health Behavior/Discharge Planning: Goal: Ability to manage health-related needs will improve Outcome: Progressing   Problem: Clinical Measurements: Goal: Ability to maintain clinical measurements within normal limits will improve Outcome: Progressing Goal: Will remain free from infection Outcome: Progressing Goal: Diagnostic test results will improve Outcome: Progressing Goal: Respiratory complications will improve Outcome: Progressing Goal: Cardiovascular complication will be avoided Outcome: Progressing   Problem: Activity: Goal: Risk for activity intolerance will decrease Outcome: Progressing   Problem: Nutrition: Goal: Adequate nutrition will be maintained Outcome: Progressing   Problem: Coping: Goal: Level of anxiety will decrease Outcome: Progressing   Problem: Elimination: Goal: Will not experience complications related to bowel motility Outcome: Progressing Goal: Will not experience complications related to urinary retention Outcome: Progressing   Problem: Pain Managment: Goal: General experience of comfort will improve Outcome: Progressing   Problem: Safety: Goal: Ability to remain free from injury will improve Outcome: Progressing   Problem: Skin Integrity: Goal: Risk for impaired skin integrity will decrease Outcome: Progressing   Problem: Education: Goal: Knowledge of disease or condition will improve Outcome: Progressing Goal: Understanding of discharge needs will improve Outcome: Progressing   Problem: Health Behavior/Discharge Planning: Goal: Ability to identify changes in lifestyle to reduce recurrence of condition will improve Outcome: Progressing Goal: Identification of resources available to  assist in meeting health care needs will improve Outcome: Progressing   Problem: Physical Regulation: Goal: Complications related to the disease process, condition or treatment will be avoided or minimized Outcome: Progressing   Problem: Safety: Goal: Ability to remain free from injury will improve Outcome: Progressing   Problem: Safety: Goal: Verbalization of understanding the information provided will improve Outcome: Progressing
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
## 2020-03-10 NOTE — Progress Notes (Signed)
EEG Completed; Results Pending  
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
## 2020-03-10 NOTE — Progress Notes (Signed)
This note also relates to the following rows which could not be included: SpO2 - Cannot attach notes to unvalidated device data  Paged MD to advised of climbing blood pressure, rechecked when Pt closed her eyes, and b/b 161/89

## 2020-03-10 NOTE — ED Notes (Signed)
Patient transported to MRI at this time. 

## 2020-03-10 NOTE — Progress Notes (Signed)
  Echocardiogram 2D Echocardiogram has been performed.  Bethany [name] 03/10/2020, 5:05 PM
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
## 2020-03-11 DIAGNOSIS — Z9115 Patient's noncompliance with renal dialysis: Secondary | ICD-10-CM | POA: Diagnosis not present

## 2020-03-11 DIAGNOSIS — E872 Acidosis: Secondary | ICD-10-CM | POA: Diagnosis present

## 2020-03-11 DIAGNOSIS — Z8249 Family history of ischemic heart disease and other diseases of the circulatory system: Secondary | ICD-10-CM | POA: Diagnosis not present

## 2020-03-11 DIAGNOSIS — R7989 Other specified abnormal findings of blood chemistry: Secondary | ICD-10-CM | POA: Diagnosis not present

## 2020-03-11 DIAGNOSIS — R000 Tachycardia, unspecified: Secondary | ICD-10-CM

## 2020-03-11 DIAGNOSIS — N186 End stage renal disease: Secondary | ICD-10-CM | POA: Diagnosis present

## 2020-03-11 DIAGNOSIS — F17210 Nicotine dependence, cigarettes, uncomplicated: Secondary | ICD-10-CM | POA: Diagnosis present

## 2020-03-11 DIAGNOSIS — F1410 Cocaine abuse, uncomplicated: Secondary | ICD-10-CM | POA: Diagnosis present

## 2020-03-11 DIAGNOSIS — R569 Unspecified convulsions: Secondary | ICD-10-CM | POA: Diagnosis present

## 2020-03-11 DIAGNOSIS — Z79899 Other long term (current) drug therapy: Secondary | ICD-10-CM | POA: Diagnosis not present

## 2020-03-11 DIAGNOSIS — G9341 Metabolic encephalopathy: Secondary | ICD-10-CM | POA: Diagnosis present

## 2020-03-11 DIAGNOSIS — Z681 Body mass index (BMI) 19 or less, adult: Secondary | ICD-10-CM | POA: Diagnosis not present

## 2020-03-11 DIAGNOSIS — R944 Abnormal results of kidney function studies: Secondary | ICD-10-CM | POA: Diagnosis present

## 2020-03-11 DIAGNOSIS — F1210 Cannabis abuse, uncomplicated: Secondary | ICD-10-CM | POA: Diagnosis present

## 2020-03-11 DIAGNOSIS — I10 Essential (primary) hypertension: Secondary | ICD-10-CM | POA: Diagnosis not present

## 2020-03-11 DIAGNOSIS — Z20822 Contact with and (suspected) exposure to covid-19: Secondary | ICD-10-CM | POA: Diagnosis present

## 2020-03-11 DIAGNOSIS — E785 Hyperlipidemia, unspecified: Secondary | ICD-10-CM | POA: Diagnosis present

## 2020-03-11 DIAGNOSIS — I161 Hypertensive emergency: Secondary | ICD-10-CM

## 2020-03-11 DIAGNOSIS — F1910 Other psychoactive substance abuse, uncomplicated: Secondary | ICD-10-CM | POA: Diagnosis not present

## 2020-03-11 DIAGNOSIS — R410 Disorientation, unspecified: Secondary | ICD-10-CM | POA: Diagnosis not present

## 2020-03-11 DIAGNOSIS — E162 Hypoglycemia, unspecified: Secondary | ICD-10-CM | POA: Diagnosis present

## 2020-03-11 DIAGNOSIS — R5381 Other malaise: Secondary | ICD-10-CM | POA: Diagnosis present

## 2020-03-11 DIAGNOSIS — R64 Cachexia: Secondary | ICD-10-CM | POA: Diagnosis present

## 2020-03-11 DIAGNOSIS — I471 Supraventricular tachycardia: Secondary | ICD-10-CM | POA: Diagnosis present

## 2020-03-11 DIAGNOSIS — D631 Anemia in chronic kidney disease: Secondary | ICD-10-CM | POA: Diagnosis present

## 2020-03-11 DIAGNOSIS — J449 Chronic obstructive pulmonary disease, unspecified: Secondary | ICD-10-CM | POA: Diagnosis present

## 2020-03-11 DIAGNOSIS — Z992 Dependence on renal dialysis: Secondary | ICD-10-CM | POA: Diagnosis not present

## 2020-03-11 DIAGNOSIS — E8889 Other specified metabolic disorders: Secondary | ICD-10-CM | POA: Diagnosis present

## 2020-03-11 DIAGNOSIS — I120 Hypertensive chronic kidney disease with stage 5 chronic kidney disease or end stage renal disease: Secondary | ICD-10-CM | POA: Diagnosis present

## 2020-03-11 LAB — CBC WITH DIFFERENTIAL/PLATELET
Abs Immature Granulocytes: 0.01 10*3/uL (ref 0.00–0.07)
Basophils Absolute: 0 10*3/uL (ref 0.0–0.1)
Basophils Relative: 1 %
Eosinophils Absolute: 0.1 10*3/uL (ref 0.0–0.5)
Eosinophils Relative: 2 %
HCT: 40.7 % (ref 36.0–46.0)
Hemoglobin: 12.9 g/dL (ref 12.0–15.0)
Immature Granulocytes: 0 %
Lymphocytes Relative: 20 %
Lymphs Abs: 1 10*3/uL (ref 0.7–4.0)
MCH: 27 pg (ref 26.0–34.0)
MCHC: 31.7 g/dL (ref 30.0–36.0)
MCV: 85.1 fL (ref 80.0–100.0)
Monocytes Absolute: 0.8 10*3/uL (ref 0.1–1.0)
Monocytes Relative: 16 %
Neutro Abs: 3.1 10*3/uL (ref 1.7–7.7)
Neutrophils Relative %: 61 %
Platelets: 240 10*3/uL (ref 150–400)
RBC: 4.78 MIL/uL (ref 3.87–5.11)
RDW: 18.3 % — ABNORMAL HIGH (ref 11.5–15.5)
WBC: 5 10*3/uL (ref 4.0–10.5)
nRBC: 0 % (ref 0.0–0.2)

## 2020-03-11 LAB — COMPREHENSIVE METABOLIC PANEL
ALT: 15 U/L (ref 0–44)
AST: 37 U/L (ref 15–41)
Albumin: 2.8 g/dL — ABNORMAL LOW (ref 3.5–5.0)
Alkaline Phosphatase: 61 U/L (ref 38–126)
Anion gap: 21 — ABNORMAL HIGH (ref 5–15)
BUN: 56 mg/dL — ABNORMAL HIGH (ref 8–23)
CO2: 17 mmol/L — ABNORMAL LOW (ref 22–32)
Calcium: 8.8 mg/dL — ABNORMAL LOW (ref 8.9–10.3)
Chloride: 100 mmol/L (ref 98–111)
Creatinine, Ser: 11.58 mg/dL — ABNORMAL HIGH (ref 0.44–1.00)
GFR calc Af Amer: 3 mL/min — ABNORMAL LOW (ref >60)
GFR calc non Af Amer: 3 mL/min — ABNORMAL LOW (ref >60)
Glucose, Bld: 104 mg/dL — ABNORMAL HIGH (ref 70–99)
Potassium: 5.3 mmol/L — ABNORMAL HIGH (ref 3.5–5.1)
Sodium: 138 mmol/L (ref 135–145)
Total Bilirubin: 1.3 mg/dL — ABNORMAL HIGH (ref 0.3–1.2)
Total Protein: 5.9 g/dL — ABNORMAL LOW (ref 6.5–8.1)

## 2020-03-11 LAB — PROTIME-INR
INR: 1.1 (ref 0.8–1.2)
Prothrombin Time: 13.3 seconds (ref 11.4–15.2)

## 2020-03-11 LAB — RENAL FUNCTION PANEL
Albumin: 2.8 g/dL — ABNORMAL LOW (ref 3.5–5.0)
Anion gap: 20 — ABNORMAL HIGH (ref 5–15)
BUN: 42 mg/dL — ABNORMAL HIGH (ref 8–23)
CO2: 19 mmol/L — ABNORMAL LOW (ref 22–32)
Calcium: 8.7 mg/dL — ABNORMAL LOW (ref 8.9–10.3)
Chloride: 98 mmol/L (ref 98–111)
Creatinine, Ser: 9.03 mg/dL — ABNORMAL HIGH (ref 0.44–1.00)
GFR calc Af Amer: 5 mL/min — ABNORMAL LOW (ref >60)
GFR calc non Af Amer: 4 mL/min — ABNORMAL LOW (ref >60)
Glucose, Bld: 103 mg/dL — ABNORMAL HIGH (ref 70–99)
Phosphorus: 10.3 mg/dL — ABNORMAL HIGH (ref 2.5–4.6)
Potassium: 4 mmol/L (ref 3.5–5.1)
Sodium: 137 mmol/L (ref 135–145)

## 2020-03-11 LAB — APTT: aPTT: 33 seconds (ref 24–36)

## 2020-03-11 LAB — GLUCOSE, CAPILLARY
Glucose-Capillary: 89 mg/dL (ref 70–99)
Glucose-Capillary: 107 mg/dL — ABNORMAL HIGH (ref 70–99)
Glucose-Capillary: 158 mg/dL — ABNORMAL HIGH (ref 70–99)
Glucose-Capillary: 101 mg/dL — ABNORMAL HIGH (ref 70–99)

## 2020-03-11 LAB — CBC
HCT: 40.5 % (ref 36.0–46.0)
Hemoglobin: 12.7 g/dL (ref 12.0–15.0)
MCH: 26.7 pg (ref 26.0–34.0)
MCHC: 31.4 g/dL (ref 30.0–36.0)
MCV: 85.3 fL (ref 80.0–100.0)
Platelets: 292 10*3/uL (ref 150–400)
RBC: 4.75 MIL/uL (ref 3.87–5.11)
RDW: 18.2 % — ABNORMAL HIGH (ref 11.5–15.5)
WBC: 2.6 10*3/uL — ABNORMAL LOW (ref 4.0–10.5)
nRBC: 0 % (ref 0.0–0.2)

## 2020-03-11 LAB — MAGNESIUM: Magnesium: 2.6 mg/dL — ABNORMAL HIGH (ref 1.7–2.4)

## 2020-03-11 MED ORDER — DILTIAZEM HCL 25 MG/5ML IV SOLN
15.0000 mg | INTRAVENOUS | Status: AC
  Administered 2020-03-11: 15 mg via INTRAVENOUS
  Filled 2020-03-11 (×2): qty 5

## 2020-03-11 MED ORDER — ALTEPLASE 2 MG IJ SOLR: 2.0000 mg | Freq: Once | INTRAMUSCULAR | Status: DC | PRN

## 2020-03-11 MED ORDER — DILTIAZEM HCL 60 MG PO TABS
30.0000 mg | ORAL_TABLET | Freq: Three times a day (TID) | ORAL | Status: DC
  Administered 2020-03-11 (×2): 30 mg via ORAL
  Filled 2020-03-11 (×2): qty 1

## 2020-03-11 MED ORDER — HEPARIN SODIUM (PORCINE) 1000 UNIT/ML DIALYSIS: 1000.0000 [IU] | INTRAMUSCULAR | Status: DC | PRN

## 2020-03-11 MED ORDER — DILTIAZEM HCL 60 MG PO TABS
30.0000 mg | ORAL_TABLET | Freq: Once | ORAL | Status: AC
  Administered 2020-03-11: 30 mg via ORAL
  Filled 2020-03-11: qty 1

## 2020-03-11 MED ORDER — SODIUM CHLORIDE 0.9 % IV BOLUS
500.0000 mL | Freq: Once | INTRAVENOUS | Status: AC
  Administered 2020-03-11: 500 mL via INTRAVENOUS

## 2020-03-11 MED ORDER — DILTIAZEM HCL 60 MG PO TABS
60.0000 mg | ORAL_TABLET | Freq: Three times a day (TID) | ORAL | Status: DC
  Administered 2020-03-11 – 2020-03-12 (×2): 60 mg via ORAL
  Filled 2020-03-11 (×2): qty 1

## 2020-03-11 MED ORDER — ROPINIROLE HCL 0.25 MG PO TABS
0.2500 mg | ORAL_TABLET | Freq: Once | ORAL | Status: AC
  Administered 2020-03-11: 0.25 mg via ORAL
  Filled 2020-03-11: qty 1

## 2020-03-11 NOTE — Progress Notes (Signed)
  [facility] Progress Note   Assessment/ Plan:    Dialyzes [facility] TTS 4 hrs EDW 46.5 F180 BFR 400/ DFR 800 2K/ 2.0 Ca bath Access AVG No heparin Mircera 200 q 2 weeks, last given 02/26/20 Calcitriol 1.0 q rx   Assessment/Plan: 1 Seizure-like activity: EEG without seizure activity.  + UDS for cocain, per neuro/ primary 2 ESRD: TTS, missed since 5/8.  HD today 5/15.  Next HD 5/17 with resumption of regular TTS sched thereafter 3 Hypertension: improved with UF 4. Anemia of ESRD: no indication for ESA at this time 5. Metabolic Bone Disease: Calcitriol 1.0 q rx, binders 6.  Nutrition: renal vit with fluid restriction 7.  Dispo: admitted  Subjective:    Feeling OK.  Appears to be back to normal LOC.  On HD.  No complaints.     Objective:   BP 133/71 (BP Location: Right Arm)   Pulse 78   Temp 98.4 F (36.9 C) (Oral)   Resp 19   Ht $R'5\' 4"'Wk$  (1.626 m)   Wt 47.5 kg   SpO2 100%   BMI 17.97 kg/m   Physical Exam: GEN NAD, lying flat in bed HEENT EOMI PERRL  NECK no JVD PULM clear bilaterally no c/w/r CV RRR II/VI systolic murmur with loud S2 ABD soft. nontender NABS EXT no LE edema NEURO AAO x 3 no asterixis ACCESS: LUE AVG + T/B  Labs: BMET Recent Labs  Lab 03/09/20 1615 03/10/20 0733 03/11/20 0300 03/11/20 0459  NA 138  --  138 137  K 5.1  --  5.3* 4.0  CL 95*  --  100 98  CO2 18*  --  17* 19*  GLUCOSE 68*  --  104* 103*  BUN 45*  --  56* 42*  CREATININE 9.83* 10.27* 11.58* 9.03*  CALCIUM 8.5*  --  8.8* 8.7*  PHOS  --   --  >12.0* 10.3*   CBC Recent Labs  Lab 03/09/20 1615 03/10/20 0733 03/11/20 0300 03/11/20 0459  WBC 6.7 6.3 5.0 2.6*  NEUTROABS 5.3  --  3.1  --   HGB 13.3 12.9 12.9 12.7  HCT 44.0 41.0 40.7 40.5  MCV 89.2 87.6 85.1 85.3  PLT 229 293 240 292      Medications:    . Chlorhexidine Gluconate Cloth  6 each Topical Q0600  . diltiazem  30 mg Oral Q8H  . heparin  5,000 Units Subcutaneous Q8H  . hydrALAZINE  25 mg Oral Q6H      Madelon Lips, MD 03/11/2020, 9:21 AM

## 2020-03-11 NOTE — Progress Notes (Signed)
Cardizem '15mg'$  given $Remo'@1040'rAigq$  per MD order, recent HR is running between 120-170, pt is asymptomatic, will closely monitor  Palma Holter, RN

## 2020-03-11 NOTE — Progress Notes (Addendum)
Pt received from HD at 0853.Marland Kitchen Pt's BP is 133/71. Pt's HR running 160-170. Paged Dr [place] via secure chart and waiting for the orders to come Pt is asymptomatic, stat EKG in place [place], [place]

## 2020-03-11 NOTE — Plan of Care (Signed)
  Problem: Education: Goal: Knowledge of General Education information will improve Description: Including pain rating scale, medication(s)/side effects and non-pharmacologic comfort measures Outcome: Progressing   Problem: Health Behavior/Discharge Planning: Goal: Ability to manage health-related needs will improve Outcome: Progressing   Problem: Clinical Measurements: Goal: Ability to maintain clinical measurements within normal limits will improve Outcome: Progressing Goal: Will remain free from infection Outcome: Progressing Goal: Diagnostic test results will improve Outcome: Progressing Goal: Respiratory complications will improve Outcome: Progressing Goal: Cardiovascular complication will be avoided Outcome: Progressing   Problem: Activity: Goal: Risk for activity intolerance will decrease Outcome: Progressing   Problem: Nutrition: Goal: Adequate nutrition will be maintained Outcome: Progressing   Problem: Coping: Goal: Level of anxiety will decrease Outcome: Progressing   Problem: Elimination: Goal: Will not experience complications related to bowel motility Outcome: Progressing Goal: Will not experience complications related to urinary retention Outcome: Progressing   Problem: Pain Managment: Goal: General experience of comfort will improve Outcome: Progressing   Problem: Safety: Goal: Ability to remain free from injury will improve Outcome: Progressing   Problem: Skin Integrity: Goal: Risk for impaired skin integrity will decrease Outcome: Progressing   Problem: Education: Goal: Knowledge of disease or condition will improve Outcome: Progressing Goal: Understanding of discharge needs will improve Outcome: Progressing   Problem: Health Behavior/Discharge Planning: Goal: Ability to identify changes in lifestyle to reduce recurrence of condition will improve Outcome: Progressing Goal: Identification of resources available to assist in meeting health  care needs will improve Outcome: Progressing   Problem: Physical Regulation: Goal: Complications related to the disease process, condition or treatment will be avoided or minimized Outcome: Progressing   Problem: Safety: Goal: Ability to remain free from injury will improve Outcome: Progressing   Problem: Safety: Goal: Verbalization of understanding the information provided will improve Outcome: Progressing

## 2020-03-11 NOTE — Procedures (Signed)
Patient seen and examined on Hemodialysis. BP 133/71 (BP Location: Right Arm)   Pulse 78   Temp 98.4 F (36.9 C) (Oral)   Resp 19   Ht $R'5\' 4"'AF$  (1.626 m)   Wt 47.5 kg   SpO2 100%   BMI 17.97 kg/m   QB 400 mL/ min via AVG UF goal 3L  Tolerating treatment without complaints at this time.  Madelon Lips MD [facility] pgr [phone] 9:25 AM

## 2020-03-11 NOTE — Progress Notes (Signed)
   03/11/20 1038  Assess: MEWS Score  Temp 98.4 F (36.9 C)  BP 109/86  Pulse Rate (!) 165  ECG Heart Rate (!) 167  Resp 20  SpO2 100 %  O2 Device Room Air  Assess: MEWS Score  MEWS Temp 0  MEWS Systolic 0  MEWS Pulse 3  MEWS RR 0  MEWS LOC 0  MEWS Score 3  MEWS Score Color Yellow  Assess: if the MEWS score is Yellow or Red  Were vital signs taken at a resting state? Yes  Focused Assessment Documented focused assessment  Early Detection of Sepsis Score *See Row Information* Medium  MEWS guidelines implemented *See Row Information* Yes  Treat  MEWS Interventions Administered scheduled meds/treatments  Take Vital Signs  Increase Vital Sign Frequency  Yellow: Q 2hr X 2 then Q 4hr X 2, if remains yellow, continue Q 4hrs  Escalate  MEWS: Escalate Yellow: discuss with charge nurse/RN and consider discussing with provider and RRT  Notify: Charge Nurse/RN  Name of Charge Nurse/RN Notified Bernadette RN  Date Charge Nurse/RN Notified 03/11/20  Notify: Provider  Provider Name/Title [place] Eric  Date Provider Notified 03/11/20  Time Provider Notified 1035  Notification Type Page  Notification Reason Change in status  Response See new orders  Date of Provider Response 03/11/20  Time of Provider Response 1036  Document  Patient Outcome Not stable and remains on department  Progress note created (see row info) Yes

## 2020-03-11 NOTE — Plan of Care (Signed)

## 2020-03-11 NOTE — Progress Notes (Addendum)
NEURO HOSPITALIST PROGRESS NOTE   Subjective: Patient awake, alert, in bed eating breakfast.  Patient just returned from dialysis treatment.  Exam: Vitals:   03/11/20 0828 03/11/20 0855  BP: 122/60 133/71  Pulse: 80 78  Resp: 20 19  Temp: 98.1 F (36.7 C) 98.4 F (36.9 C)  SpO2: 100% 100%    Physical Exam  Constitutional: Appears well-developed and well-nourished.  Psych: Affect appropriate to situation Eyes: Normal external eye and conjunctiva. HENT: Normocephalic, no lesions, without obvious abnormality.   Musculoskeletal-no joint tenderness, deformity or swelling Cardiovascular: Normal rate and regular rhythm.  Respiratory: Effort normal, non-labored breathing saturations WNL GI: Soft.  No distension. There is no tenderness.  Skin: WDI   Neuro:  Mental Status: Alert, oriented, to name, age, month, place, year, thought content appropriate.  Naming and repetition intact.  Attention\concentration still affected speech fluent without evidence of aphasia.  Able to follow  commands without difficulty. Cranial Nerves: II:  Visual fields grossly normal,  III,IV, VI: ptosis not present, extra-ocular motions intact bilaterally pupils equal, round, reactive to light and accommodation V,VII: smile symmetric, facial light touch sensation normal bilaterally VIII: hearing normal bilaterally IX,X: uvula rises symmetrically XI: bilateral shoulder shrug XII: midline tongue extension Motor: Able to raise all 4 extremities antigravity, asterixis still present Tone and bulk:normal tone throughout; no atrophy noted Sensory: Cool temp and light touch light touch intact throughout, bilaterally Deep Tendon Reflexes: 1+ and symmetric biceps and patella Plantars: Right: downgoing   Left: downgoing Cerebellar: Asterixis in bilateral upper extremities Gait: Deferred    Medications:  Scheduled: . Chlorhexidine Gluconate Cloth  6 each Topical Q0600  . diltiazem  15 mg  Intravenous STAT  . diltiazem  30 mg Oral Q8H  . heparin  5,000 Units Subcutaneous Q8H  . hydrALAZINE  25 mg Oral Q6H   Continuous: . sodium chloride    . sodium chloride     FXT:KWIOXB chloride, sodium chloride, labetalol, lidocaine (PF), lidocaine-prilocaine, pentafluoroprop-tetrafluoroeth  Pertinent Labs/Diagnostics:   CT HEAD WO CONTRAST  Result Date: 03/09/2020 CLINICAL DATA:  Altered mental status, facial trauma EXAM: CT HEAD WITHOUT CONTRAST CT MAXILLOFACIAL WITHOUT CONTRAST TECHNIQUE: Multidetector CT imaging of the head and maxillofacial structures were performed using the standard protocol without intravenous contrast. Multiplanar CT image reconstructions of the maxillofacial structures were also generated. COMPARISON:  MRI 10/24/2019, CT 10/24/2019 FINDINGS: CT HEAD FINDINGS Brain: No evidence of acute infarction, hemorrhage, hydrocephalus, extra-axial collection or mass lesion/mass effect. Symmetric prominence of the ventricles, cisterns and sulci compatible with parenchymal volume loss. Patchy areas of white matter hypoattenuation are most compatible with chronic microvascular angiopathy. Vascular: Atherosclerotic calcification of the carotid siphons and intradural vertebral arteries. No hyperdense vessel. Skull: No calvarial fracture or suspicious osseous lesion. No scalp swelling or hematoma. Other: None CT MAXILLOFACIAL FINDINGS Osseous: No fracture of the bony orbits. Mild bilateral deformity of the nasal bones without overlying swelling favoring remote finding. No other mid face fractures are seen. The pterygoid plates are intact. The mandible is intact. 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 No temporal bone fractures are identified. Patient is edentulous. Orbits: No retro septal gas, stranding or hemorrhage. The globes appear normal and symmetric. Symmetric appearance of the extraocular musculature and optic nerve  sheath complexes. Normal caliber of the superior ophthalmic veins. Sinuses: Paranasal sinuses are predominantly clear.  Slight rightward nasal septal deviation with a right-sided nasal septal spur. Mastoid air cells are well aerated. Middle ear cavities are clear. Ossicular chains appear normally configured. Pneumatization of the petrous apices. Soft tissues: Mild left supraorbital soft tissue thickening. No large hematoma, soft tissue gas or foreign body. Cervical carotid and vertebral artery atherosclerosis. Limited cervical: Craniocervical atlantoaxial articulations are normally aligned. Reversal the normal cervical lordosis. No traumatic listhesis. No visible cervical spine fracture. Multilevel cervical spondylitic changes are seen throughout the included portions of the cervical spine most pronounced at the C5-C7 levels. Bony fusion of the C4-5 articular facets is noted. No abnormal facet widening, perched or jumped facets. Small degenerative os odontoideum. IMPRESSION: 1. No acute intracranial abnormality. 2. Mild to moderate parenchymal volume loss and chronic microvascular angiopathy changes. 3. Mild left supraorbital soft tissue thickening without large hematoma, soft tissue gas or foreign body. 4. Mild bilateral deformity of the nasal bones without overlying swelling favoring remote finding. Correlate for point tenderness. 5. Multilevel cervical spondylitic changes most pronounced at the C5-C7 levels or sclerosis is favored to be degenerative in the absence of infectious symptoms. 6. Cervical and vertebral artery atherosclerosis. 7. 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 Electronically Signed   By: Lovena Le M.D.   On: 03/09/2020 19:08   MR BRAIN WO CONTRAST  Result Date: 03/10/2020 CLINICAL DATA:  74 year old female with altered mental status. Evaluated in the ED yesterday and subsequent seizure after ED discharge. EXAM: MRI HEAD WITHOUT CONTRAST  TECHNIQUE: Multiplanar, multiecho pulse sequences of the brain and surrounding structures were obtained without intravenous contrast. COMPARISON:  Head and face CT yesterday. Brain MRI 10/24/2019. FINDINGS: Brain: Stable cerebral volume. No restricted diffusion to suggest acute infarction. No midline shift, mass effect, evidence of mass lesion, ventriculomegaly, extra-axial collection or acute intracranial hemorrhage. Cervicomedullary junction and pituitary are within normal limits. Study is intermittently degraded by motion artifact despite repeated imaging attempts. Scattered, patchy bilateral cerebral white matter T2 and FLAIR hyperintensity appears stable since December, mild to moderate for age and in a nonspecific configuration. No cortical encephalomalacia or chronic cerebral blood products identified. Mesial temporal lobe structures appear symmetric. No new signal abnormality identified. Signal in the deep gray nuclei, brainstem and cerebellum remains normal. The Vascular: Major intracranial vascular flow voids appear stable from last year. Skull and upper cervical spine: Visible cervical spine degraded by motion today. Grossly stable bone marrow signal. Sinuses/Orbits: Stable and negative. Other: Mastoids remain clear. 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 Otherwise negative visible scalp and face soft tissues. IMPRESSION: 1. No acute intracranial abnormality. 2. Stable noncontrast MRI appearance of the brain since December with some generalized cerebral volume loss and mild to moderate for age cerebral white matter signal changes, most commonly due to chronic small vessel disease. Electronically Signed   By: Genevie Ann M.D.   On: 03/10/2020 09:52   DG Chest Port 1 View  Result Date: 03/09/2020 CLINICAL DATA:  Altered mental status with chest pain, initial encounter EXAM: PORTABLE CHEST 1 VIEW COMPARISON:  12/24/2019 FINDINGS: Cardiac shadow is enlarged  but stable. Aortic calcifications are again seen. Chronic interstitial changes are noted improved from the prior exam and likely representing an underlying chronic fibrotic component. No definitive edema is seen. No focal infiltrate is noted. No acute bony abnormality is seen. IMPRESSION: Chronic appearing interstitial changes without superimposed acute abnormality. Electronically Signed   By: Linus Mako.D.  On: 03/09/2020 16:33   EEG adult  Result Date: 03/10/2020 Lora Havens, MD     03/10/2020 12:15 PM Patient Name: Falan Hensler MRN: [id number] Epilepsy Attending: Lora Havens Referring Physician/Provider: Dr Bernadette Hoit Date: 03/10/2020 Duration: 23.47 mins Patient history: 74 year old female who presented to [facility] ED with complaints of strange behavior.  She is also been noted to have several episodes of shaking with loss of consciousness lasting about 25 minutes.  EEG to evaluate for seizures. Level of alertness: Awake, drowsy, sleep, comatose, lethargic AEDs during EEG study: None Technical aspects: This EEG study was done with scalp electrodes positioned according to the 10-20 International system of electrode placement. Electrical activity was acquired at a sampling rate of $Remov'500Hz'LIEUXD$  and reviewed with a high frequency filter of $RemoveB'70Hz'NlukadqL$  and a low frequency filter of $RemoveB'1Hz'rOZfpfGz$ . EEG data were recorded continuously and digitally stored. Description: No clear posterior dominant rhythm was seen.  EEG showed continuous generalized 3-5 theta-delta slowing.  Intermittent triphasic waves, generalized, maximal bifrontal were also noted.  Hyperventilation and photic stimulation were not performed.   ABNORMALITY -Continuous slow, generalized -Triphasic waves, generalized IMPRESSION: This study is suggestive of moderate diffuse encephalopathy, nonspecific etiology but likely related to  toxic-metabolic etiology. No seizures or definite epileptiform discharges were seen throughout the recording. Lora Havens    ECHOCARDIOGRAM COMPLETE  Result Date: 03/10/2020    ECHOCARDIOGRAM REPORT   Patient Name:   LYNSI DOONER Date of Exam: 03/10/2020 Medical Rec #:  [id number]       Height:       64.0 in Accession #:    [id number]      Weight:       108.0 lb Date of Birth:  1945/11/17       BSA:          1.506 m Patient Age:    74 years        BP:           146/85 mmHg Patient Gender: F               HR:           97 bpm. Exam Location:  Inpatient Procedure: 2D Echo Indications:    cocaine abuse  History:        Patient has no prior history of Echocardiogram examinations. End                 stage renal disease; Risk Factors:Current Smoker and                 Hypertension.  Sonographer:    Johny Chess Referring Phys: [id number] [place]  1. Left ventricular ejection fraction, by estimation, is 55 to 60%. The left ventricle has normal function. The left ventricle has no regional wall motion abnormalities. Left ventricular diastolic parameters are consistent with Grade II diastolic dysfunction (pseudonormalization). Elevated left ventricular end-diastolic pressure.  2. Right ventricular systolic function is normal. The right ventricular size is normal. There is normal pulmonary artery systolic pressure.  3. Left atrial size was mildly dilated.  4. The mitral valve is normal in structure. No evidence of mitral valve regurgitation. No evidence of mitral stenosis.  5. The aortic valve has an indeterminant number of cusps. Aortic valve regurgitation is not visualized. No aortic stenosis is present.  6. The inferior vena cava is normal in size with greater than 50% respiratory variability, suggesting right atrial pressure of 3 mmHg. FINDINGS  Left Ventricle:  Left ventricular ejection fraction, by estimation, is 55 to 60%. The left ventricle has normal function. The left ventricle has no regional wall motion abnormalities. The left ventricular internal cavity size was normal in size. There is  no left  ventricular hypertrophy. Left ventricular diastolic parameters are consistent with Grade II diastolic dysfunction (pseudonormalization). Elevated left ventricular end-diastolic pressure. Right Ventricle: The right ventricular size is normal. No increase in right ventricular wall thickness. Right ventricular systolic function is normal. There is normal pulmonary artery systolic pressure. The tricuspid regurgitant velocity is 0.22 m/s, and  with an assumed right atrial pressure of 3 mmHg, the estimated right ventricular systolic pressure is 3.2 mmHg. Left Atrium: Left atrial size was mildly dilated. Right Atrium: Right atrial size was normal in size. Pericardium: There is no evidence of pericardial effusion. Mitral Valve: The mitral valve is normal in structure. Normal mobility of the mitral valve leaflets. Moderate mitral annular calcification. No evidence of mitral valve regurgitation. No evidence of mitral valve stenosis. Tricuspid Valve: The tricuspid valve is normal in structure. Tricuspid valve regurgitation is trivial. No evidence of tricuspid stenosis. Aortic Valve: The aortic valve has an indeterminant number of cusps. . There is moderate thickening and severe calcifcation of the aortic valve. Aortic valve regurgitation is not visualized. No aortic stenosis is present. There is moderate thickening of the aortic valve. There is severe calcifcation of the aortic valve. Pulmonic Valve: The pulmonic valve was normal in structure. Pulmonic valve regurgitation is not visualized. No evidence of pulmonic stenosis. Aorta: The aortic root is normal in size and structure. Venous: The inferior vena cava is normal in size with greater than 50% respiratory variability, suggesting right atrial pressure of 3 mmHg. IAS/Shunts: There is right bowing of the interatrial septum, suggestive of elevated left atrial pressure. There is redundancy of the interatrial septum. No atrial level shunt detected by color flow Doppler.  LEFT  VENTRICLE PLAX 2D LVIDd:         4.30 cm  Diastology LVIDs:         3.10 cm  LV e' lateral:   7.83 cm/s LV PW:         1.10 cm  LV E/e' lateral: 15.8 LV IVS:        0.90 cm  LV e' medial:    6.20 cm/s LVOT diam:     1.70 cm  LV E/e' medial:  20.0 LV SV:         63 LV SV Index:   42 LVOT Area:     2.27 cm  RIGHT VENTRICLE TAPSE (M-mode): 2.2 cm LEFT ATRIUM             Index       RIGHT ATRIUM           Index LA diam:        3.20 cm 2.13 cm/m  RA Area:     13.10 cm LA Vol (A2C):   42.3 ml 28.09 ml/m RA Volume:   28.70 ml  19.06 ml/m LA Vol (A4C):   58.5 ml 38.85 ml/m LA Biplane Vol: 52.3 ml 34.74 ml/m  AORTIC VALVE LVOT Vmax:   189.00 cm/s LVOT Vmean:  98.800 cm/s LVOT VTI:    0.277 m  AORTA Ao Root diam: 2.70 cm MITRAL VALVE                TRICUSPID VALVE MV Area (PHT): 3.21 cm     TR Peak grad:   0.2 mmHg MV Decel  Time: 236 msec     TR Vmax:        22.10 cm/s MV E velocity: 124.00 cm/s MV A velocity: 151.00 cm/s  SHUNTS MV E/A ratio:  0.82         Systemic VTI:  0.28 m                             Systemic Diam: 1.70 cm Fransico Him MD Electronically signed by Fransico Him MD Signature Date/Time: 03/10/2020/6:49:11 PM    Final    CT Maxillofacial WO CM  Result Date: 03/09/2020 CLINICAL DATA:  Altered mental status, facial trauma EXAM: CT HEAD WITHOUT CONTRAST CT MAXILLOFACIAL WITHOUT CONTRAST TECHNIQUE: Multidetector CT imaging of the head and maxillofacial structures were performed using the standard protocol without intravenous contrast. Multiplanar CT image reconstructions of the maxillofacial structures were also generated. COMPARISON:  MRI 10/24/2019, CT 10/24/2019 FINDINGS: CT HEAD FINDINGS Brain: No evidence of acute infarction, hemorrhage, hydrocephalus, extra-axial collection or mass lesion/mass effect. Symmetric prominence of the ventricles, cisterns and sulci compatible with parenchymal volume loss. Patchy areas of white matter hypoattenuation are most compatible with chronic microvascular  angiopathy. Vascular: Atherosclerotic calcification of the carotid siphons and intradural vertebral arteries. No hyperdense vessel. Skull: No calvarial fracture or suspicious osseous lesion. No scalp swelling or hematoma. Other: None CT MAXILLOFACIAL FINDINGS Osseous: No fracture of the bony orbits. Mild bilateral deformity of the nasal bones without overlying swelling favoring remote finding. No other mid face fractures are seen. The pterygoid plates are intact. The mandible is intact. 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 No temporal bone fractures are identified. Patient is edentulous. Orbits: No retro septal gas, stranding or hemorrhage. The globes appear normal and symmetric. Symmetric appearance of the extraocular musculature and optic nerve sheath complexes. Normal caliber of the superior ophthalmic veins. Sinuses: Paranasal sinuses are predominantly clear. Slight rightward nasal septal deviation with a right-sided nasal septal spur. Mastoid air cells are well aerated. Middle ear cavities are clear. Ossicular chains appear normally configured. Pneumatization of the petrous apices. Soft tissues: Mild left supraorbital soft tissue thickening. No large hematoma, soft tissue gas or foreign body. Cervical carotid and vertebral artery atherosclerosis. Limited cervical: Craniocervical atlantoaxial articulations are normally aligned. Reversal the normal cervical lordosis. No traumatic listhesis. No visible cervical spine fracture. Multilevel cervical spondylitic changes are seen throughout the included portions of the cervical spine most pronounced at the C5-C7 levels. Bony fusion of the C4-5 articular facets is noted. No abnormal facet widening, perched or jumped facets. Small degenerative os odontoideum. IMPRESSION: 1. No acute intracranial abnormality. 2. Mild to moderate parenchymal volume loss and chronic microvascular angiopathy changes. 3. Mild  left supraorbital soft tissue thickening without large hematoma, soft tissue gas or foreign body. 4. Mild bilateral deformity of the nasal bones without overlying swelling favoring remote finding. Correlate for point tenderness. 5. Multilevel cervical spondylitic changes most pronounced at the C5-C7 levels or sclerosis is favored to be degenerative in the absence of infectious symptoms. 6. Cervical and vertebral artery atherosclerosis. 7. 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 Electronically Signed   By: Lovena Le M.D.   On: 03/09/2020 19:08   Assessment:   74 y.o. female  with PMH tobaccos abuse, HTN, HLD, ESRD (on dialysis), COPD who presented to [facility] ED with c/o "strange behavior". MRI did not show anything acute.  Generalized cerebral volume loss. UDS was + cocaine.  EEG done yesterday did not show any seizures or definite epileptiform discharges.   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 On my exam, her asterixis is significantly improved.  This would be supportive of the idea that her missed dialysis was the reason for her worsening myoclonus.  With her positive cocaine, I think that her seizure very well could have been provoked, and I would not favor starting antiepileptic therapy.  She continues to have some difficulty with orientation, and I suspect that she has some memory difficulty at baseline.  The fact that she is improving is reassuring.  No further specific therapy other than not missing dialysis and avoiding cocaine.  Roland Rack, MD [facility] [phone]  If 7pm- 7am,  please page neurology on call as listed in [place].

## 2020-03-11 NOTE — Progress Notes (Signed)
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 configured. Pneumatization of the petrous apices. Soft tissues: Mild left supraorbital soft tissue thickening. No large hematoma, soft tissue gas or foreign body. Cervical carotid and vertebral artery atherosclerosis. Limited cervical: Craniocervical atlantoaxial articulations are normally aligned. Reversal the normal cervical lordosis. No traumatic listhesis. No visible cervical spine fracture. Multilevel cervical spondylitic changes are seen throughout the included portions of the cervical spine most pronounced at the C5-C7 levels. Bony fusion of the C4-5 articular facets is noted. No  abnormal facet widening, perched or jumped facets. Small degenerative os odontoideum. IMPRESSION: 1. No acute intracranial abnormality. 2. Mild to moderate parenchymal volume loss and chronic microvascular angiopathy changes. 3. Mild left supraorbital soft tissue thickening without large hematoma, soft tissue gas or foreign body. 4. Mild bilateral deformity of the nasal bones without overlying swelling favoring  remote finding. Correlate for point tenderness. 5. Multilevel cervical spondylitic changes most pronounced at the C5-C7 levels or sclerosis is favored to be degenerative in the absence of infectious symptoms. 6. Cervical and vertebral artery atherosclerosis. 7. 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 Electronically Signed   By: Lovena Le M.D.   On: 03/09/2020 19:08        Scheduled Meds: . Chlorhexidine Gluconate Cloth  6 each Topical Q0600  . diltiazem  30 mg Oral Q8H  . heparin  5,000 Units Subcutaneous Q8H  . hydrALAZINE  25 mg Oral Q6H   Continuous Infusions: . sodium chloride    . sodium chloride       LOS: 0 days    Time spent: 39 minutes spent on chart review, discussion with nursing staff, consultants, updating family and interview/physical exam; more than 50% of that time was spent in counseling and/or coordination of care.    Halie Gass J [place] DO [facility] Available via Epic secure chat 7am-7pm After these hours, please refer to coverage provider listed on [email or web address] 03/11/2020, 10:12 AM

## 2020-03-12 LAB — COMPREHENSIVE METABOLIC PANEL
ALT: 15 U/L (ref 0–44)
AST: 26 U/L (ref 15–41)
Albumin: 2.7 g/dL — ABNORMAL LOW (ref 3.5–5.0)
Alkaline Phosphatase: 61 U/L (ref 38–126)
Anion gap: 17 — ABNORMAL HIGH (ref 5–15)
BUN: 19 mg/dL (ref 8–23)
CO2: 20 mmol/L — ABNORMAL LOW (ref 22–32)
Calcium: 8.7 mg/dL — ABNORMAL LOW (ref 8.9–10.3)
Chloride: 101 mmol/L (ref 98–111)
Creatinine, Ser: 6.57 mg/dL — ABNORMAL HIGH (ref 0.44–1.00)
GFR calc Af Amer: 7 mL/min — ABNORMAL LOW (ref >60)
GFR calc non Af Amer: 6 mL/min — ABNORMAL LOW (ref >60)
Glucose, Bld: 82 mg/dL (ref 70–99)
Potassium: 4.1 mmol/L (ref 3.5–5.1)
Sodium: 138 mmol/L (ref 135–145)
Total Bilirubin: 0.9 mg/dL (ref 0.3–1.2)
Total Protein: 6 g/dL — ABNORMAL LOW (ref 6.5–8.1)

## 2020-03-12 LAB — GLUCOSE, CAPILLARY
Glucose-Capillary: 82 mg/dL (ref 70–99)
Glucose-Capillary: 94 mg/dL (ref 70–99)
Glucose-Capillary: 107 mg/dL — ABNORMAL HIGH (ref 70–99)
Glucose-Capillary: 108 mg/dL — ABNORMAL HIGH (ref 70–99)

## 2020-03-12 LAB — CBC WITH DIFFERENTIAL/PLATELET
Abs Immature Granulocytes: 0.01 10*3/uL (ref 0.00–0.07)
Basophils Absolute: 0 10*3/uL (ref 0.0–0.1)
Basophils Relative: 0 %
Eosinophils Absolute: 0.1 10*3/uL (ref 0.0–0.5)
Eosinophils Relative: 2 %
HCT: 40.4 % (ref 36.0–46.0)
Hemoglobin: 12.7 g/dL (ref 12.0–15.0)
Immature Granulocytes: 0 %
Lymphocytes Relative: 24 %
Lymphs Abs: 1.2 10*3/uL (ref 0.7–4.0)
MCH: 27.2 pg (ref 26.0–34.0)
MCHC: 31.4 g/dL (ref 30.0–36.0)
MCV: 86.5 fL (ref 80.0–100.0)
Monocytes Absolute: 1 10*3/uL (ref 0.1–1.0)
Monocytes Relative: 21 %
Neutro Abs: 2.6 10*3/uL (ref 1.7–7.7)
Neutrophils Relative %: 53 %
Platelets: 282 10*3/uL (ref 150–400)
RBC: 4.67 MIL/uL (ref 3.87–5.11)
RDW: 18.5 % — ABNORMAL HIGH (ref 11.5–15.5)
WBC: 4.8 10*3/uL (ref 4.0–10.5)
nRBC: 0 % (ref 0.0–0.2)

## 2020-03-12 LAB — PHOSPHORUS
Phosphorus: >30 mg/dL — ABNORMAL HIGH (ref 2.5–4.6)
Phosphorus: 8 mg/dL — ABNORMAL HIGH (ref 2.5–4.6)

## 2020-03-12 LAB — MAGNESIUM: Magnesium: 2 mg/dL (ref 1.7–2.4)

## 2020-03-12 MED ORDER — DILTIAZEM HCL ER COATED BEADS 180 MG PO CP24
180.0000 mg | ORAL_CAPSULE | Freq: Every day | ORAL | Status: DC
  Administered 2020-03-12 – 2020-03-13 (×2): 180 mg via ORAL
  Filled 2020-03-12 (×2): qty 1

## 2020-03-12 MED ORDER — HYDRALAZINE HCL 50 MG PO TABS
50.0000 mg | ORAL_TABLET | Freq: Three times a day (TID) | ORAL | Status: DC
  Administered 2020-03-12 – 2020-03-13 (×4): 50 mg via ORAL
  Filled 2020-03-12 (×4): qty 1

## 2020-03-12 MED ORDER — ROPINIROLE HCL 0.25 MG PO TABS
0.2500 mg | ORAL_TABLET | Freq: Once | ORAL | Status: AC
  Administered 2020-03-12: 0.25 mg via ORAL
  Filled 2020-03-12: qty 1
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
## 2020-03-12 NOTE — Progress Notes (Signed)
  [facility] Progress Note   Assessment/ Plan:    Dialyzes [facility] TTS 4 hrs EDW 46.5 F180 BFR 400/ DFR 800 2K/ 2.0 Ca bath Access AVG No heparin Mircera 200 q 2 weeks, last given 02/26/20 Calcitriol 1.0 q rx   Assessment/Plan: 1 Seizure-like activity: EEG without seizure activity.  + UDS for cocaine, per neuro/ primary 2 ESRD: TTS, missed since 5/8.  HD today 5/15.  Next HD 5/17 with resumption of regular TTS sched thereafter 3 Hypertension: improved with UF 4.  Tachycardia/ SVT: s/p Cardizem and bolus 5. Anemia of ESRD: no indication for ESA at this time 6. Metabolic Bone Disease: Calcitriol 1.0 q rx, binders 7.  Nutrition: renal vit with fluid restriction 8.  Dispo: admitted  Subjective:    Had tachycardia last night, resolved with Cardizem, given a full liter bolus.  No complaints this AM.       Objective:   BP (!) 167/72 (BP Location: Right Arm)   Pulse 80   Temp 98.6 F (37 C) (Oral)   Resp 19   Ht $R'5\' 4"'bG$  (1.626 m)   Wt 47.5 kg   SpO2 97%   BMI 17.97 kg/m   Physical Exam: GEN NAD, lying flat in bed HEENT EOMI PERRL  NECK no JVD PULM clear bilaterally no c/w/r CV RRR II/VI systolic murmur with loud S2 ABD soft. nontender NABS EXT no LE edema NEURO AAO x 3 no asterixis ACCESS: LUE AVG + T/B  Labs: BMET Recent Labs  Lab 03/09/20 1615 03/10/20 0733 03/11/20 0300 03/11/20 0459 03/12/20 0205  NA 138  --  138 137 138  K 5.1  --  5.3* 4.0 4.1  CL 95*  --  100 98 101  CO2 18*  --  17* 19* 20*  GLUCOSE 68*  --  104* 103* 82  BUN 45*  --  56* 42* 19  CREATININE 9.83* 10.27* 11.58* 9.03* 6.57*  CALCIUM 8.5*  --  8.8* 8.7* 8.7*  PHOS  --   --  >12.0* 10.3* 8.0*   CBC Recent Labs  Lab 03/09/20 1615 03/09/20 1615 03/10/20 0733 03/11/20 0300 03/11/20 0459 03/12/20 0205  WBC 6.7   < > 6.3 5.0 2.6* 4.8  NEUTROABS 5.3  --   --  3.1  --  2.6  HGB 13.3   < > 12.9 12.9 12.7 12.7  HCT 44.0   < > 41.0 40.7 40.5 40.4  MCV 89.2   < > 87.6 85.1  85.3 86.5  PLT 229   < > 293 240 292 282   < > = values in this interval not displayed.      Medications:    . Chlorhexidine Gluconate Cloth  6 each Topical Q0600  . diltiazem  180 mg Oral Daily  . heparin  5,000 Units Subcutaneous Q8H  . hydrALAZINE  50 mg Oral Q8H     Madelon Lips, MD 03/12/2020, 10:50 AM

## 2020-03-12 NOTE — Progress Notes (Signed)
NEURO HOSPITALIST PROGRESS NOTE   Subjective: Patient awake, alert in bed, NAD. Patient told me today that she remembers doing cocaine and marijuana about 1 week ago. Then stated "  But the cocaine should be out of my system it takes 3 days".  Exam: Vitals:   03/12/20 0337 03/12/20 0704  BP: (!) 148/72 136/74  Pulse: 79 92  Resp: 17 20  Temp: 98.2 F (36.8 C) 98.4 F (36.9 C)  SpO2: 95% 98%    Physical Exam  Constitutional: Appears well-developed and well-nourished.  Psych: Affect appropriate to situation Eyes: Normal external eye and conjunctiva. HENT: Normocephalic, no lesions, without obvious abnormality.   Musculoskeletal-no joint tenderness, deformity or swelling Cardiovascular: Normal rate and regular rhythm.  Respiratory: Effort normal, non-labored breathing saturations WNL on RA GI: Soft.  No distension. There is no tenderness.  Skin: WDI   Neuro:  Mental Status: Alert, oriented, to name, age, month, place, year, thought content appropriate.  Naming and repetition intact.  Attention\concentration was better today. speech fluent without evidence of aphasia.  Able to follow  commands without difficulty. Cranial Nerves: II:  Visual fields grossly normal,  III,IV, VI: ptosis not present, extra-ocular motions intact bilaterally pupils equal, round, reactive to light and accommodation V,VII: smile symmetric, facial light touch sensation normal bilaterally VIII: hearing normal bilaterally IX,X: uvula rises symmetrically XI: bilateral shoulder shrug XII: midline tongue extension Motor: Able to raise all 4 extremities antigravity, asterixis was completely gone today. Tone and bulk:normal tone throughout; no atrophy noted Sensory: Cool temp and light touch light touch intact throughout, bilaterally Deep Tendon Reflexes: 1+ and symmetric biceps and patella Plantars: Right: downgoing   Left: downgoing Cerebellar: No asterixis or ataxia noted.  Gait:  Deferred    Medications:  Scheduled: . Chlorhexidine Gluconate Cloth  6 each Topical Q0600  . diltiazem  60 mg Oral Q8H  . heparin  5,000 Units Subcutaneous Q8H  . hydrALAZINE  25 mg Oral Q6H   Continuous: . sodium chloride    . sodium chloride     SJG:GEZMOQ chloride, sodium chloride, labetalol, lidocaine (PF), lidocaine-prilocaine, pentafluoroprop-tetrafluoroeth  Pertinent Labs/Diagnostics:   MR BRAIN WO CONTRAST  Result Date: 03/10/2020 CLINICAL DATA:  74 year old female with altered mental status. Evaluated in the ED yesterday and subsequent seizure after ED discharge. EXAM: MRI HEAD WITHOUT CONTRAST TECHNIQUE: Multiplanar, multiecho pulse sequences of the brain and surrounding structures were obtained without intravenous contrast. COMPARISON:  Head and face CT yesterday. Brain MRI 10/24/2019. FINDINGS: Brain: Stable cerebral volume. No restricted diffusion to suggest acute infarction. No midline shift, mass effect, evidence of mass lesion, ventriculomegaly, extra-axial collection or acute intracranial hemorrhage. Cervicomedullary junction and pituitary are within normal limits. Study is intermittently degraded by motion artifact despite repeated imaging attempts. Scattered, patchy bilateral cerebral white matter T2 and FLAIR hyperintensity appears stable since December, mild to moderate for age and in a nonspecific configuration. No cortical encephalomalacia or chronic cerebral blood products identified. Mesial temporal lobe structures appear symmetric. No new signal abnormality identified. Signal in the deep gray nuclei, brainstem and cerebellum remains normal. The Vascular: Major intracranial vascular flow voids appear stable from last year. Skull and upper cervical spine: Visible cervical spine degraded by motion today. Grossly stable bone marrow signal. Sinuses/Orbits: Stable and negative. Other: Mastoids remain clear. 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 Otherwise negative visible scalp  and face soft tissues. IMPRESSION: 1. No acute intracranial abnormality. 2. Stable noncontrast MRI appearance of the brain since December with some generalized cerebral volume loss and mild to moderate for age cerebral white matter signal changes, most commonly due to chronic small vessel disease. Electronically Signed   By: Genevie Ann M.D.   On: 03/10/2020 09:52   EEG adult  Result Date: 03/10/2020 Lora Havens, MD     03/10/2020 12:15 PM Patient Name: Bethany [name] MRN: [id number] Epilepsy Attending: Lora Havens Referring Physician/Provider: Dr Bernadette Hoit Date: 03/10/2020 Duration: 23.47 mins Patient history: 74 year old female who presented to [facility] long ED with complaints of strange behavior.  She is also been noted to have several episodes of shaking with loss of consciousness lasting about 25 minutes.  EEG to evaluate for seizures. Level of alertness: Awake, drowsy, sleep, comatose, lethargic AEDs during EEG study: None Technical aspects: This EEG study was done with scalp electrodes positioned according to the 10-20 International system of electrode placement. Electrical activity was acquired at a sampling rate of $Remov'500Hz'ovdwPG$  and reviewed with a high frequency filter of $RemoveB'70Hz'TzEnjaNq$  and a low frequency filter of $RemoveB'1Hz'DEZSvohu$ . EEG data were recorded continuously and digitally stored. Description: No clear posterior dominant rhythm was seen.  EEG showed continuous generalized 3-5 theta-delta slowing.  Intermittent triphasic waves, generalized, maximal bifrontal were also noted.  Hyperventilation and photic stimulation were not performed.   ABNORMALITY -Continuous slow, generalized -Triphasic waves, generalized IMPRESSION: This study is suggestive of moderate diffuse encephalopathy, nonspecific etiology but likely related to  toxic-metabolic etiology. No seizures or definite epileptiform discharges were seen throughout the recording. Lora Havens     ECHOCARDIOGRAM COMPLETE  Result Date: 03/10/2020    ECHOCARDIOGRAM REPORT   Patient Name:   Bethany [name] Date of Exam: 03/10/2020 Medical Rec #:  [id number]       Height:       64.0 in Accession #:    [id number]      Weight:       108.0 lb Date of Birth:  Apr 12, 1946       BSA:          1.506 m Patient Age:    74 years        BP:           146/85 mmHg Patient Gender: F               HR:           97 bpm. Exam Location:  Inpatient Procedure: 2D Echo Indications:    cocaine abuse  History:        Patient has no prior history of Echocardiogram examinations. End                 stage renal disease; Risk Factors:Current Smoker and                 Hypertension.  Sonographer:    Johny Chess Referring Phys: [id number] [place]  1. Left ventricular ejection fraction, by estimation, is 55 to 60%. The left ventricle has normal function. The left ventricle has no regional wall motion abnormalities. Left ventricular diastolic parameters are consistent with Grade II diastolic dysfunction (pseudonormalization). Elevated left ventricular end-diastolic pressure.  2. Right ventricular systolic function is normal. The right ventricular size is normal. There is normal pulmonary artery systolic pressure.  3. Left atrial size was mildly dilated.  4. The mitral valve is normal in structure. No evidence of mitral  valve regurgitation. No evidence of mitral stenosis.  5. The aortic valve has an indeterminant number of cusps. Aortic valve regurgitation is not visualized. No aortic stenosis is present.  6. The inferior vena cava is normal in size with greater than 50% respiratory variability, suggesting right atrial pressure of 3 mmHg. FINDINGS  Left Ventricle: Left ventricular ejection fraction, by estimation, is 55 to 60%. The left ventricle has normal function. The left ventricle has no regional wall motion abnormalities. The left ventricular internal cavity size was normal in size. There is  no left ventricular  hypertrophy. Left ventricular diastolic parameters are consistent with Grade II diastolic dysfunction (pseudonormalization). Elevated left ventricular end-diastolic pressure. Right Ventricle: The right ventricular size is normal. No increase in right ventricular wall thickness. Right ventricular systolic function is normal. There is normal pulmonary artery systolic pressure. The tricuspid regurgitant velocity is 0.22 m/s, and  with an assumed right atrial pressure of 3 mmHg, the estimated right ventricular systolic pressure is 3.2 mmHg. Left Atrium: Left atrial size was mildly dilated. Right Atrium: Right atrial size was normal in size. Pericardium: There is no evidence of pericardial effusion. Mitral Valve: The mitral valve is normal in structure. Normal mobility of the mitral valve leaflets. Moderate mitral annular calcification. No evidence of mitral valve regurgitation. No evidence of mitral valve stenosis. Tricuspid Valve: The tricuspid valve is normal in structure. Tricuspid valve regurgitation is trivial. No evidence of tricuspid stenosis. Aortic Valve: The aortic valve has an indeterminant number of cusps. . There is moderate thickening and severe calcifcation of the aortic valve. Aortic valve regurgitation is not visualized. No aortic stenosis is present. There is moderate thickening of the aortic valve. There is severe calcifcation of the aortic valve. Pulmonic Valve: The pulmonic valve was normal in structure. Pulmonic valve regurgitation is not visualized. No evidence of pulmonic stenosis. Aorta: The aortic root is normal in size and structure. Venous: The inferior vena cava is normal in size with greater than 50% respiratory variability, suggesting right atrial pressure of 3 mmHg. IAS/Shunts: There is right bowing of the interatrial septum, suggestive of elevated left atrial pressure. There is redundancy of the interatrial septum. No atrial level shunt detected by color flow Doppler.  LEFT VENTRICLE  PLAX 2D LVIDd:         4.30 cm  Diastology LVIDs:         3.10 cm  LV e' lateral:   7.83 cm/s LV PW:         1.10 cm  LV E/e' lateral: 15.8 LV IVS:        0.90 cm  LV e' medial:    6.20 cm/s LVOT diam:     1.70 cm  LV E/e' medial:  20.0 LV SV:         63 LV SV Index:   42 LVOT Area:     2.27 cm  RIGHT VENTRICLE TAPSE (M-mode): 2.2 cm LEFT ATRIUM             Index       RIGHT ATRIUM           Index LA diam:        3.20 cm 2.13 cm/m  RA Area:     13.10 cm LA Vol (A2C):   42.3 ml 28.09 ml/m RA Volume:   28.70 ml  19.06 ml/m LA Vol (A4C):   58.5 ml 38.85 ml/m LA Biplane Vol: 52.3 ml 34.74 ml/m  AORTIC VALVE LVOT Vmax:   189.00 cm/s  LVOT Vmean:  98.800 cm/s LVOT VTI:    0.277 m  AORTA Ao Root diam: 2.70 cm MITRAL VALVE                TRICUSPID VALVE MV Area (PHT): 3.21 cm     TR Peak grad:   0.2 mmHg MV Decel Time: 236 msec     TR Vmax:        22.10 cm/s MV E velocity: 124.00 cm/s MV A velocity: 151.00 cm/s  SHUNTS MV E/A ratio:  0.82         Systemic VTI:  0.28 m                             Systemic Diam: 1.70 cm Fransico Him MD Electronically signed by Fransico Him MD Signature Date/Time: 03/10/2020/6:49:11 PM    Final    Assessment:   74 y.o. female  with PMH tobaccos abuse, HTN, HLD, ESRD (on dialysis), COPD who presented to [facility] ED with c/o "strange behavior". MRI did not show anything acute. Generalized cerebral volume loss. UDS was + cocaine.  EEG done yesterday did not show any seizures or definite epileptiform discharges.   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 Neurology to sign off, no further recommendations at this time.   Laurey Morale, MSN, NP-C [facility] [phone]  03/12/2020, 9:41 AM

## 2020-03-13 LAB — GLUCOSE, CAPILLARY: Glucose-Capillary: 107 mg/dL — ABNORMAL HIGH (ref 70–99)

## 2020-03-13 LAB — COMPREHENSIVE METABOLIC PANEL
ALT: 15 U/L (ref 0–44)
AST: 26 U/L (ref 15–41)
Albumin: 2.9 g/dL — ABNORMAL LOW (ref 3.5–5.0)
Alkaline Phosphatase: 61 U/L (ref 38–126)
Anion gap: 16 — ABNORMAL HIGH (ref 5–15)
BUN: 26 mg/dL — ABNORMAL HIGH (ref 8–23)
CO2: 20 mmol/L — ABNORMAL LOW (ref 22–32)
Calcium: 8.7 mg/dL — ABNORMAL LOW (ref 8.9–10.3)
Chloride: 100 mmol/L (ref 98–111)
Creatinine, Ser: 7.79 mg/dL — ABNORMAL HIGH (ref 0.44–1.00)
GFR calc Af Amer: 5 mL/min — ABNORMAL LOW (ref >60)
GFR calc non Af Amer: 5 mL/min — ABNORMAL LOW (ref >60)
Glucose, Bld: 96 mg/dL (ref 70–99)
Potassium: 4.2 mmol/L (ref 3.5–5.1)
Sodium: 136 mmol/L (ref 135–145)
Total Bilirubin: 0.7 mg/dL (ref 0.3–1.2)
Total Protein: 6.1 g/dL — ABNORMAL LOW (ref 6.5–8.1)

## 2020-03-13 LAB — CBC WITH DIFFERENTIAL/PLATELET
Abs Immature Granulocytes: 0.03 10*3/uL (ref 0.00–0.07)
Basophils Absolute: 0 10*3/uL (ref 0.0–0.1)
Basophils Relative: 1 %
Eosinophils Absolute: 0.1 10*3/uL (ref 0.0–0.5)
Eosinophils Relative: 2 %
HCT: 43 % (ref 36.0–46.0)
Hemoglobin: 14 g/dL (ref 12.0–15.0)
Immature Granulocytes: 1 %
Lymphocytes Relative: 27 %
Lymphs Abs: 1.3 10*3/uL (ref 0.7–4.0)
MCH: 27.5 pg (ref 26.0–34.0)
MCHC: 32.6 g/dL (ref 30.0–36.0)
MCV: 84.5 fL (ref 80.0–100.0)
Monocytes Absolute: 0.9 10*3/uL (ref 0.1–1.0)
Monocytes Relative: 19 %
Neutro Abs: 2.5 10*3/uL (ref 1.7–7.7)
Neutrophils Relative %: 50 %
Platelets: 302 10*3/uL (ref 150–400)
RBC: 5.09 MIL/uL (ref 3.87–5.11)
RDW: 18.1 % — ABNORMAL HIGH (ref 11.5–15.5)
WBC: 4.8 10*3/uL (ref 4.0–10.5)
nRBC: 0 % (ref 0.0–0.2)

## 2020-03-13 LAB — MAGNESIUM: Magnesium: 2.2 mg/dL (ref 1.7–2.4)

## 2020-03-13 LAB — PHOSPHORUS: Phosphorus: 8.2 mg/dL — ABNORMAL HIGH (ref 2.5–4.6)

## 2020-03-13 MED ORDER — CHLORHEXIDINE GLUCONATE CLOTH 2 % EX PADS: 6.0000 | MEDICATED_PAD | Freq: Every day | CUTANEOUS | Status: DC

## 2020-03-13 MED ORDER — DILTIAZEM HCL ER COATED BEADS 180 MG PO CP24: 180.0000 mg | ORAL_CAPSULE | Freq: Every day | ORAL | 0 refills | Status: DC

## 2020-03-13 MED ORDER — HYDRALAZINE HCL 50 MG PO TABS: 50.0000 mg | ORAL_TABLET | Freq: Three times a day (TID) | ORAL | 2 refills | Status: DC
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
## 2020-03-13 NOTE — Progress Notes (Signed)
Back from HD by bed awake and alert. °

## 2020-03-13 NOTE — Discharge Summary (Signed)
Physician Discharge Summary  Bethany [name] KZS:[id number] DOB: Mar 26, 1946 DOA: 03/10/2020  PCP: System, Pcp Not In  Admit date: 03/10/2020 Discharge date: 03/13/2020  Admitted From: Home Disposition: Home  Recommendations for Outpatient Follow-up:  1. Follow up with PCP in 1-2 weeks 2. Follow-up with nephrology as scheduled 3. Started on Cardizem CD 180 mg p.o. daily and hydralazine 50 mg p.o. every 8 hours for heart rate and blood pressure control 4. Continue to monitor blood pressure closely following hospitalization  Home Health: No Equipment/Devices: None  Discharge Condition: Stable CODE STATUS: Full code Diet recommendation: Renal diet with 1200 mL fluid restriction  History of present illness:  Bethany Baxteris a 74 year old female with past medical history remarkable for ESRD on HD TTS, anemia of chronic kidney disease, COPD, HTN, tobacco abuse disorder, substance abuse who initially presented to [facility] M[facility] with AMS. Negative CT at that time he was discharged in which he experienced a "seizure-like activity" with loss of consciousness and struck her head. She then proceeded to [facility] long ED for evaluation. Neurology was consulted and patient underwent MRI brain with no acute findings. EEG showed moderate diffuse encephalopathy that is nonspecific with no seizures or epileptiform discharges. Patient with history of noncompliance and has missed several dialysis sessions;last session being 03/05/19/2021. Patient also noted to have poorly controlled blood pressures with SBP's in the 200s and was started on a nitroglycerin drip prior to transfer to [facility] this afternoon.  Hospital course:  Acute metabolic encephalopathy Patient presenting to [facility] long ED with seizure-like activity and confusion. Likely multifactorial given positive cocaine on UDS and noncompliance with hemodialysis with elevated BUN of 45. No infectious etiology appreciated as patient  is afebrile without leukocytosis. CT head with no acute intracranial process but did note mild/moderate volume loss.MR brain and EEG unrevealing. Neurology following, believes her symptoms related to uremia from missed HD and likely exacerbated by her substance abuse with cocaine and marijuana.  Neurology now signed off with no further recommendations other than cessation of substance abuse and compliance with hemodialysis. --Supportive care and treatment as depicted below  Hypertensive emergency: resolved SBP is up to the 220s. Patient denies any history of antihypertensive use. Likely factor of volume overload given the missed dialysis sessions.  Initially started on a nitroglycerin drip which was titrated off.  Patient was started on Cardizem CD 180 mg p.o. daily and hydralazine 50 g p.o. 3 times daily with good control.  Avoiding beta-blockers with history of active cocaine use.  Sinus tachycardia/SVT: resolved Following HD today, patient's heart rate noted to be elevated up to the 160s.  EKG notable for sinus tachycardia versus SVT.  Etiology likely secondary to volume depletion following HD today.  Patient was given 1 L NS bolus and started on Cardizem.  Cardizem was uptitrated 280 mg p.o. daily.  Avoiding beta blockade with history of cocaine abuse.   ESRD on HD Dialysis at [facility] via AV graft left upper extremity TTS. Last HD 5/8.  History of noncompliance with HD.  Nephrology was consulted and followed during hospital course.  Patient dialyzed on several occasions during her inpatient hospitalization.  Will resume HD on her normal TTS schedule starting on 03/14/2020.  Polysubstance abuse UDS positive for cocaine. Continues with tobacco use disorder. Counseled on need for complete cessation of illicit drugs as well as tobacco use.  Weakness, debility: Seen by PT/OT during hospitalization without any recommendations.  Discharge Diagnoses:  Active Problems:   ESRD (end stage renal  disease) (  [place])   Smoking   Acute delirium   Witnessed seizure-like activity (HCC)   Hypoglycemia   Lactic acidosis   Elevated brain natriuretic peptide (BNP) level   Drug abuse ([place])   Essential hypertension   Acute metabolic encephalopathy    Discharge Instructions  Discharge Instructions    Call MD for:  difficulty breathing, headache or visual disturbances   Complete by: As directed    Call MD for:  extreme fatigue   Complete by: As directed    Call MD for:  persistant dizziness or light-headedness   Complete by: As directed    Call MD for:  persistant nausea and vomiting   Complete by: As directed    Call MD for:  severe uncontrolled pain   Complete by: As directed    Call MD for:  temperature >100.4   Complete by: As directed    Diet - low sodium heart healthy   Complete by: As directed    Increase activity slowly   Complete by: As directed      Allergies as of 03/13/2020   No Known Allergies     Medication List    STOP taking these medications   atenolol 50 MG tablet Commonly known as: TENORMIN     TAKE these medications   diltiazem 180 MG 24 hr capsule Commonly known as: CARDIZEM CD Take 1 capsule (180 mg total) by mouth daily. Start taking on: Mar 14, 2020   hydrALAZINE 50 MG tablet Commonly known as: APRESOLINE Take 1 tablet (50 mg total) by mouth every 8 (eight) hours.       No Known Allergies  Consultations:  Nephrology   Procedures/Studies: CT HEAD WO CONTRAST  Result Date: 03/09/2020 CLINICAL DATA:  Altered mental status, facial trauma EXAM: CT HEAD WITHOUT CONTRAST CT MAXILLOFACIAL WITHOUT CONTRAST TECHNIQUE: Multidetector CT imaging of the head and maxillofacial structures were performed using the standard protocol without intravenous contrast. Multiplanar CT image reconstructions of the maxillofacial structures were also generated. COMPARISON:  MRI 10/24/2019, CT 10/24/2019 FINDINGS: CT HEAD FINDINGS Brain: No evidence of acute  infarction, hemorrhage, hydrocephalus, extra-axial collection or mass lesion/mass effect. Symmetric prominence of the ventricles, cisterns and sulci compatible with parenchymal volume loss. Patchy areas of white matter hypoattenuation are most compatible with chronic microvascular angiopathy. Vascular: Atherosclerotic calcification of the carotid siphons and intradural vertebral arteries. No hyperdense vessel. Skull: No calvarial fracture or suspicious osseous lesion. No scalp swelling or hematoma. Other: None CT MAXILLOFACIAL FINDINGS Osseous: No fracture of the bony orbits. Mild bilateral deformity of the nasal bones without overlying swelling favoring remote finding. No other mid face fractures are seen. The pterygoid plates are intact. The mandible is intact. 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 No temporal bone fractures are identified. Patient is edentulous. Orbits: No retro septal gas, stranding or hemorrhage. The globes appear normal and symmetric. Symmetric appearance of the extraocular musculature and optic nerve sheath complexes. Normal caliber of the superior ophthalmic veins. Sinuses: Paranasal sinuses are predominantly clear. Slight rightward nasal septal deviation with a right-sided nasal septal spur. Mastoid air cells are well aerated. Middle ear cavities are clear. Ossicular chains appear normally configured. Pneumatization of the petrous apices. Soft tissues: Mild left supraorbital soft tissue thickening. No large hematoma, soft tissue gas or foreign body. Cervical carotid and vertebral artery atherosclerosis. Limited cervical: Craniocervical atlantoaxial articulations are normally aligned. Reversal the normal cervical lordosis. No traumatic listhesis. No visible cervical spine fracture.  Multilevel cervical spondylitic changes are seen throughout the included portions of the cervical spine most pronounced at the C5-C7 levels. Bony  fusion of the C4-5 articular facets is noted. No abnormal facet widening, perched or jumped facets. Small degenerative os odontoideum. IMPRESSION: 1. No acute intracranial abnormality. 2. Mild to moderate parenchymal volume loss and chronic microvascular angiopathy changes. 3. Mild left supraorbital soft tissue thickening without large hematoma, soft tissue gas or foreign body. 4. Mild bilateral deformity of the nasal bones without overlying swelling favoring remote finding. Correlate for point tenderness. 5. Multilevel cervical spondylitic changes most pronounced at the C5-C7 levels or sclerosis is favored to be degenerative in the absence of infectious symptoms. 6. Cervical and vertebral artery atherosclerosis. 7. 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 Electronically Signed   By: Lovena Le M.D.   On: 03/09/2020 19:08   MR BRAIN WO CONTRAST  Result Date: 03/10/2020 CLINICAL DATA:  74 year old female with altered mental status. Evaluated in the ED yesterday and subsequent seizure after ED discharge. EXAM: MRI HEAD WITHOUT CONTRAST TECHNIQUE: Multiplanar, multiecho pulse sequences of the brain and surrounding structures were obtained without intravenous contrast. COMPARISON:  Head and face CT yesterday. Brain MRI 10/24/2019. FINDINGS: Brain: Stable cerebral volume. No restricted diffusion to suggest acute infarction. No midline shift, mass effect, evidence of mass lesion, ventriculomegaly, extra-axial collection or acute intracranial hemorrhage. Cervicomedullary junction and pituitary are within normal limits. Study is intermittently degraded by motion artifact despite repeated imaging attempts. Scattered, patchy bilateral cerebral white matter T2 and FLAIR hyperintensity appears stable since December, mild to moderate for age and in a nonspecific configuration. No cortical encephalomalacia or chronic cerebral blood products identified. Mesial temporal lobe  structures appear symmetric. No new signal abnormality identified. Signal in the deep gray nuclei, brainstem and cerebellum remains normal. The Vascular: Major intracranial vascular flow voids appear stable from last year. Skull and upper cervical spine: Visible cervical spine degraded by motion today. Grossly stable bone marrow signal. Sinuses/Orbits: Stable and negative. Other: Mastoids remain clear. 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 Otherwise negative visible scalp and face soft tissues. IMPRESSION: 1. No acute intracranial abnormality. 2. Stable noncontrast MRI appearance of the brain since December with some generalized cerebral volume loss and mild to moderate for age cerebral white matter signal changes, most commonly due to chronic small vessel disease. Electronically Signed   By: Genevie Ann M.D.   On: 03/10/2020 09:52   DG Chest Port 1 View  Result Date: 03/09/2020 CLINICAL DATA:  Altered mental status with chest pain, initial encounter EXAM: PORTABLE CHEST 1 VIEW COMPARISON:  12/24/2019 FINDINGS: Cardiac shadow is enlarged but stable. Aortic calcifications are again seen. Chronic interstitial changes are noted improved from the prior exam and likely representing an underlying chronic fibrotic component. No definitive edema is seen. No focal infiltrate is noted. No acute bony abnormality is seen. IMPRESSION: Chronic appearing interstitial changes without superimposed acute abnormality. Electronically Signed   By: Inez Catalina M.D.   On: 03/09/2020 16:33   EEG adult  Result Date: 03/10/2020 Lora Havens, MD     03/10/2020 12:15 PM Patient Name: Bethany [name] MRN: [id number] Epilepsy Attending: Lora Havens Referring Physician/Provider: Dr Bernadette Hoit Date: 03/10/2020 Duration: 23.47 mins Patient history: 74 year old female who presented to [facility] long ED with complaints of strange behavior.  She is also been noted to have several  episodes of shaking with loss of consciousness lasting  about 25 minutes.  EEG to evaluate for seizures. Level of alertness: Awake, drowsy, sleep, comatose, lethargic AEDs during EEG study: None Technical aspects: This EEG study was done with scalp electrodes positioned according to the 10-20 International system of electrode placement. Electrical activity was acquired at a sampling rate of $Remov'500Hz'nVYtjA$  and reviewed with a high frequency filter of $RemoveB'70Hz'NYSbiTnA$  and a low frequency filter of $RemoveB'1Hz'WCHLKVif$ . EEG data were recorded continuously and digitally stored. Description: No clear posterior dominant rhythm was seen.  EEG showed continuous generalized 3-5 theta-delta slowing.  Intermittent triphasic waves, generalized, maximal bifrontal were also noted.  Hyperventilation and photic stimulation were not performed.   ABNORMALITY -Continuous slow, generalized -Triphasic waves, generalized IMPRESSION: This study is suggestive of moderate diffuse encephalopathy, nonspecific etiology but likely related to  toxic-metabolic etiology. No seizures or definite epileptiform discharges were seen throughout the recording. Lora Havens    ECHOCARDIOGRAM COMPLETE  Result Date: 03/10/2020    ECHOCARDIOGRAM REPORT   Patient Name:   Bethany [name] Date of Exam: 03/10/2020 Medical Rec #:  [id number]       Height:       64.0 in Accession #:    [id number]      Weight:       108.0 lb Date of Birth:  Aug 27, 1946       BSA:          1.506 m Patient Age:    29 years        BP:           146/85 mmHg Patient Gender: F               HR:           97 bpm. Exam Location:  Inpatient Procedure: 2D Echo Indications:    cocaine abuse  History:        Patient has no prior history of Echocardiogram examinations. End                 stage renal disease; Risk Factors:Current Smoker and                 Hypertension.  Sonographer:    Johny Chess Referring Phys: [id number] [place]  1. Left ventricular ejection fraction, by estimation, is 55 to 60%. The  left ventricle has normal function. The left ventricle has no regional wall motion abnormalities. Left ventricular diastolic parameters are consistent with Grade II diastolic dysfunction (pseudonormalization). Elevated left ventricular end-diastolic pressure.  2. Right ventricular systolic function is normal. The right ventricular size is normal. There is normal pulmonary artery systolic pressure.  3. Left atrial size was mildly dilated.  4. The mitral valve is normal in structure. No evidence of mitral valve regurgitation. No evidence of mitral stenosis.  5. The aortic valve has an indeterminant number of cusps. Aortic valve regurgitation is not visualized. No aortic stenosis is present.  6. The inferior vena cava is normal in size with greater than 50% respiratory variability, suggesting right atrial pressure of 3 mmHg. FINDINGS  Left Ventricle: Left ventricular ejection fraction, by estimation, is 55 to 60%. The left ventricle has normal function. The left ventricle has no regional wall motion abnormalities. The left ventricular internal cavity size was normal in size. There is  no left ventricular hypertrophy. Left ventricular diastolic parameters are consistent with Grade II diastolic dysfunction (pseudonormalization). Elevated left ventricular end-diastolic pressure. Right Ventricle: The right ventricular size is normal. No increase in right ventricular wall thickness. Right  ventricular systolic function is normal. There is normal pulmonary artery systolic pressure. The tricuspid regurgitant velocity is 0.22 m/s, and  with an assumed right atrial pressure of 3 mmHg, the estimated right ventricular systolic pressure is 3.2 mmHg. Left Atrium: Left atrial size was mildly dilated. Right Atrium: Right atrial size was normal in size. Pericardium: There is no evidence of pericardial effusion. Mitral Valve: The mitral valve is normal in structure. Normal mobility of the mitral valve leaflets. Moderate mitral annular  calcification. No evidence of mitral valve regurgitation. No evidence of mitral valve stenosis. Tricuspid Valve: The tricuspid valve is normal in structure. Tricuspid valve regurgitation is trivial. No evidence of tricuspid stenosis. Aortic Valve: The aortic valve has an indeterminant number of cusps. . There is moderate thickening and severe calcifcation of the aortic valve. Aortic valve regurgitation is not visualized. No aortic stenosis is present. There is moderate thickening of the aortic valve. There is severe calcifcation of the aortic valve. Pulmonic Valve: The pulmonic valve was normal in structure. Pulmonic valve regurgitation is not visualized. No evidence of pulmonic stenosis. Aorta: The aortic root is normal in size and structure. Venous: The inferior vena cava is normal in size with greater than 50% respiratory variability, suggesting right atrial pressure of 3 mmHg. IAS/Shunts: There is right bowing of the interatrial septum, suggestive of elevated left atrial pressure. There is redundancy of the interatrial septum. No atrial level shunt detected by color flow Doppler.  LEFT VENTRICLE PLAX 2D LVIDd:         4.30 cm  Diastology LVIDs:         3.10 cm  LV e' lateral:   7.83 cm/s LV PW:         1.10 cm  LV E/e' lateral: 15.8 LV IVS:        0.90 cm  LV e' medial:    6.20 cm/s LVOT diam:     1.70 cm  LV E/e' medial:  20.0 LV SV:         63 LV SV Index:   42 LVOT Area:     2.27 cm  RIGHT VENTRICLE TAPSE (M-mode): 2.2 cm LEFT ATRIUM             Index       RIGHT ATRIUM           Index LA diam:        3.20 cm 2.13 cm/m  RA Area:     13.10 cm LA Vol (A2C):   42.3 ml 28.09 ml/m RA Volume:   28.70 ml  19.06 ml/m LA Vol (A4C):   58.5 ml 38.85 ml/m LA Biplane Vol: 52.3 ml 34.74 ml/m  AORTIC VALVE LVOT Vmax:   189.00 cm/s LVOT Vmean:  98.800 cm/s LVOT VTI:    0.277 m  AORTA Ao Root diam: 2.70 cm MITRAL VALVE                TRICUSPID VALVE MV Area (PHT): 3.21 cm     TR Peak grad:   0.2 mmHg MV Decel Time:  236 msec     TR Vmax:        22.10 cm/s MV E velocity: 124.00 cm/s MV A velocity: 151.00 cm/s  SHUNTS MV E/A ratio:  0.82         Systemic VTI:  0.28 m  Systemic Diam: 1.70 cm Armanda Magic MD Electronically signed by Armanda Magic MD Signature Date/Time: 03/10/2020/6:49:11 PM    Final    CT Maxillofacial WO CM  Result Date: 03/09/2020 CLINICAL DATA:  Altered mental status, facial trauma EXAM: CT HEAD WITHOUT CONTRAST CT MAXILLOFACIAL WITHOUT CONTRAST TECHNIQUE: Multidetector CT imaging of the head and maxillofacial structures were performed using the standard protocol without intravenous contrast. Multiplanar CT image reconstructions of the maxillofacial structures were also generated. COMPARISON:  MRI 10/24/2019, CT 10/24/2019 FINDINGS: CT HEAD FINDINGS Brain: No evidence of acute infarction, hemorrhage, hydrocephalus, extra-axial collection or mass lesion/mass effect. Symmetric prominence of the ventricles, cisterns and sulci compatible with parenchymal volume loss. Patchy areas of white matter hypoattenuation are most compatible with chronic microvascular angiopathy. Vascular: Atherosclerotic calcification of the carotid siphons and intradural vertebral arteries. No hyperdense vessel. Skull: No calvarial fracture or suspicious osseous lesion. No scalp swelling or hematoma. Other: None CT MAXILLOFACIAL FINDINGS Osseous: No fracture of the bony orbits. Mild bilateral deformity of the nasal bones without overlying swelling favoring remote finding. No other mid face fractures are seen. The pterygoid plates are intact. The mandible is intact. 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 No temporal bone fractures are identified. Patient is edentulous. Orbits: No retro septal gas, stranding or hemorrhage. The globes appear normal and symmetric. Symmetric appearance of the extraocular musculature and optic nerve sheath complexes. Normal  caliber of the superior ophthalmic veins. Sinuses: Paranasal sinuses are predominantly clear. Slight rightward nasal septal deviation with a right-sided nasal septal spur. Mastoid air cells are well aerated. Middle ear cavities are clear. Ossicular chains appear normally configured. Pneumatization of the petrous apices. Soft tissues: Mild left supraorbital soft tissue thickening. No large hematoma, soft tissue gas or foreign body. Cervical carotid and vertebral artery atherosclerosis. Limited cervical: Craniocervical atlantoaxial articulations are normally aligned. Reversal the normal cervical lordosis. No traumatic listhesis. No visible cervical spine fracture. Multilevel cervical spondylitic changes are seen throughout the included portions of the cervical spine most pronounced at the C5-C7 levels. Bony fusion of the C4-5 articular facets is noted. No abnormal facet widening, perched or jumped facets. Small degenerative os odontoideum. IMPRESSION: 1. No acute intracranial abnormality. 2. Mild to moderate parenchymal volume loss and chronic microvascular angiopathy changes. 3. Mild left supraorbital soft tissue thickening without large hematoma, soft tissue gas or foreign body. 4. Mild bilateral deformity of the nasal bones without overlying swelling favoring remote finding. Correlate for point tenderness. 5. Multilevel cervical spondylitic changes most pronounced at the C5-C7 levels or sclerosis is favored to be degenerative in the absence of infectious symptoms. 6. Cervical and vertebral artery atherosclerosis. 7. 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 Electronically Signed   By: Kreg Shropshire M.D.   On: 03/09/2020 19:08      Subjective: Patient seen and examined bedside, resting comfortably.  Seen by PT/OT this afternoon with no recommendations.  Blood pressure and heart rate continues to be well controlled.  Received HD today.  Plan to discharge home  today with outpatient follow-up with her home HD clinic starting tomorrow.  No other questions or concerns at this time.  Denies headache, no fever/chills/night sweats, no nausea/vomiting/diarrhea, no chest pain, no palpitations, no shortness of breath, no abdominal pain.  No acute events overnight per nursing staff.  Discharge Exam: Vitals:   03/13/20 1258 03/13/20 1346  BP:  (!) 124/106  Pulse: 93  Resp:    Temp:    SpO2: 92%    Vitals:   03/13/20 1100 03/13/20 1130 03/13/20 1258 03/13/20 1346  BP: 108/65 (!) 98/49  (!) 124/106  Pulse: 87 85 93   Resp: 18 18    Temp:      TempSrc:      SpO2:   92%   Weight:      Height:        General: Pt is alert, awake, not in acute distress, thin in appearance Cardiovascular: RRR, S1/S2 +, no rubs, no gallops Respiratory: CTA bilaterally, no wheezing, no rhonchi Abdominal: Soft, NT, ND, bowel sounds + Extremities: no edema, no cyanosis    The results of significant diagnostics from this hospitalization (including imaging, microbiology, ancillary and laboratory) are listed below for reference.     Microbiology: Recent Results (from the past 240 hour(s))  SARS Coronavirus 2 by RT PCR (hospital order, performed in [facility] hospital lab) Nasopharyngeal Nasopharyngeal Swab     Status: None   Collection Time: 03/10/20  5:34 AM   Specimen: Nasopharyngeal Swab  Result Value Ref Range Status   SARS Coronavirus 2 NEGATIVE NEGATIVE Final    Comment: (NOTE) SARS-CoV-2 target nucleic acids are NOT DETECTED. The SARS-CoV-2 RNA is generally detectable in upper and lower respiratory specimens during the acute phase of infection. The lowest concentration of SARS-CoV-2 viral copies this assay can detect is 250 copies / mL. A negative result does not preclude SARS-CoV-2 infection and should not be used as the sole basis for treatment or other patient management decisions.  A negative result may occur with improper specimen collection /  handling, submission of specimen other than nasopharyngeal swab, presence of viral mutation(s) within the areas targeted by this assay, and inadequate number of viral copies (<250 copies / mL). A negative result must be combined with clinical observations, patient history, and epidemiological information. Fact Sheet for Patients:   StrictlyIdeas.no Fact Sheet for Healthcare Providers: BankingDealers.co.za This test is not yet approved or cleared  by the [place] FDA and has been authorized for detection and/or diagnosis of SARS-CoV-2 by FDA under an Emergency Use Authorization (EUA).  This EUA will remain in effect (meaning this test can be used) for the duration of the COVID-19 declaration under Section 564(b)(1) of the Act, 21 U.S.C. section 360bbb-3(b)(1), unless the authorization is terminated or revoked sooner. Performed at [facility], [place] [address], [place], [place] [address]   MRSA PCR Screening     Status: None   Collection Time: 03/10/20  5:30 PM   Specimen: Nasal Mucosa; Nasopharyngeal  Result Value Ref Range Status   MRSA by PCR NEGATIVE NEGATIVE Final    Comment:        The GeneXpert MRSA Assay (FDA approved for NASAL specimens only), is one component of a comprehensive MRSA colonization surveillance program. It is not intended to diagnose MRSA infection nor to guide or monitor treatment for MRSA infections. Performed at [facility], [place] [address], [place], [place] [address]      Labs: BNP (last 3 results) Recent Labs    12/24/19 0858 03/09/20 1616  BNP >4,500.0* >2,993.7*   Basic Metabolic Panel: Recent Labs  Lab 03/09/20 1615 03/09/20 1615 03/10/20 0733 03/11/20 0300 03/11/20 0459 03/12/20 0205 03/13/20 0221  NA 138  --   --  138 137 138 136  K 5.1  --   --  5.3* 4.0 4.1 4.2  CL 95*  --   --  100 98 101 100  CO2 18*  --   --  17* 19* 20* 20*  GLUCOSE 68*  --   --   104* 103* 82 96  BUN 45*  --   --  56* 42* 19 26*  CREATININE 9.83*   < > 10.27* 11.58* 9.03* 6.57* 7.79*  CALCIUM 8.5*  --   --  8.8* 8.7* 8.7* 8.7*  MG  --   --   --  2.6*  --  2.0 2.2  PHOS  --   --   --  >30.0* 10.3* 8.0* 8.2*   < > = values in this interval not displayed.   Liver Function Tests: Recent Labs  Lab 03/09/20 1615 03/11/20 0300 03/11/20 0459 03/12/20 0205 03/13/20 0221  AST 39 37  --  26 26  ALT 16 15  --  15 15  ALKPHOS 73 61  --  61 61  BILITOT 1.4* 1.3*  --  0.9 0.7  PROT 7.1 5.9*  --  6.0* 6.1*  ALBUMIN 3.6 2.8* 2.8* 2.7* 2.9*   No results for input(s): LIPASE, AMYLASE in the last 168 hours. No results for input(s): AMMONIA in the last 168 hours. CBC: Recent Labs  Lab 03/09/20 1615 03/09/20 1615 03/10/20 0733 03/11/20 0300 03/11/20 0459 03/12/20 0205 03/13/20 0221  WBC 6.7   < > 6.3 5.0 2.6* 4.8 4.8  NEUTROABS 5.3  --   --  3.1  --  2.6 2.5  HGB 13.3   < > 12.9 12.9 12.7 12.7 14.0  HCT 44.0   < > 41.0 40.7 40.5 40.4 43.0  MCV 89.2   < > 87.6 85.1 85.3 86.5 84.5  PLT 229   < > 293 240 292 282 302   < > = values in this interval not displayed.   Cardiac Enzymes: No results for input(s): CKTOTAL, CKMB, CKMBINDEX, TROPONINI in the last 168 hours. BNP: Invalid input(s): POCBNP CBG: Recent Labs  Lab 03/12/20 0615 03/12/20 1124 03/12/20 1609 03/12/20 2100 03/13/20 0614  GLUCAP 82 94 107* 108* 107*   D-Dimer No results for input(s): DDIMER in the last 72 hours. Hgb A1c No results for input(s): HGBA1C in the last 72 hours. Lipid Profile No results for input(s): CHOL, HDL, LDLCALC, TRIG, CHOLHDL, LDLDIRECT in the last 72 hours. Thyroid function studies No results for input(s): TSH, T4TOTAL, T3FREE, THYROIDAB in the last 72 hours.  Invalid input(s): FREET3 Anemia work up No results for input(s): VITAMINB12, FOLATE, FERRITIN, TIBC, IRON, RETICCTPCT in the last 72 hours. Urinalysis No results found for: COLORURINE, APPEARANCEUR, [place],  [place], [place], [place], [place], [place], PROTEINUR, UROBILINOGEN, NITRITE, LEUKOCYTESUR Sepsis Labs Invalid input(s): PROCALCITONIN,  WBC,  LACTICIDVEN Microbiology Recent Results (from the past 240 hour(s))  SARS Coronavirus 2 by RT PCR (hospital order, performed in [facility] hospital lab) Nasopharyngeal Nasopharyngeal Swab     Status: None   Collection Time: 03/10/20  5:34 AM   Specimen: Nasopharyngeal Swab  Result Value Ref Range Status   SARS Coronavirus 2 NEGATIVE NEGATIVE Final    Comment: (NOTE) SARS-CoV-2 target nucleic acids are NOT DETECTED. The SARS-CoV-2 RNA is generally detectable in upper and lower respiratory specimens during the acute phase of infection. The lowest concentration of SARS-CoV-2 viral copies this assay can detect is 250 copies / mL. A negative result does not preclude SARS-CoV-2 infection and should not be used as the sole basis for treatment or other patient management decisions.  A negative result may occur with improper specimen collection /  handling, submission of specimen other than nasopharyngeal swab, presence of viral mutation(s) within the areas targeted by this assay, and inadequate number of viral copies (<250 copies / mL). A negative result must be combined with clinical observations, patient history, and epidemiological information. Fact Sheet for Patients:   StrictlyIdeas.no Fact Sheet for Healthcare Providers: BankingDealers.co.za This test is not yet approved or cleared  by the [place] FDA and has been authorized for detection and/or diagnosis of SARS-CoV-2 by FDA under an Emergency Use Authorization (EUA).  This EUA will remain in effect (meaning this test can be used) for the duration of the COVID-19 declaration under Section 564(b)(1) of the Act, 21 U.S.C. section 360bbb-3(b)(1), unless the authorization is terminated or revoked sooner. Performed at [facility], [place] [address], [place], [place] [address]   MRSA PCR Screening     Status: None   Collection Time: 03/10/20  5:30 PM   Specimen: Nasal Mucosa; Nasopharyngeal  Result Value Ref Range Status   MRSA by PCR NEGATIVE NEGATIVE Final    Comment:        The GeneXpert MRSA Assay (FDA approved for NASAL specimens only), is one component of a comprehensive MRSA colonization surveillance program. It is not intended to diagnose MRSA infection nor to guide or monitor treatment for MRSA infections. Performed at [facility], [place] [address] [place], [place] [address]      Time coordinating discharge: Over 30 minutes  SIGNED:   Vennela Jutte J [place] DO  [facility] 03/13/2020, 3:23 PM

## 2020-03-13 NOTE — Progress Notes (Signed)
PT Cancellation Note  Patient Details Name: Bethany [name] MRN: [id number] DOB: Feb 20, 1946   Cancelled Treatment:    Reason Eval/Treat Not Completed: Patient at procedure or test/unavailable. Pt at HD.    [place] 03/13/2020, 9:19 AM [place] Pager [phone] Office [phone]

## 2020-03-13 NOTE — Progress Notes (Deleted)
Admission  From the cath lab  By bed awake and alert

## 2020-03-13 NOTE — Progress Notes (Signed)
Discharged home accompanied by sister. Discharge instructions given, belongings taken home.
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
## 2020-03-13 NOTE — Progress Notes (Signed)
OT Cancellation Note  Patient Details Name: Carlia Bomkamp MRN: [id number] DOB: 07/03/1946   Cancelled Treatment:    Reason Eval/Treat Not Completed: Patient at procedure or test/ unavailable(Pt at HD this AM, OT to follow-up as needed.)   OT to follow-up for OT eval later today.   Jefferey Pica, OTR/L Acute Rehabilitation Services Pager: [phone] Office: [phone]   Jerene Pitch 03/13/2020, 7:45 AM
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
## 2020-03-13 NOTE — Progress Notes (Signed)
Transported  to HD by bed awake and alert, stable .

## 2020-03-13 NOTE — Progress Notes (Signed)
Bethany [name] Progress Note  Subjective: seen in HD, no c/o's , in good spirits and joking around  Vitals:   03/13/20 1030 03/13/20 1100 03/13/20 1130 03/13/20 1258  BP: (!) 144/53 108/65 (!) 98/49   Pulse: 84 87 85 93  Resp: $Remo'18 18 18   'VjbTq$ Temp:      TempSrc:      SpO2:    92%  Weight:      Height:        Exam:   alert, nad   no jvd  Chest cta bilat  Cor reg no RG  Abd soft ntnd no ascites   Ext no LE edema   Alert, NF, ox3  AVG L arm    Dialysis: TTS [facility]   4h  46.5kg  400/800   2/2 bath  Hep none  L arm AVG   mircera 200 q 2, last 5/1   Calcitriol 1ug tiw   Assessment/ Plan: 1Seizure-like activity: EEG without seizure activity. + UDS for cocaine, per neuro/ primary. AMS likely uremia +/- drug related. Much better w/ HD and hospital time.  2 ESRD:TTS, missed since 5/8. Had HD here 5/15 and again today off schedule. Appears back to normal MS.  Plan HD tomorrow, either inpt or OP depending on PT eval.  3 Hypertension:improved with UF, meds. Now on hydralazine, +cardizem for SVT, not getting home atenolol here.  4.  Tachycardia/ SVT: transient issue, post HD here, resolved, in NSR now 5. Anemia of ESRD:no indication for ESA at this time 6. Metabolic Bone Disease:Calcitriol 1.0 q rx, binders 7. Nutrition: renal vit with fluid restriction 8. Dispo: ok for dc from dialysis standpoint     Bethany [name] 03/13/2020, 1:36 PM   Recent Labs  Lab 03/12/20 0205 03/13/20 0221  K 4.1 4.2  BUN 19 26*  CREATININE 6.57* 7.79*  CALCIUM 8.7* 8.7*  PHOS 8.0* 8.2*  HGB 12.7 14.0   Inpatient medications: . Chlorhexidine Gluconate Cloth  6 each Topical Q0600  . diltiazem  180 mg Oral Daily  . heparin  5,000 Units Subcutaneous Q8H  . hydrALAZINE  50 mg Oral Q8H   . sodium chloride    . sodium chloride     sodium chloride, sodium chloride, labetalol, lidocaine (PF), lidocaine-prilocaine, pentafluoroprop-tetrafluoroeth

## 2020-03-17 ENCOUNTER — Emergency Department (HOSPITAL_COMMUNITY): Payer: Medicare Other

## 2020-03-17 ENCOUNTER — Other Ambulatory Visit: Payer: Self-pay

## 2020-03-17 ENCOUNTER — Inpatient Hospital Stay (HOSPITAL_COMMUNITY)
Admission: EM | Admit: 2020-03-17 | Discharge: 2020-03-22 | DRG: 871 | Disposition: A | Discharge status: Home Health | Payer: Medicare Other | Attending: Internal Medicine | Admitting: Internal Medicine

## 2020-03-17 DIAGNOSIS — Z9119 Patient's noncompliance with other medical treatment and regimen: Secondary | ICD-10-CM | POA: Diagnosis not present

## 2020-03-17 DIAGNOSIS — Z992 Dependence on renal dialysis: Secondary | ICD-10-CM

## 2020-03-17 DIAGNOSIS — Z79899 Other long term (current) drug therapy: Secondary | ICD-10-CM

## 2020-03-17 DIAGNOSIS — F17210 Nicotine dependence, cigarettes, uncomplicated: Secondary | ICD-10-CM | POA: Diagnosis present

## 2020-03-17 DIAGNOSIS — G9341 Metabolic encephalopathy: Secondary | ICD-10-CM | POA: Diagnosis not present

## 2020-03-17 DIAGNOSIS — N186 End stage renal disease: Secondary | ICD-10-CM | POA: Diagnosis present

## 2020-03-17 DIAGNOSIS — I160 Hypertensive urgency: Secondary | ICD-10-CM | POA: Diagnosis present

## 2020-03-17 DIAGNOSIS — A419 Sepsis, unspecified organism: Secondary | ICD-10-CM | POA: Diagnosis present

## 2020-03-17 DIAGNOSIS — J441 Chronic obstructive pulmonary disease with (acute) exacerbation: Secondary | ICD-10-CM | POA: Diagnosis present

## 2020-03-17 DIAGNOSIS — J440 Chronic obstructive pulmonary disease with acute lower respiratory infection: Secondary | ICD-10-CM | POA: Diagnosis present

## 2020-03-17 DIAGNOSIS — Z20822 Contact with and (suspected) exposure to covid-19: Secondary | ICD-10-CM | POA: Diagnosis present

## 2020-03-17 DIAGNOSIS — G92 Toxic encephalopathy: Secondary | ICD-10-CM | POA: Diagnosis present

## 2020-03-17 DIAGNOSIS — Z9115 Patient's noncompliance with renal dialysis: Secondary | ICD-10-CM

## 2020-03-17 DIAGNOSIS — F1910 Other psychoactive substance abuse, uncomplicated: Secondary | ICD-10-CM | POA: Diagnosis not present

## 2020-03-17 DIAGNOSIS — I10 Essential (primary) hypertension: Secondary | ICD-10-CM | POA: Diagnosis present

## 2020-03-17 DIAGNOSIS — T17908A Unspecified foreign body in respiratory tract, part unspecified causing other injury, initial encounter: Secondary | ICD-10-CM

## 2020-03-17 DIAGNOSIS — E8889 Other specified metabolic disorders: Secondary | ICD-10-CM | POA: Diagnosis present

## 2020-03-17 DIAGNOSIS — R4182 Altered mental status, unspecified: Secondary | ICD-10-CM | POA: Diagnosis present

## 2020-03-17 DIAGNOSIS — Z9289 Personal history of other medical treatment: Secondary | ICD-10-CM

## 2020-03-17 DIAGNOSIS — G9340 Encephalopathy, unspecified: Secondary | ICD-10-CM | POA: Insufficient documentation

## 2020-03-17 DIAGNOSIS — J9601 Acute respiratory failure with hypoxia: Secondary | ICD-10-CM | POA: Diagnosis present

## 2020-03-17 DIAGNOSIS — J189 Pneumonia, unspecified organism: Secondary | ICD-10-CM | POA: Diagnosis present

## 2020-03-17 DIAGNOSIS — N2581 Secondary hyperparathyroidism of renal origin: Secondary | ICD-10-CM | POA: Diagnosis present

## 2020-03-17 DIAGNOSIS — E876 Hypokalemia: Secondary | ICD-10-CM | POA: Diagnosis present

## 2020-03-17 DIAGNOSIS — I120 Hypertensive chronic kidney disease with stage 5 chronic kidney disease or end stage renal disease: Secondary | ICD-10-CM | POA: Diagnosis present

## 2020-03-17 DIAGNOSIS — R6520 Severe sepsis without septic shock: Secondary | ICD-10-CM | POA: Diagnosis present

## 2020-03-17 DIAGNOSIS — G2581 Restless legs syndrome: Secondary | ICD-10-CM | POA: Diagnosis present

## 2020-03-17 DIAGNOSIS — E785 Hyperlipidemia, unspecified: Secondary | ICD-10-CM | POA: Diagnosis present

## 2020-03-17 DIAGNOSIS — F0150 Vascular dementia without behavioral disturbance: Secondary | ICD-10-CM | POA: Diagnosis present

## 2020-03-17 DIAGNOSIS — G929 Unspecified toxic encephalopathy: Secondary | ICD-10-CM

## 2020-03-17 DIAGNOSIS — J9621 Acute and chronic respiratory failure with hypoxia: Secondary | ICD-10-CM | POA: Diagnosis not present

## 2020-03-17 DIAGNOSIS — D638 Anemia in other chronic diseases classified elsewhere: Secondary | ICD-10-CM | POA: Diagnosis present

## 2020-03-17 LAB — BRAIN NATRIURETIC PEPTIDE: B Natriuretic Peptide: 1690.1 pg/mL — ABNORMAL HIGH (ref 0.0–100.0)

## 2020-03-17 LAB — POCT I-STAT 7, (LYTES, BLD GAS, ICA,H+H)
Acid-Base Excess: 14 mmol/L — ABNORMAL HIGH (ref 0.0–2.0)
Acid-Base Excess: 6 mmol/L — ABNORMAL HIGH (ref 0.0–2.0)
Bicarbonate: 39.3 mmol/L — ABNORMAL HIGH (ref 20.0–28.0)
Bicarbonate: 32.7 mmol/L — ABNORMAL HIGH (ref 20.0–28.0)
Calcium, Ion: 1.12 mmol/L — ABNORMAL LOW (ref 1.15–1.40)
Calcium, Ion: 1.17 mmol/L (ref 1.15–1.40)
HCT: 41 % (ref 36.0–46.0)
HCT: 40 % (ref 36.0–46.0)
Hemoglobin: 13.9 g/dL (ref 12.0–15.0)
Hemoglobin: 13.6 g/dL (ref 12.0–15.0)
O2 Saturation: 92 %
O2 Saturation: 100 %
Patient temperature: 96.2
Patient temperature: 98.6
Potassium: 3.3 mmol/L — ABNORMAL LOW (ref 3.5–5.1)
Potassium: 3.4 mmol/L — ABNORMAL LOW (ref 3.5–5.1)
Sodium: 136 mmol/L (ref 135–145)
Sodium: 138 mmol/L (ref 135–145)
TCO2: 41 mmol/L — ABNORMAL HIGH (ref 22–32)
TCO2: 34 mmol/L — ABNORMAL HIGH (ref 22–32)
pCO2 arterial: 45.3 mmHg (ref 32.0–48.0)
pCO2 arterial: 52.8 mmHg — ABNORMAL HIGH (ref 32.0–48.0)
pH, Arterial: 7.542 — ABNORMAL HIGH (ref 7.350–7.450)
pH, Arterial: 7.4 (ref 7.350–7.450)
pO2, Arterial: 54 mmHg — ABNORMAL LOW (ref 83.0–108.0)
pO2, Arterial: 349 mmHg — ABNORMAL HIGH (ref 83.0–108.0)

## 2020-03-17 LAB — CBC WITH DIFFERENTIAL/PLATELET
Abs Immature Granulocytes: 0.02 10*3/uL (ref 0.00–0.07)
Basophils Absolute: 0 10*3/uL (ref 0.0–0.1)
Basophils Relative: 1 %
Eosinophils Absolute: 0.1 10*3/uL (ref 0.0–0.5)
Eosinophils Relative: 2 %
HCT: 45.8 % (ref 36.0–46.0)
Hemoglobin: 13.7 g/dL (ref 12.0–15.0)
Immature Granulocytes: 0 %
Lymphocytes Relative: 20 %
Lymphs Abs: 0.9 10*3/uL (ref 0.7–4.0)
MCH: 26.3 pg (ref 26.0–34.0)
MCHC: 29.9 g/dL — ABNORMAL LOW (ref 30.0–36.0)
MCV: 88.1 fL (ref 80.0–100.0)
Monocytes Absolute: 0.6 10*3/uL (ref 0.1–1.0)
Monocytes Relative: 14 %
Neutro Abs: 2.9 10*3/uL (ref 1.7–7.7)
Neutrophils Relative %: 63 %
Platelets: 277 10*3/uL (ref 150–400)
RBC: 5.2 MIL/uL — ABNORMAL HIGH (ref 3.87–5.11)
RDW: 18.2 % — ABNORMAL HIGH (ref 11.5–15.5)
WBC: 4.5 10*3/uL (ref 4.0–10.5)
nRBC: 0 % (ref 0.0–0.2)

## 2020-03-17 LAB — COMPREHENSIVE METABOLIC PANEL
ALT: 17 U/L (ref 0–44)
AST: 27 U/L (ref 15–41)
Albumin: 3 g/dL — ABNORMAL LOW (ref 3.5–5.0)
Alkaline Phosphatase: 66 U/L (ref 38–126)
Anion gap: 14 (ref 5–15)
BUN: 15 mg/dL (ref 8–23)
CO2: 32 mmol/L (ref 22–32)
Calcium: 9.2 mg/dL (ref 8.9–10.3)
Chloride: 94 mmol/L — ABNORMAL LOW (ref 98–111)
Creatinine, Ser: 4.61 mg/dL — ABNORMAL HIGH (ref 0.44–1.00)
GFR calc Af Amer: 10 mL/min — ABNORMAL LOW (ref >60)
GFR calc non Af Amer: 9 mL/min — ABNORMAL LOW (ref >60)
Glucose, Bld: 96 mg/dL (ref 70–99)
Potassium: 3 mmol/L — ABNORMAL LOW (ref 3.5–5.1)
Sodium: 140 mmol/L (ref 135–145)
Total Bilirubin: 0.9 mg/dL (ref 0.3–1.2)
Total Protein: 6.4 g/dL — ABNORMAL LOW (ref 6.5–8.1)

## 2020-03-17 LAB — URINALYSIS, ROUTINE W REFLEX MICROSCOPIC
Bilirubin Urine: NEGATIVE
Glucose, UA: 50 mg/dL — AB
Ketones, ur: NEGATIVE mg/dL
Leukocytes,Ua: NEGATIVE
Nitrite: NEGATIVE
Protein, ur: >=300 mg/dL — AB
RBC / HPF: >50 RBC/hpf — ABNORMAL HIGH (ref 0–5)
Specific Gravity, Urine: 1.005 (ref 1.005–1.030)
pH: 9 — ABNORMAL HIGH (ref 5.0–8.0)

## 2020-03-17 LAB — LACTIC ACID, PLASMA
Lactic Acid, Venous: 1.4 mmol/L (ref 0.5–1.9)
Lactic Acid, Venous: 1.2 mmol/L (ref 0.5–1.9)

## 2020-03-17 LAB — RAPID URINE DRUG SCREEN, HOSP PERFORMED
Amphetamines: NOT DETECTED
Barbiturates: NOT DETECTED
Benzodiazepines: NOT DETECTED
Cocaine: NOT DETECTED
Opiates: NOT DETECTED
Tetrahydrocannabinol: NOT DETECTED

## 2020-03-17 LAB — PROTIME-INR
INR: 1 (ref 0.8–1.2)
Prothrombin Time: 12.3 seconds (ref 11.4–15.2)

## 2020-03-17 LAB — ABO/RH: ABO/RH(D): A NEG

## 2020-03-17 LAB — CK: Total CK: 258 U/L — ABNORMAL HIGH (ref 38–234)

## 2020-03-17 LAB — LIPASE, BLOOD: Lipase: 24 U/L (ref 11–51)

## 2020-03-17 LAB — ETHANOL: Alcohol, Ethyl (B): <10 mg/dL (ref <10)

## 2020-03-17 LAB — APTT: aPTT: 35 seconds (ref 24–36)

## 2020-03-17 LAB — TYPE AND SCREEN
ABO/RH(D): A NEG
Antibody Screen: NEGATIVE

## 2020-03-17 LAB — AMMONIA: Ammonia: 22 umol/L (ref 9–35)

## 2020-03-17 LAB — SARS CORONAVIRUS 2 BY RT PCR (HOSPITAL ORDER, PERFORMED IN ~~LOC~~ HOSPITAL LAB): SARS Coronavirus 2: NEGATIVE

## 2020-03-17 LAB — CBG MONITORING, ED: Glucose-Capillary: 103 mg/dL — ABNORMAL HIGH (ref 70–99)

## 2020-03-17 IMAGING — DX DG CHEST 1V PORT
1 series · 1 of 1 positions shown · non-contrast
Comparison: [DATE]

CLINICAL DATA: Hypothermia and shortness of breath

EXAM:
PORTABLE CHEST 1 VIEW

[chest ap]
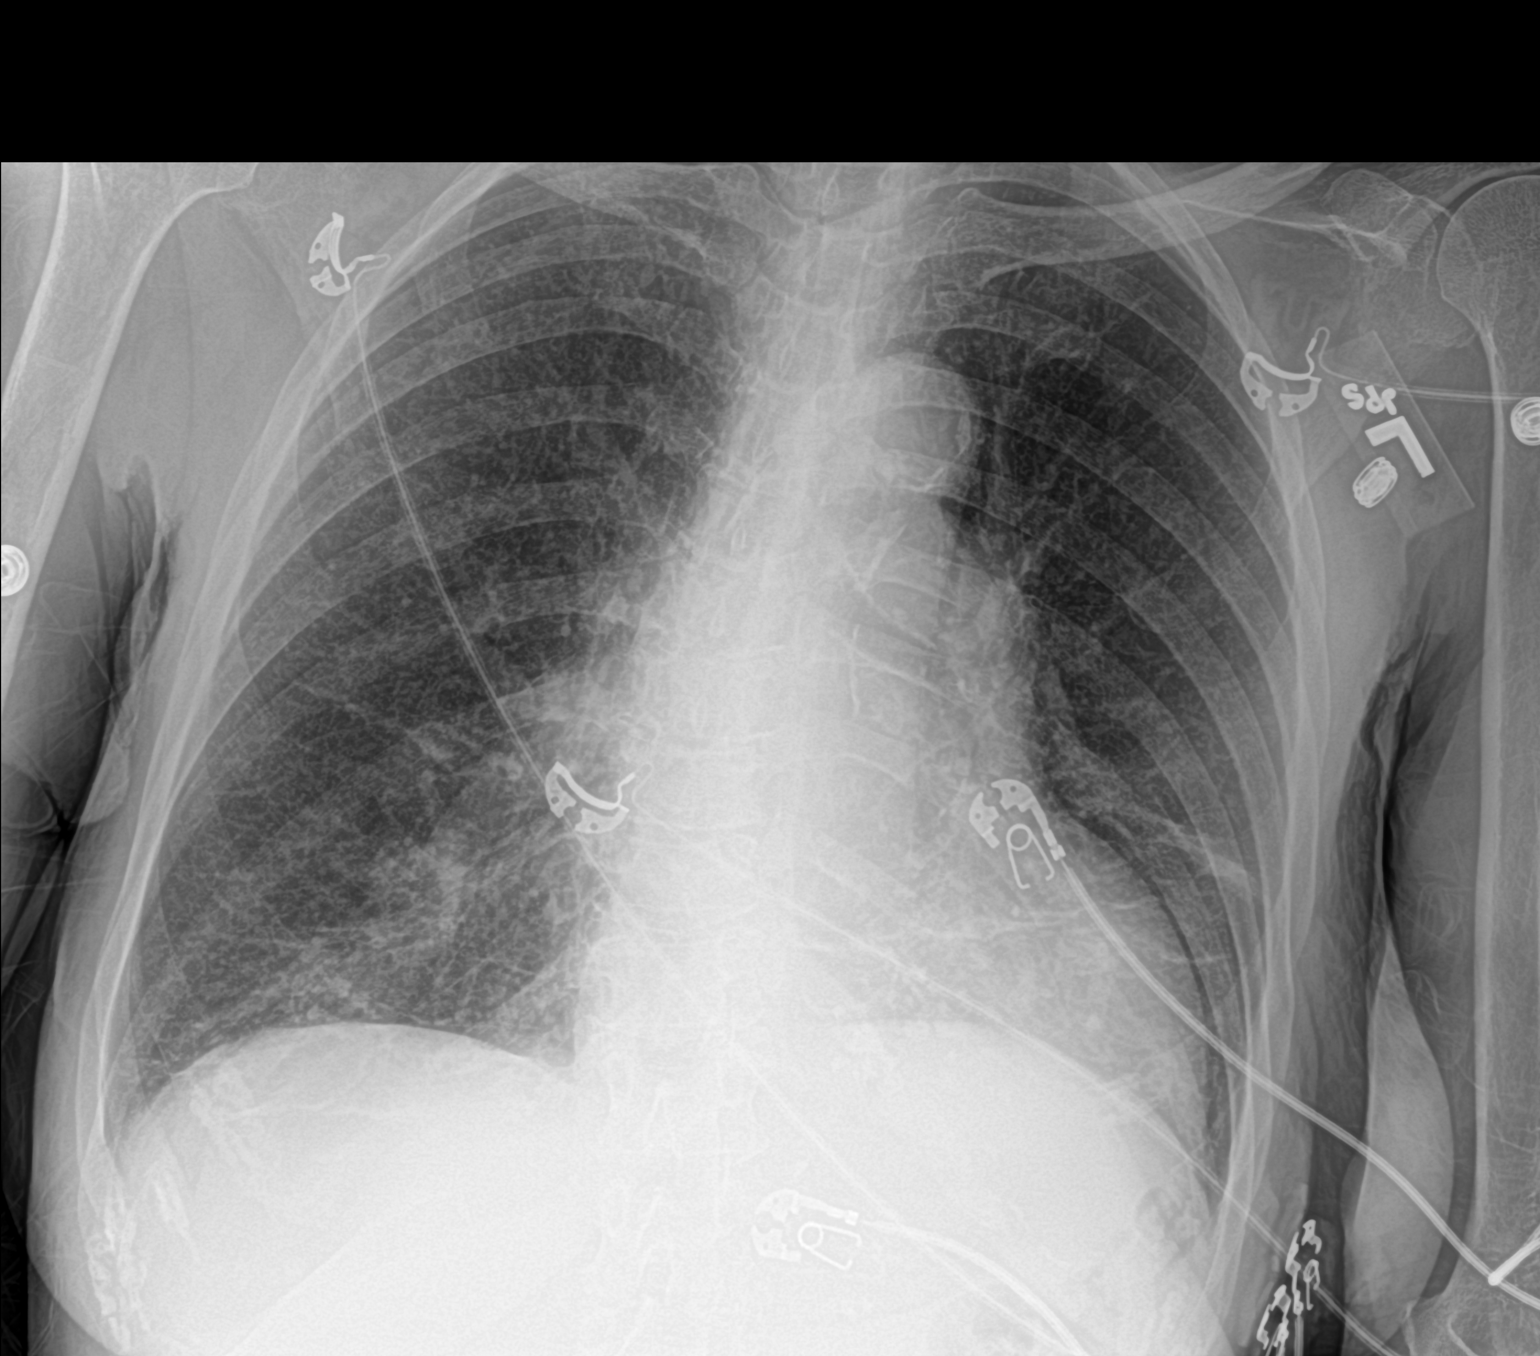

[1 of 1 positions shown; findings below may reference images not displayed]

FINDINGS: Mild cardiomegaly that is stable. Negative mediastinal contours.
Chronic interstitial coarsening with mild streaky scarring or
atelectasis at the bases. No effusion or pneumothorax.
Dextroscoliosis.
IMPRESSION: 1. Atelectasis or scarring on both sides.
2. Possible vascular congestion.

## 2020-03-17 IMAGING — CT CT HEAD W/O CM
3 series · 14 of 47 positions shown, 16 images · non-contrast
Comparison: [DATE]. Brain MR dated [DATE]

CLINICAL DATA: Altered mental status.

EXAM:
CT HEAD WITHOUT CONTRAST
TECHNIQUE: Contiguous axial images were obtained from the base of the skull
through the vertex without intravenous contrast.

[Series 3: head 5.0 h30s · axial · 0.41mm/px · z∈[-137,-12]mm · 8 of 30 slices shown, 10 images]
[im 3/30  brain]
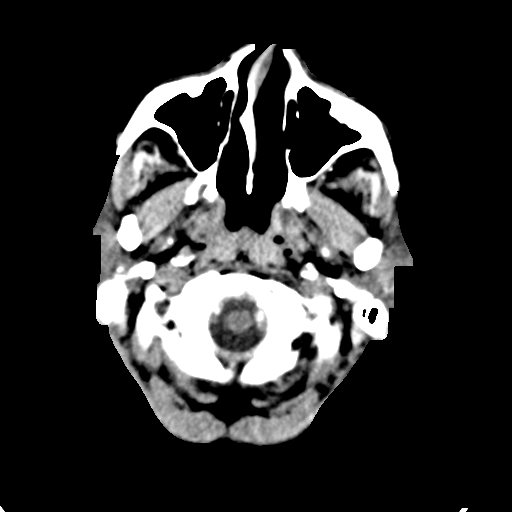
[im 3/30  bone]
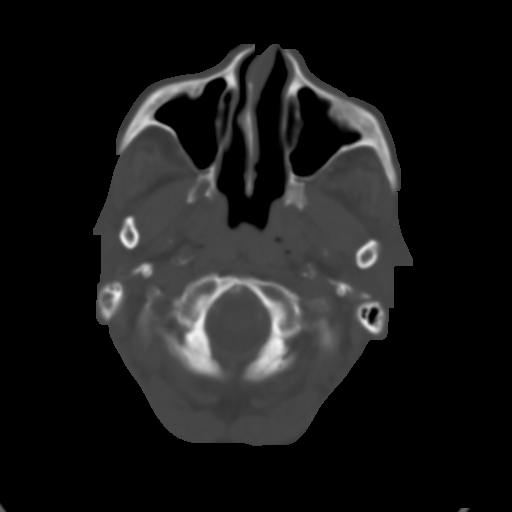
[im 7/30  brain]
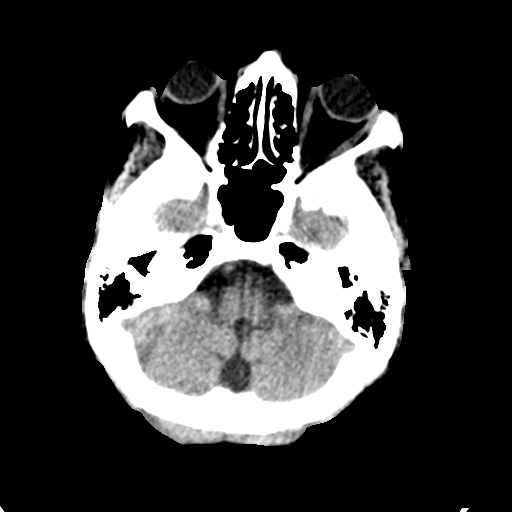
[im 10/30  brain]
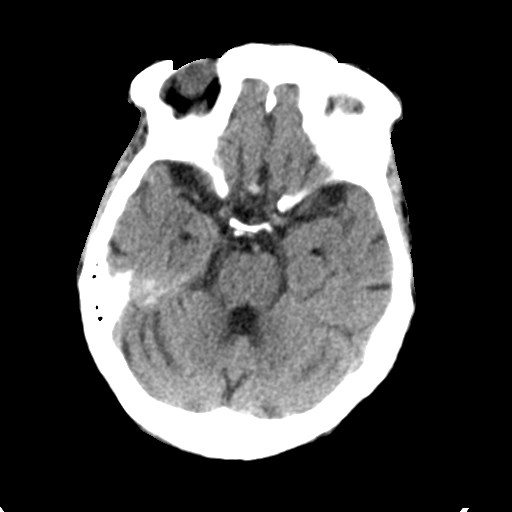
[im 14/30  brain]
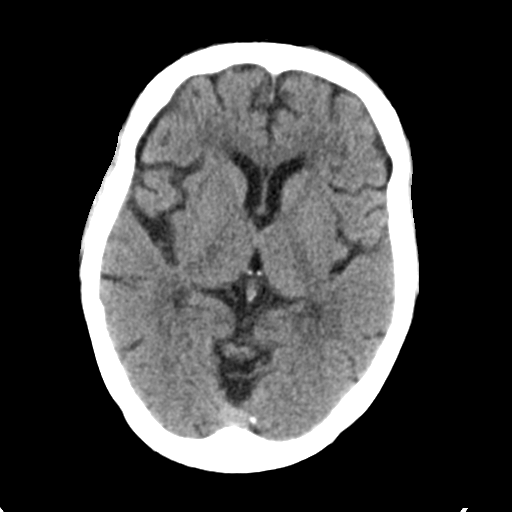
[im 17/30  brain]
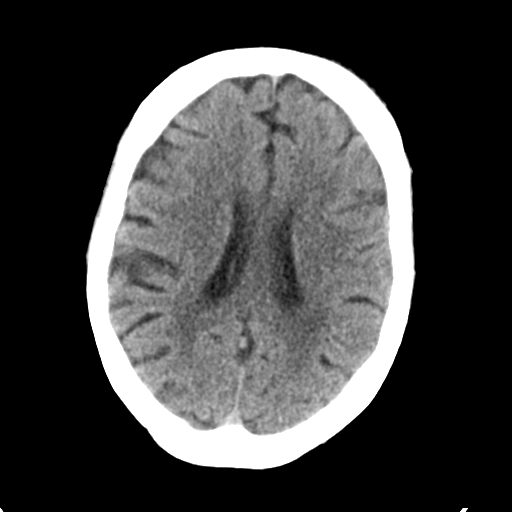
[im 17/30  bone]
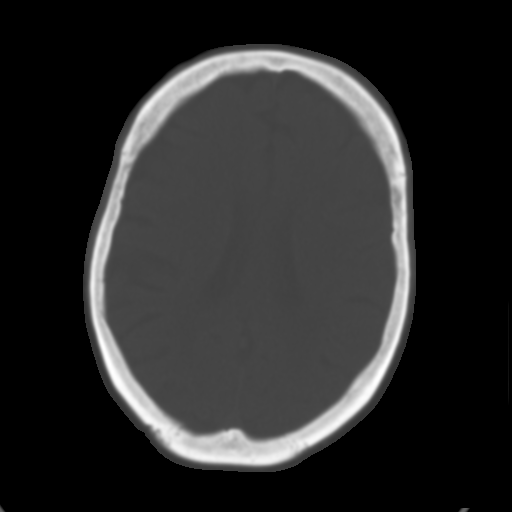
[im 21/30  brain]
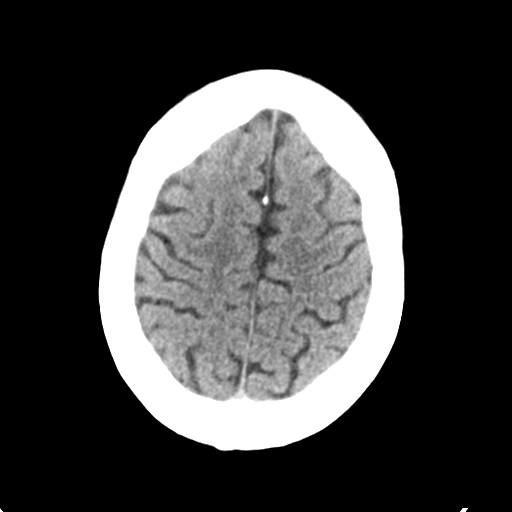
[im 24/30  brain]
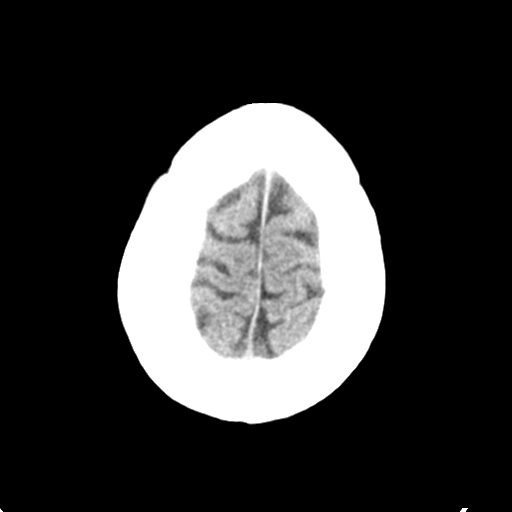
[im 28/30  brain]
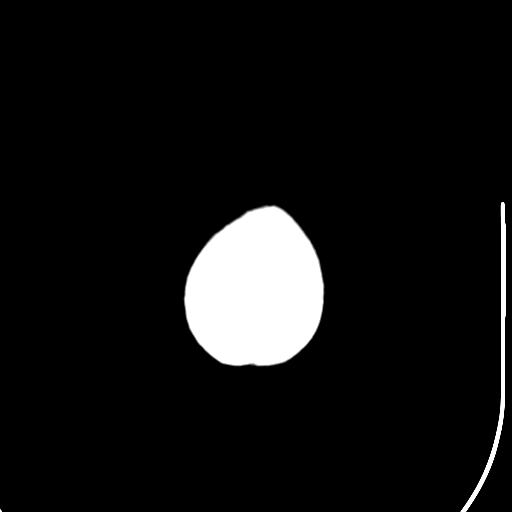

[Series 5: head 3.0 mpr cor · coronal · 0.31mm/px · 3 of 67 slices shown]
[im 23/67  brain]
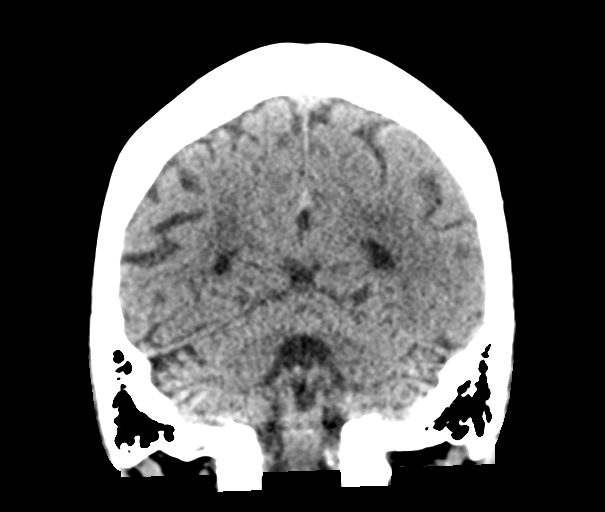
[im 30/67  brain]
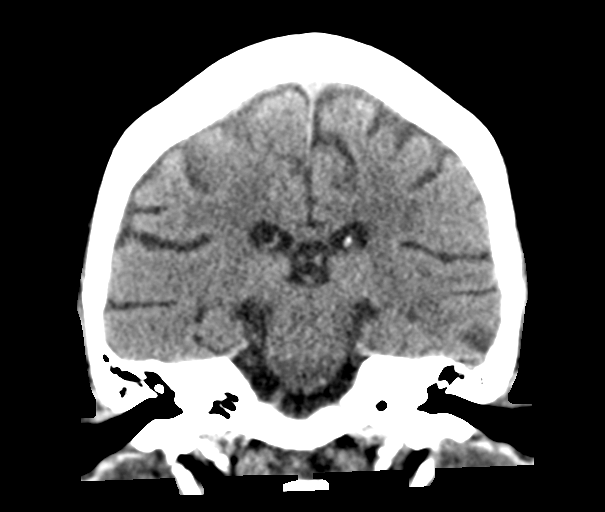
[im 37/67  brain]
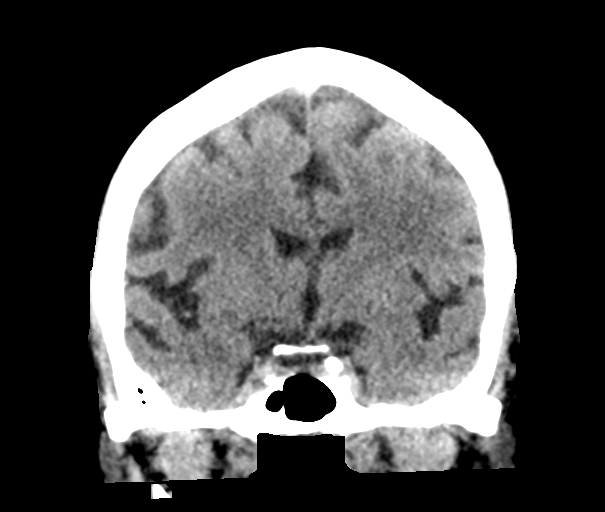

[Series 6: head 3.0 mpr sag · sagittal · 0.31mm/px · 3 of 62 slices shown]
[im 21/62  brain]
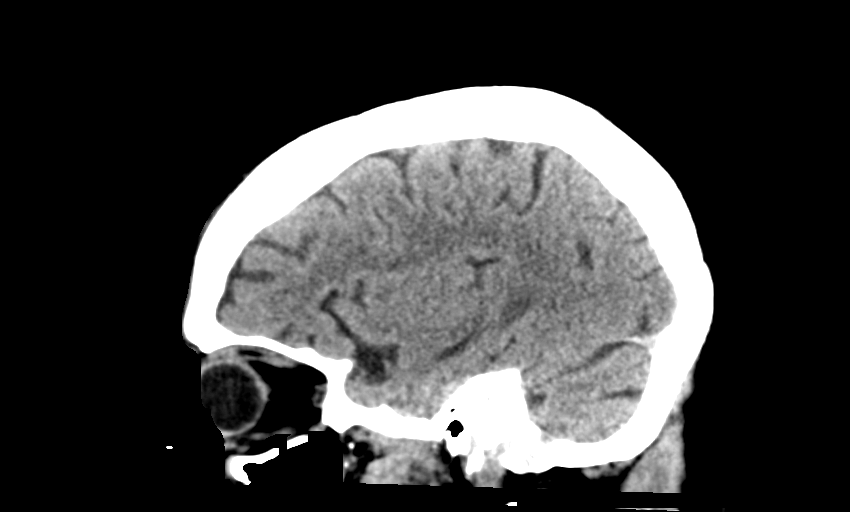
[im 31/62  brain]
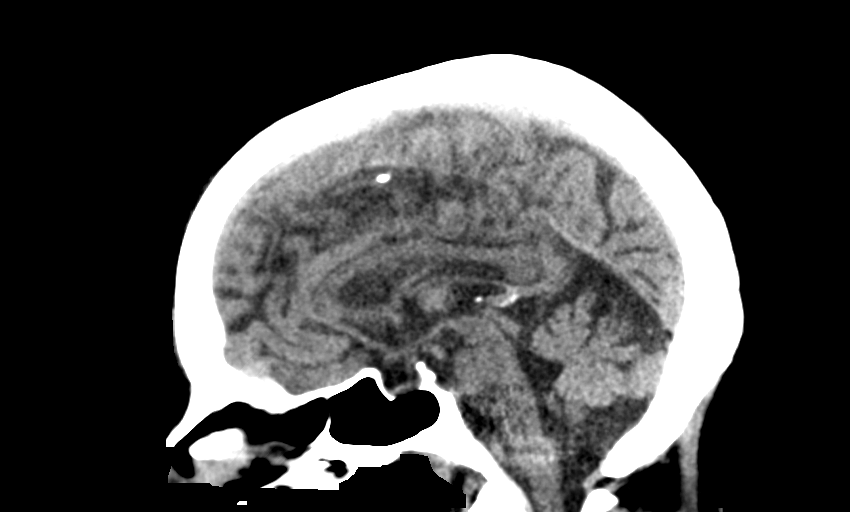
[im 41/62  brain]
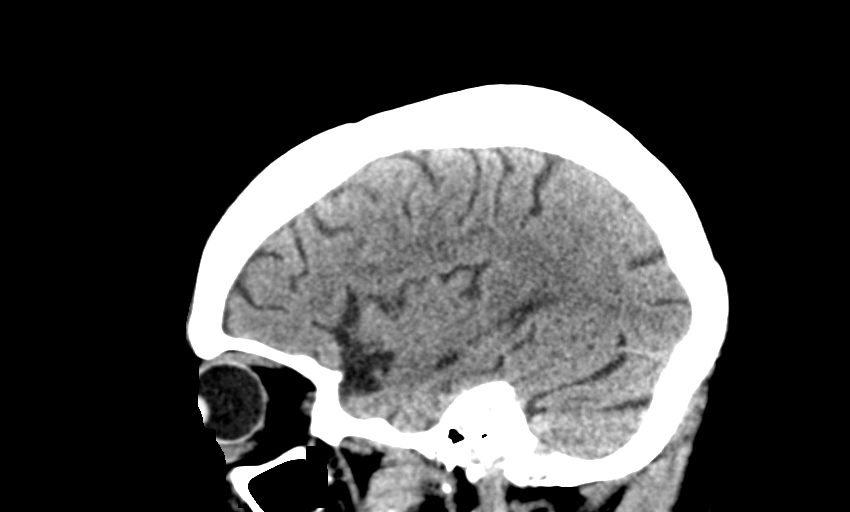

[14 of 47 positions shown; findings below may reference images not displayed]

FINDINGS: Brain: Stable mildly enlarged ventricles and cortical sulci.
Mild-to-moderate patchy white matter low density in both cerebral
hemispheres without significant change. No intracranial hemorrhage,
mass lesion or CT evidence of acute infarction.

Vascular: No hyperdense vessel or unexpected calcification.

Skull: Normal. Negative for fracture or focal lesion.

Sinuses/Orbits: Unremarkable.

Other: None.
IMPRESSION: 1. No acute abnormality.
2. Stable mild diffuse cerebral and cerebellar atrophy and mild to
moderate chronic small vessel white matter ischemic changes in both
cerebral hemispheres.

## 2020-03-17 IMAGING — DX DG CHEST 1V PORT
1 series · 1 of 1 positions shown · non-contrast
Comparison: Earlier same day.

CLINICAL DATA: Endotracheal placement.

EXAM:
PORTABLE CHEST 1 VIEW

[chest]
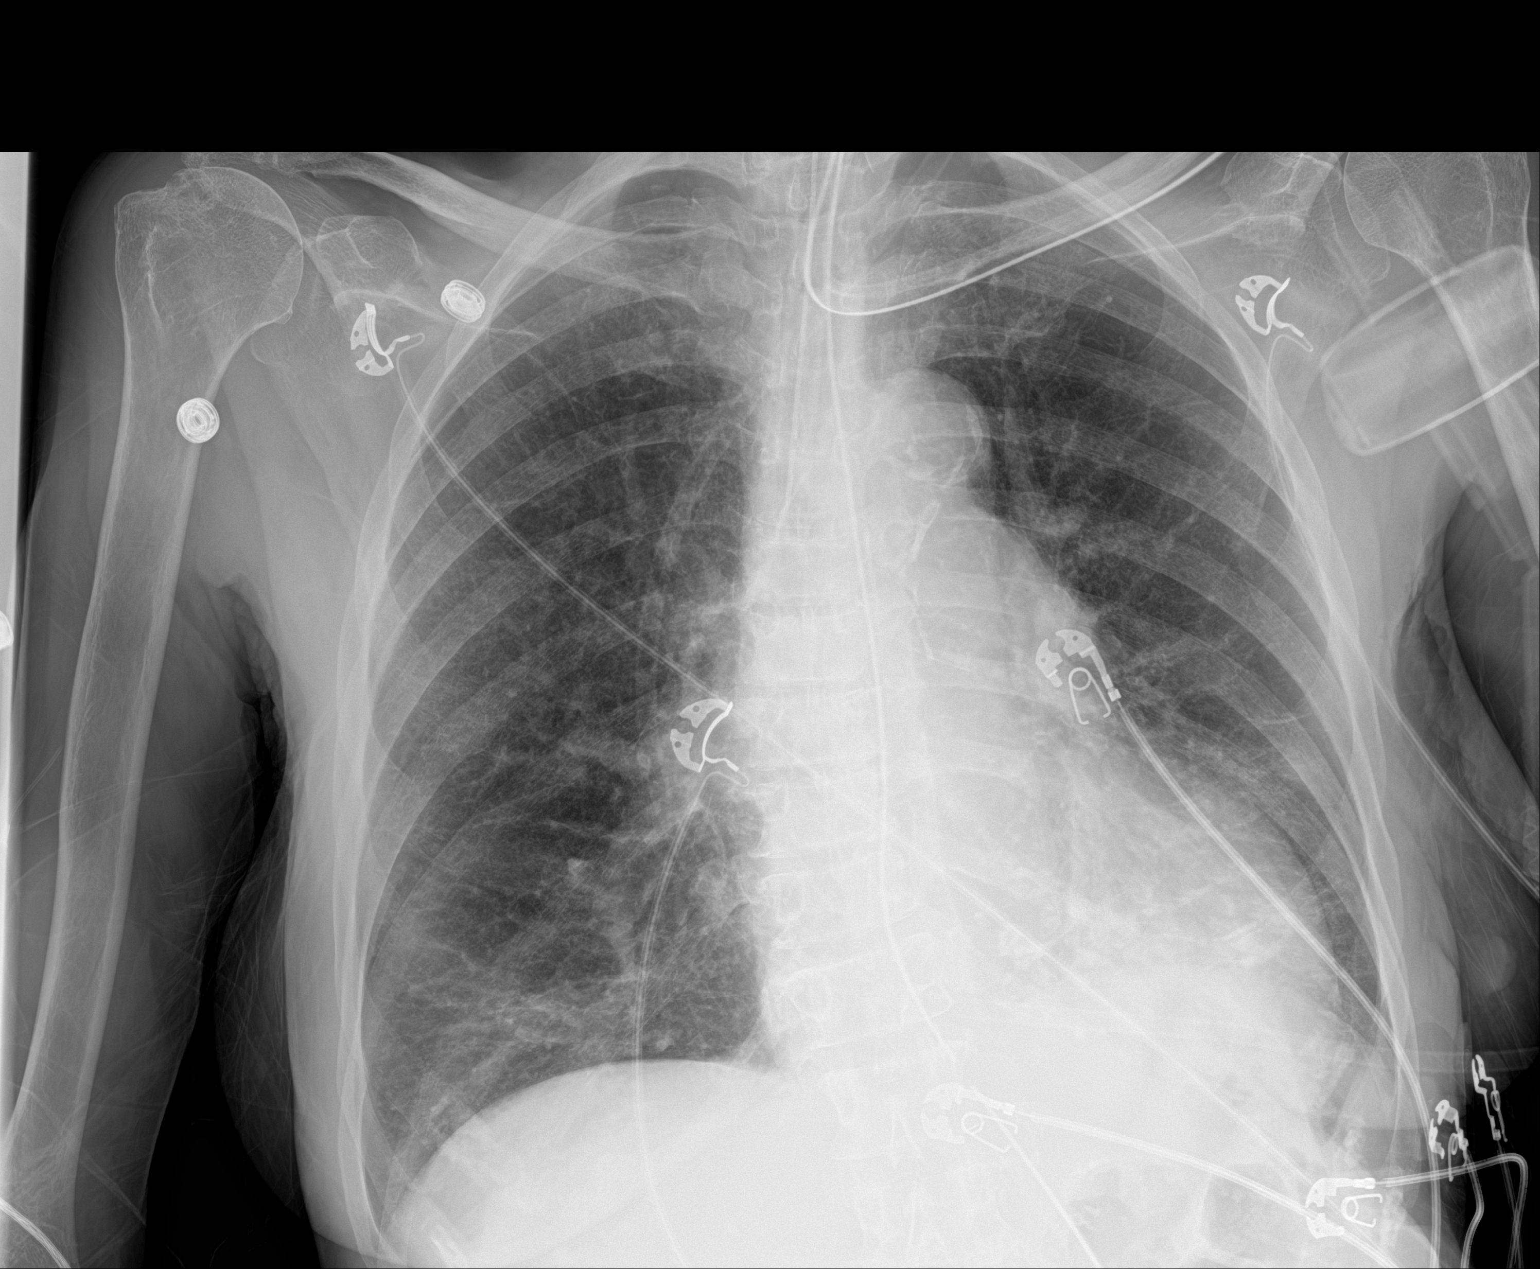

[1 of 1 positions shown; findings below may reference images not displayed]

FINDINGS: Endotracheal tube is at the carina, directed towards the right
mainstem bronchus. Orogastric or nasogastric tube enters the
stomach. Mild interstitial edema is improved. Mild atelectasis
present at the left lung base.
IMPRESSION: Endotracheal tube at the carina, directed towards the right mainstem
bronchus.

Nasogastric or orogastric tube enters the stomach.

Improved interstitial edema. Slight worsening of left base
atelectasis.

## 2020-03-17 MED ORDER — IPRATROPIUM-ALBUTEROL 0.5-2.5 (3) MG/3ML IN SOLN: 3.0000 mL | RESPIRATORY_TRACT | Status: DC | PRN

## 2020-03-17 MED ORDER — POLYETHYLENE GLYCOL 3350 17 G PO PACK: 17.0000 g | PACK | Freq: Every day | ORAL | Status: DC | PRN

## 2020-03-17 MED ORDER — PROPOFOL 1000 MG/100ML IV EMUL
0.0000 ug/kg/min | INTRAVENOUS | Status: DC
  Administered 2020-03-17 – 2020-03-18 (×2): 30 ug/kg/min via INTRAVENOUS
  Filled 2020-03-17 (×2): qty 100

## 2020-03-17 MED ORDER — SODIUM CHLORIDE 0.9 % IV SOLN
2.0000 g | INTRAVENOUS | Status: DC
  Filled 2020-03-17: qty 2

## 2020-03-17 MED ORDER — DOCUSATE SODIUM 50 MG/5ML PO LIQD
100.0000 mg | Freq: Two times a day (BID) | ORAL | Status: DC
  Filled 2020-03-17 (×3): qty 10

## 2020-03-17 MED ORDER — DOCUSATE SODIUM 100 MG PO CAPS: 100.0000 mg | ORAL_CAPSULE | Freq: Two times a day (BID) | ORAL | Status: DC | PRN

## 2020-03-17 MED ORDER — VANCOMYCIN HCL 500 MG/100ML IV SOLN
500.0000 mg | INTRAVENOUS | Status: DC
  Administered 2020-03-18: 500 mg via INTRAVENOUS
  Filled 2020-03-17: qty 100

## 2020-03-17 MED ORDER — CHLORHEXIDINE GLUCONATE 0.12% ORAL RINSE (MEDLINE KIT): 15.0000 mL | Freq: Two times a day (BID) | OROMUCOSAL | Status: DC

## 2020-03-17 MED ORDER — IPRATROPIUM BROMIDE 0.02 % IN SOLN
0.5000 mg | Freq: Once | RESPIRATORY_TRACT | Status: DC
  Filled 2020-03-17: qty 2.5

## 2020-03-17 MED ORDER — SODIUM CHLORIDE 0.9 % IV SOLN
2.0000 g | Freq: Once | INTRAVENOUS | Status: AC
  Administered 2020-03-17: 2 g via INTRAVENOUS
  Filled 2020-03-17: qty 2

## 2020-03-17 MED ORDER — ARFORMOTEROL TARTRATE 15 MCG/2ML IN NEBU
15.0000 ug | INHALATION_SOLUTION | Freq: Two times a day (BID) | RESPIRATORY_TRACT | Status: DC
  Administered 2020-03-18: 15 ug via RESPIRATORY_TRACT
  Filled 2020-03-17 (×4): qty 2

## 2020-03-17 MED ORDER — ACETAMINOPHEN 325 MG PO TABS: 650.0000 mg | ORAL_TABLET | ORAL | Status: DC | PRN

## 2020-03-17 MED ORDER — DOCUSATE SODIUM 50 MG/5ML PO LIQD
100.0000 mg | Freq: Two times a day (BID) | ORAL | Status: DC
  Administered 2020-03-18 – 2020-03-22 (×3): 100 mg
  Filled 2020-03-17 (×5): qty 10

## 2020-03-17 MED ORDER — ORAL CARE MOUTH RINSE: 15.0000 mL | OROMUCOSAL | Status: DC

## 2020-03-17 MED ORDER — BUDESONIDE 0.5 MG/2ML IN SUSP
0.5000 mg | Freq: Two times a day (BID) | RESPIRATORY_TRACT | Status: DC
  Administered 2020-03-17 – 2020-03-22 (×10): 0.5 mg via RESPIRATORY_TRACT
  Filled 2020-03-17 (×11): qty 2

## 2020-03-17 MED ORDER — DILTIAZEM 12 MG/ML ORAL SUSPENSION
30.0000 mg | Freq: Four times a day (QID) | ORAL | Status: DC
  Administered 2020-03-17: 30 mg
  Filled 2020-03-17 (×5): qty 3

## 2020-03-17 MED ORDER — PROPOFOL 1000 MG/100ML IV EMUL: 5.0000 ug/kg/min | INTRAVENOUS | Status: DC

## 2020-03-17 MED ORDER — LACTATED RINGERS IV SOLN: INTRAVENOUS | Status: DC

## 2020-03-17 MED ORDER — CHLORHEXIDINE GLUCONATE CLOTH 2 % EX PADS
6.0000 | MEDICATED_PAD | Freq: Every day | CUTANEOUS | Status: DC
  Administered 2020-03-18: 6 via TOPICAL

## 2020-03-17 MED ORDER — METRONIDAZOLE IN NACL 5-0.79 MG/ML-% IV SOLN
500.0000 mg | Freq: Once | INTRAVENOUS | Status: AC
  Administered 2020-03-17: 500 mg via INTRAVENOUS
  Filled 2020-03-17: qty 100

## 2020-03-17 MED ORDER — SUCCINYLCHOLINE CHLORIDE 20 MG/ML IJ SOLN
INTRAMUSCULAR | Status: AC | PRN
  Administered 2020-03-17: 100 mg via INTRAVENOUS

## 2020-03-17 MED ORDER — PROPOFOL 1000 MG/100ML IV EMUL
INTRAVENOUS | Status: AC
  Administered 2020-03-17: 30 ug/kg/min via INTRAVENOUS
  Filled 2020-03-17: qty 100

## 2020-03-17 MED ORDER — VANCOMYCIN HCL IN DEXTROSE 1-5 GM/200ML-% IV SOLN
1000.0000 mg | Freq: Once | INTRAVENOUS | Status: AC
  Administered 2020-03-17: 1000 mg via INTRAVENOUS
  Filled 2020-03-17: qty 200

## 2020-03-17 MED ORDER — ONDANSETRON HCL 4 MG/2ML IJ SOLN
4.0000 mg | Freq: Four times a day (QID) | INTRAMUSCULAR | Status: DC | PRN
  Administered 2020-03-17: 4 mg via INTRAVENOUS
  Filled 2020-03-17: qty 2

## 2020-03-17 MED ORDER — ORAL CARE MOUTH RINSE
15.0000 mL | OROMUCOSAL | Status: DC
  Administered 2020-03-17 – 2020-03-18 (×9): 15 mL via OROMUCOSAL

## 2020-03-17 MED ORDER — SODIUM CHLORIDE 0.9 % IV SOLN
1000.0000 mL | INTRAVENOUS | Status: DC
  Administered 2020-03-17: 1000 mL via INTRAVENOUS

## 2020-03-17 MED ORDER — ETOMIDATE 2 MG/ML IV SOLN
INTRAVENOUS | Status: AC | PRN
  Administered 2020-03-17: 20 mg via INTRAVENOUS

## 2020-03-17 MED ORDER — ALBUTEROL SULFATE (2.5 MG/3ML) 0.083% IN NEBU
5.0000 mg | INHALATION_SOLUTION | Freq: Once | RESPIRATORY_TRACT | Status: DC
  Filled 2020-03-17: qty 6

## 2020-03-17 MED ORDER — POTASSIUM CHLORIDE 20 MEQ/15ML (10%) PO SOLN
20.0000 meq | Freq: Once | ORAL | Status: AC
  Administered 2020-03-17: 20 meq
  Filled 2020-03-17: qty 15

## 2020-03-17 MED ORDER — CHLORHEXIDINE GLUCONATE 0.12% ORAL RINSE (MEDLINE KIT)
15.0000 mL | Freq: Two times a day (BID) | OROMUCOSAL | Status: DC
  Administered 2020-03-17 – 2020-03-18 (×2): 15 mL via OROMUCOSAL

## 2020-03-17 MED ORDER — HEPARIN SODIUM (PORCINE) 5000 UNIT/ML IJ SOLN
5000.0000 [IU] | Freq: Three times a day (TID) | INTRAMUSCULAR | Status: DC
  Administered 2020-03-17 – 2020-03-22 (×14): 5000 [IU] via SUBCUTANEOUS
  Filled 2020-03-17 (×15): qty 1

## 2020-03-17 MED ORDER — ADULT MULTIVITAMIN LIQUID CH
15.0000 mL | Freq: Every day | ORAL | Status: DC
  Administered 2020-03-18 – 2020-03-20 (×2): 15 mL
  Filled 2020-03-17 (×3): qty 15

## 2020-03-17 MED ORDER — PANTOPRAZOLE SODIUM 40 MG PO PACK
40.0000 mg | PACK | Freq: Every day | ORAL | Status: DC
  Administered 2020-03-17 – 2020-03-18 (×2): 40 mg
  Filled 2020-03-17 (×3): qty 20

## 2020-03-17 MED ORDER — FENTANYL CITRATE (PF) 100 MCG/2ML IJ SOLN
25.0000 ug | INTRAMUSCULAR | Status: DC | PRN
  Administered 2020-03-17: 100 ug via INTRAVENOUS
  Administered 2020-03-17: 50 ug via INTRAVENOUS
  Administered 2020-03-18: 100 ug via INTRAVENOUS
  Filled 2020-03-17 (×4): qty 2

## 2020-03-17 MED ORDER — CHLORHEXIDINE GLUCONATE CLOTH 2 % EX PADS: 6.0000 | MEDICATED_PAD | Freq: Every day | CUTANEOUS | Status: DC

## 2020-03-17 MED ORDER — NALOXONE HCL 0.4 MG/ML IJ SOLN
0.4000 mg | Freq: Once | INTRAMUSCULAR | Status: AC
  Administered 2020-03-17: 0.4 mg via INTRAVENOUS
  Filled 2020-03-17: qty 1

## 2020-03-17 MED ORDER — ETOMIDATE 2 MG/ML IV SOLN
INTRAVENOUS | Status: AC | PRN
  Administered 2020-03-17: 10 mg via INTRAVENOUS

## 2020-03-17 MED ORDER — ADULT MULTIVITAMIN LIQUID CH
15.0000 mL | Freq: Every day | ORAL | Status: DC
  Filled 2020-03-17: qty 15

## 2020-03-17 MED ORDER — ATROPINE SULFATE 1 MG/10ML IJ SOSY
PREFILLED_SYRINGE | INTRAMUSCULAR | Status: AC
  Filled 2020-03-17: qty 20

## 2020-03-17 MED ORDER — POLYETHYLENE GLYCOL 3350 17 G PO PACK
17.0000 g | PACK | Freq: Every day | ORAL | Status: DC
  Administered 2020-03-22: 17 g via ORAL
  Filled 2020-03-17 (×3): qty 1

## 2020-03-17 NOTE — Progress Notes (Signed)
1730:  Patient brady down to asystole from monitor.  Sedation turned off.  Pulse palpable.  Code blue called briefly and canceled.  Patient has strong gag and cough.  CCM came to assess patient.  No new orders at this time.  RN to continue to monitor.   Previous 12 lead completed at 1704 and reviewed by CCM.

## 2020-03-17 NOTE — Progress Notes (Signed)
Pharmacy Antibiotic Note  Bethany [name] is a 74 y.o. female admitted on 03/17/2020 with sepsis.  Pharmacy has been consulted for vancomycin and cefepime dosing.  ESRD-HD usually TTS  Plan: Vancomycin $RemoveBeforeDE'1000mg'WUFaQuskVTfibsr$  IV x 1, then $Remov'500mg'DXioUp$  IV qHD Cefepime 2g IV qHD Monitor HD schedule/tolerance, Cx and clinical progression to narrow Vancomycin random level as needed     Temp (24hrs), Avg:96.2 F (35.7 C), Min:96.1 F (35.6 C), Max:96.2 F (35.7 C)  Recent Labs  Lab 03/11/20 0300 03/11/20 0459 03/12/20 0205 03/13/20 0221 03/17/20 0808  WBC 5.0 2.6* 4.8 4.8 4.5  CREATININE 11.58* 9.03* 6.57* 7.79* 4.61*  LATICACIDVEN  --   --   --   --  1.4    Estimated Creatinine Clearance: 8.4 mL/min (A) (by C-G formula based on SCr of 4.61 mg/dL (H)).    No Known Allergies  Antimicrobials this admission: Vanco 5/21>> Cefepime 5/21>>  Dose adjustments this admission: n/a  Microbiology results: 5/21 BCx: sent 5/21 UCx: sent   Bertis Ruddy, PharmD Clinical Pharmacist ED Pharmacist Phone # [phone] 03/17/2020 9:04 AM

## 2020-03-17 NOTE — ED Notes (Signed)
SWOT at bedside, waiting on RT to finish running the blood gas that was just drawn then pt will be transported to 4N.

## 2020-03-17 NOTE — Progress Notes (Signed)
Pulled ETT back 1CM per MD order.  ETT placement now 22CM at the Lip

## 2020-03-17 NOTE — ED Notes (Signed)
Preparing pt for intubation at this time, EDP Zackowski, RT this RN and [facility] at bedside.

## 2020-03-17 NOTE — Progress Notes (Signed)
eLink Physician-Brief Progress Note Patient Name: Bethany [name] DOB: 13-Nov-1945 MRN: [id number]   Date of Service  03/17/2020  HPI/Events of Note  Multiple orders for IV fluid. Nursing confused.   eICU Interventions  Will order: 1. D/C LR IV infusion (has K+ in it) 2. Decrease 0.9 NaCl to 10 mL/hour.      Intervention Category Major Interventions: Other:  Lysle Dingwall 03/17/2020, 9:32 PM
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
## 2020-03-17 NOTE — ED Notes (Signed)
IV team at bedside for second IV.

## 2020-03-17 NOTE — ED Triage Notes (Signed)
Pt BIB friend from home. Pt was found on the floor at home.   Pt in and out of consciousness on arrival, poor historian.  Oriented x4 VSS

## 2020-03-17 NOTE — ED Notes (Signed)
Pt began to fight against the ventilator & was waking up & reaching for her tube. ICU Dr came to bedside & ordered 10 of etomidate & continuous propofol has been ordered & started at 30 mg (per MD recommended at bedside).
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
## 2020-03-17 NOTE — ED Notes (Signed)
X-ray at bedside

## 2020-03-17 NOTE — ED Notes (Signed)
[facility] has been contacted & is going to transport this pt to [facility], RT has been made aware as well.

## 2020-03-18 ENCOUNTER — Inpatient Hospital Stay (HOSPITAL_COMMUNITY): Payer: Medicare Other

## 2020-03-18 LAB — BASIC METABOLIC PANEL
Anion gap: 15 (ref 5–15)
BUN: 21 mg/dL (ref 8–23)
CO2: 25 mmol/L (ref 22–32)
Calcium: 8.5 mg/dL — ABNORMAL LOW (ref 8.9–10.3)
Chloride: 100 mmol/L (ref 98–111)
Creatinine, Ser: 5.37 mg/dL — ABNORMAL HIGH (ref 0.44–1.00)
GFR calc Af Amer: 9 mL/min — ABNORMAL LOW (ref >60)
GFR calc non Af Amer: 7 mL/min — ABNORMAL LOW (ref >60)
Glucose, Bld: 66 mg/dL — ABNORMAL LOW (ref 70–99)
Potassium: 3.6 mmol/L (ref 3.5–5.1)
Sodium: 140 mmol/L (ref 135–145)

## 2020-03-18 LAB — POCT I-STAT 7, (LYTES, BLD GAS, ICA,H+H)
Acid-Base Excess: 7 mmol/L — ABNORMAL HIGH (ref 0.0–2.0)
Bicarbonate: 31.6 mmol/L — ABNORMAL HIGH (ref 20.0–28.0)
Calcium, Ion: 1.17 mmol/L (ref 1.15–1.40)
HCT: 40 % (ref 36.0–46.0)
Hemoglobin: 13.6 g/dL (ref 12.0–15.0)
O2 Saturation: 98 %
Patient temperature: 98.6
Potassium: 2.9 mmol/L — ABNORMAL LOW (ref 3.5–5.1)
Sodium: 137 mmol/L (ref 135–145)
TCO2: 33 mmol/L — ABNORMAL HIGH (ref 22–32)
pCO2 arterial: 43 mmHg (ref 32.0–48.0)
pH, Arterial: 7.475 — ABNORMAL HIGH (ref 7.350–7.450)
pO2, Arterial: 91 mmHg (ref 83.0–108.0)

## 2020-03-18 LAB — HEPATITIS B SURFACE ANTIGEN: Hepatitis B Surface Ag: NONREACTIVE

## 2020-03-18 LAB — CBC
HCT: 45.4 % (ref 36.0–46.0)
Hemoglobin: 13.3 g/dL (ref 12.0–15.0)
MCH: 26.7 pg (ref 26.0–34.0)
MCHC: 29.3 g/dL — ABNORMAL LOW (ref 30.0–36.0)
MCV: 91 fL (ref 80.0–100.0)
Platelets: 131 10*3/uL — ABNORMAL LOW (ref 150–400)
RBC: 4.99 MIL/uL (ref 3.87–5.11)
RDW: 18.8 % — ABNORMAL HIGH (ref 11.5–15.5)
WBC: 4.6 10*3/uL (ref 4.0–10.5)
nRBC: 0 % (ref 0.0–0.2)

## 2020-03-18 LAB — HEPATITIS B SURFACE ANTIBODY,QUALITATIVE: Hep B S Ab: REACTIVE — AB

## 2020-03-18 LAB — MAGNESIUM: Magnesium: 2.1 mg/dL (ref 1.7–2.4)

## 2020-03-18 LAB — GLUCOSE, CAPILLARY: Glucose-Capillary: 85 mg/dL (ref 70–99)

## 2020-03-18 LAB — PHOSPHORUS: Phosphorus: 4.9 mg/dL — ABNORMAL HIGH (ref 2.5–4.6)

## 2020-03-18 LAB — TRIGLYCERIDES: Triglycerides: 106 mg/dL (ref <150)

## 2020-03-18 IMAGING — DX DG CHEST 1V PORT
1 series · 1 of 1 positions shown · non-contrast
Comparison: [DATE]

CLINICAL DATA: Aspiration.

EXAM:
PORTABLE CHEST 1 VIEW

[chest]
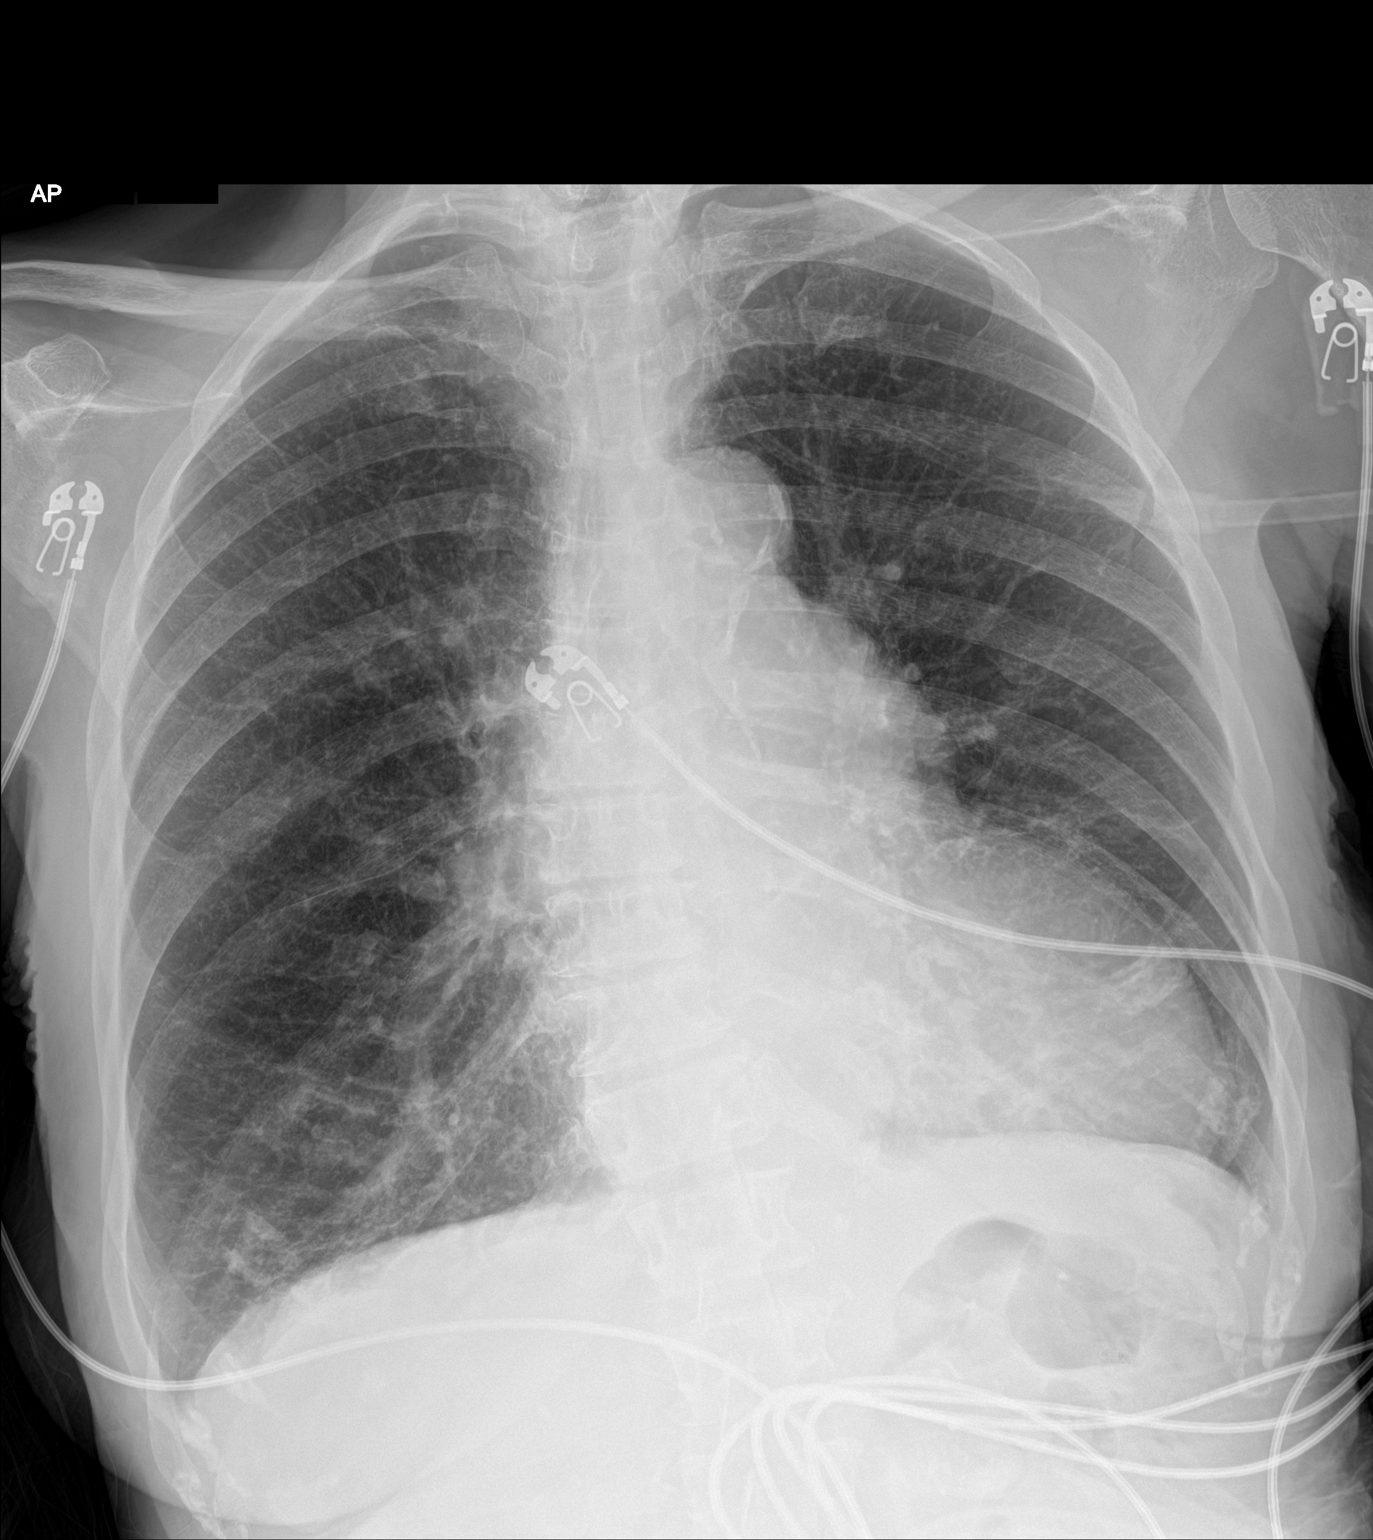

[1 of 1 positions shown; findings below may reference images not displayed]

FINDINGS: Cardiac silhouette is enlarged but stable from prior study. The
patient has been extubated. There is vascular congestion mild
interstitial edema. There is a persistent opacity at the left lung
base which has improved since the prior study. There is no
pneumothorax. There are small bilateral pleural effusions. Aortic
calcifications are noted. The main pulmonary artery is dilated.
IMPRESSION: 1. Improving airspace opacity at the left lung base.
2. Cardiomegaly with vascular congestion and mild interstitial
edema.
3. Small bilateral pleural effusions.
4. Prominence of the left hilum which may be secondary to a dilated
pulmonary artery. This is similar across multiple prior studies.

## 2020-03-18 IMAGING — DX DG CHEST 1V PORT
1 series · 1 of 1 positions shown · non-contrast
Comparison: [DATE]

CLINICAL DATA: Check endotracheal tube position

EXAM:
PORTABLE CHEST 1 VIEW

[chest]
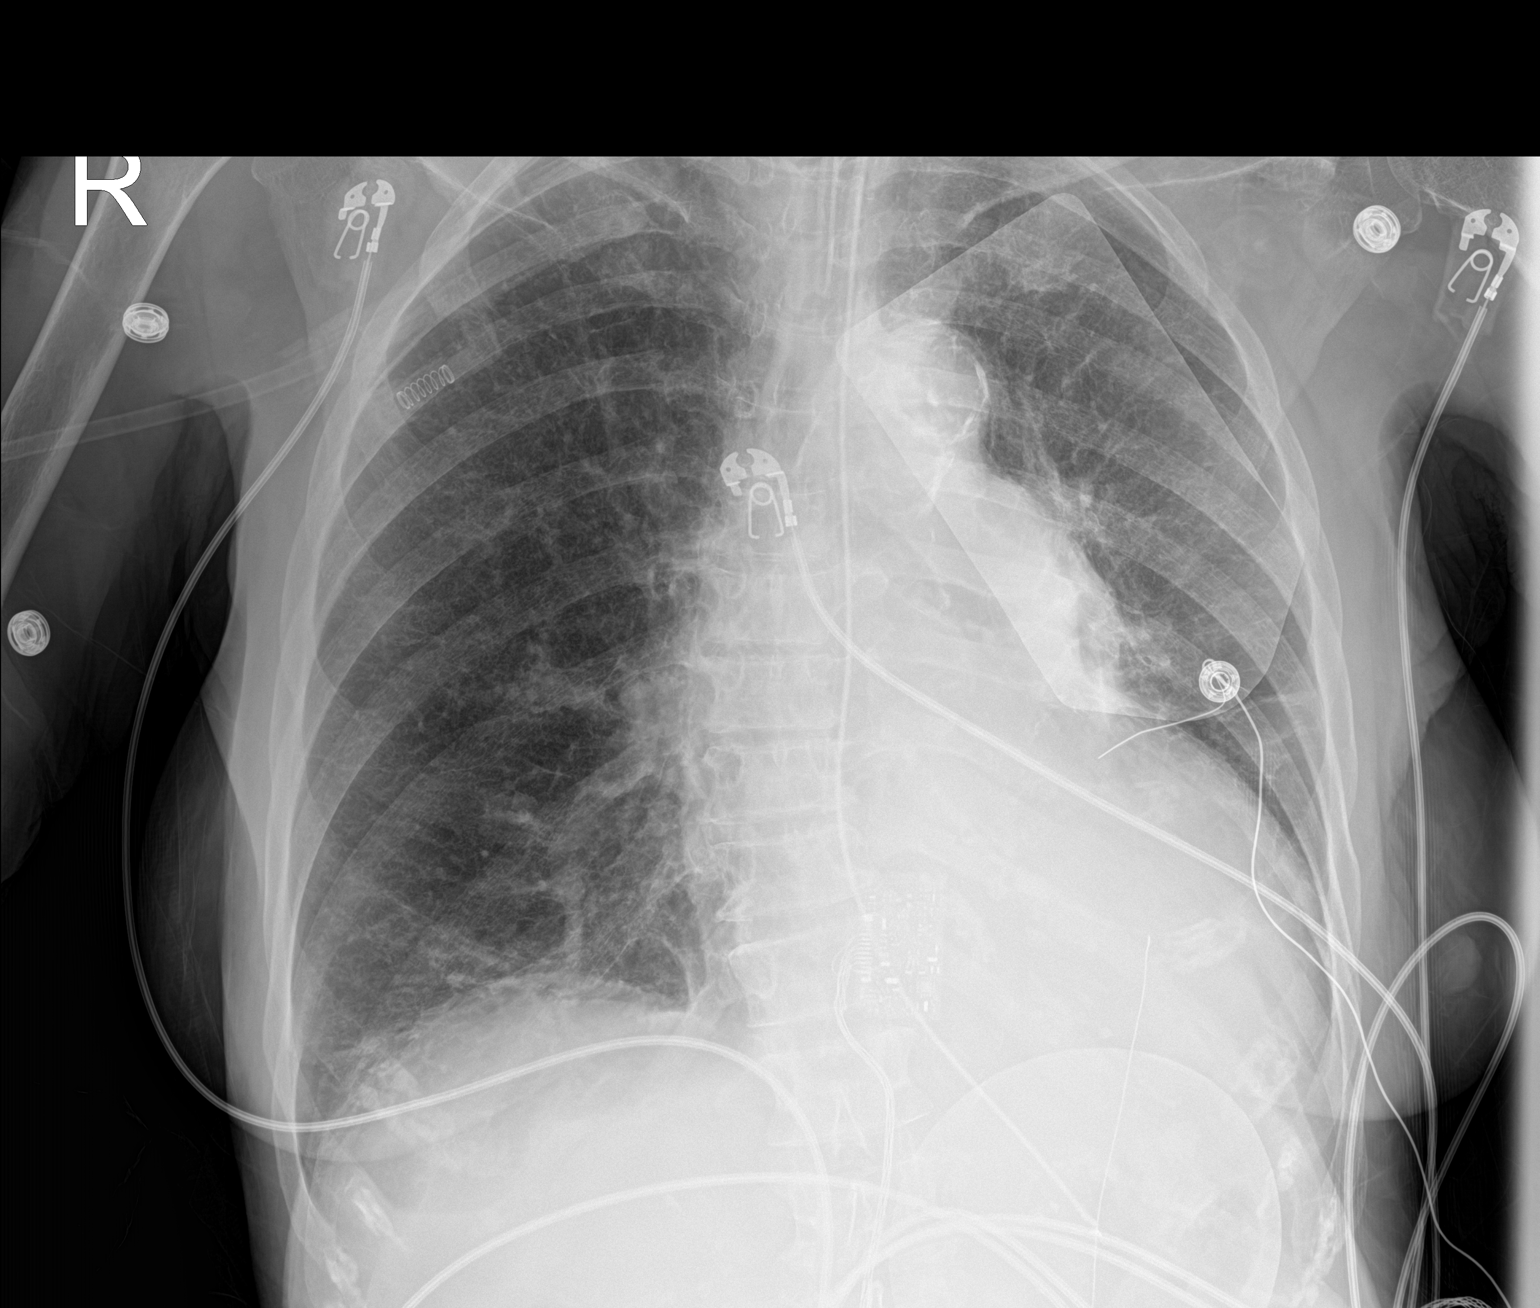

[1 of 1 positions shown; findings below may reference images not displayed]

FINDINGS: Cardiac shadow is stable. Aortic calcifications are again noted.
Endotracheal tube is been withdrawn and now lies 6 cm above the
carina. Gastric catheter extends with the tip into the stomach
although the proximal side port lies in the distal esophagus. This
could be advanced several cm into the stomach. Bibasilar opacities
are noted new from the prior exam left greater than right likely
related atelectasis. No bony abnormality is seen.
IMPRESSION: Tubes and lines as described above.

Increasing basilar opacity left greater than right as described.

## 2020-03-18 MED ORDER — CHLORHEXIDINE GLUCONATE CLOTH 2 % EX PADS
6.0000 | MEDICATED_PAD | Freq: Every day | CUTANEOUS | Status: DC
  Administered 2020-03-19 – 2020-03-22 (×3): 6 via TOPICAL

## 2020-03-18 MED ORDER — ORAL CARE MOUTH RINSE
15.0000 mL | Freq: Two times a day (BID) | OROMUCOSAL | Status: DC
  Administered 2020-03-19 (×2): 15 mL via OROMUCOSAL

## 2020-03-18 MED ORDER — SODIUM CHLORIDE 0.9 % IV SOLN
1.0000 g | INTRAVENOUS | Status: DC
  Administered 2020-03-18: 1 g via INTRAVENOUS
  Filled 2020-03-18 (×2): qty 1

## 2020-03-18 MED ORDER — CHLORHEXIDINE GLUCONATE 0.12 % MT SOLN
15.0000 mL | Freq: Two times a day (BID) | OROMUCOSAL | Status: DC
  Administered 2020-03-18 – 2020-03-19 (×3): 15 mL via OROMUCOSAL

## 2020-03-18 MED ORDER — METOPROLOL TARTRATE 5 MG/5ML IV SOLN
5.0000 mg | Freq: Four times a day (QID) | INTRAVENOUS | Status: DC
  Administered 2020-03-18 – 2020-03-20 (×5): 5 mg via INTRAVENOUS
  Filled 2020-03-18 (×7): qty 5

## 2020-03-18 MED ORDER — DILTIAZEM 12 MG/ML ORAL SUSPENSION
30.0000 mg | Freq: Four times a day (QID) | ORAL | Status: DC
  Administered 2020-03-20 – 2020-03-22 (×2): 30 mg via ORAL
  Filled 2020-03-18 (×18): qty 3

## 2020-03-18 MED ORDER — FUROSEMIDE 10 MG/ML IJ SOLN: 80.0000 mg | Freq: Two times a day (BID) | INTRAMUSCULAR | Status: DC

## 2020-03-18 NOTE — Progress Notes (Signed)
eLink Physician-Brief Progress Note Patient Name: Bethany [name] DOB: 1946-06-03 MRN: [id number]   Date of Service  03/18/2020  HPI/Events of Note  O2 requirement has increased from 3 to 7 liters/minute. Sat = 94% and RR = 19.   eICU Interventions  Plan: 1. Portable CXR STAT.     Intervention Category Major Interventions: Other:  Lysle Dingwall 03/18/2020, 10:15 PM

## 2020-03-18 NOTE — Procedures (Signed)
Extubation Procedure Note  Patient Details:   Name: Bethany [name] DOB: Sep 11, 1946 MRN: [id number]   Airway Documentation:    Vent end date: 03/18/20 Vent end time: 1143   Evaluation  O2 sats: stable throughout Complications: No apparent complications Patient did tolerate procedure well. Bilateral Breath Sounds: Clear, Diminished     Patient extubated to [place]. Vital signs stable at this time. No complications. RT will continue to monitor.   Yes  Mcneil Sober 03/18/2020, 11:48 AM

## 2020-03-18 NOTE — Consult Note (Addendum)
[facility] Renal Consultation Note    Indication for Consultation:  Management of ESRD/hemodialysis; anemia, hypertension/volume and secondary hyperparathyroidism  HPI: Bethany [name] is a 74 y.o. female with ESRD on HD, HTN, HLD, ESRD on HD, h/o substance abuse Recent admission 5/14-5/17 with acute encephalopathy and positive cocaine on UDS. Uremia from missed dialysis also felt to be contributing. Now admitted again with AMS/acute hypoxic respiratory failure. Per notes she was found down, altered at home. Hypothermic and lethargic on admission requiring intubation.  Labs: Na 140, K 3.6, BUN 21 Cr 5.37, TCO2 31, WBC 4.6 Hgb 13.3, Plt 131. Head CT neg. Increasing basilar opacities L>R on CXR.   Dialyzes TTS at [facility]. Frequently misses or cuts treatments short. She did go to dialysis on 5/18 and 5/20.  Received 3h 30 min of dialysis on Tuesday and  2h 45mn of dialysis on Thursday. She left at her dry weight.   Seen and examined in ICU. Intubated, restless on vent. Just completed dialysis this am. Dialysis nurse reports goal lowered due to hypotension.   Past Medical History:  Diagnosis Date  . Anemia   . Chronic kidney disease   . [place] disease[place]  . [place]  . Fall 10/11/2019  . Femoral hernia 06/30/2013   LEFT    . Heart murmur    as a child  . Hernia, femoral    left  . Hyperlipidemia    no meds aken  . Hypertension   . Pneumonia   . Smoking 10/21/2019   Past Surgical History:  Procedure Laterality Date  . AV FISTULA PLACEMENT Left 08/14/2018   Procedure: INSERTION OF ARTERIOVENOUS GRAFT LEFT ARM;  Surgeon: BSerafina Mitchell MD;  Location: [facility] OR;  Service: Vascular;  Laterality: Left;  . COLONOSCOPY    . TONSILLECTOMY AND ADENOIDECTOMY     at age 89 unsure if adenoids were taken out   Family History  Problem Relation Age of Onset  . Heart attack Brother   . Breast cancer Other   . Skin  cancer Other   . Colon cancer Neg Hx   . Pancreatic cancer Neg Hx   . Stomach cancer Neg Hx   . Esophageal cancer Neg Hx   . Rectal cancer Neg Hx    Social History:  reports that she has been smoking cigarettes. She has been smoking about 0.25 packs per day. She has never used smokeless tobacco. She reports current alcohol use of about 7.0 standard drinks of alcohol per week. She reports that she does not use drugs. No Known Allergies Prior to Admission medications   Medication Sig Start Date End Date Taking? Authorizing Provider  DILT-XR 180 MG 24 hr capsule Take 180 mg by mouth daily. 03/13/20   [provider]  diltiazem (CARDIZEM CD) 180 MG 24 hr capsule Take 1 capsule (180 mg total) by mouth daily. 03/14/20 06/12/20  [place] E[place]namarie Poag DO  hydrALAZINE (APRESOLINE) 50 MG tablet Take 1 tablet (50 mg total) by mouth every 8 (eight) hours. 03/13/20 06/11/20  [place] Eric J, DO  SENSIPAR 30 MG tablet Take 30 mg by mouth 2 (two) times daily. 03/16/20   [provider]   Current Facility-Administered Medications  Medication Dose Route Frequency Provider Last Rate Last Admin  . 0.9 %  sodium chloride infusion  1,000 mL Intravenous Continuous SAnders Simmonds MD 10 mL/hr at 03/18/20 1000 Rate Verify at 03/18/20 1000  . acetaminophen (TYLENOL) tablet 650  mg  650 mg Per Tube Q4H PRN Ollis, Brandi L, NP      . albuterol (PROVENTIL) (2.5 MG/3ML) 0.083% nebulizer solution 5 mg  5 mg Nebulization Once Muthersbaugh, Hannah, PA-C      . arformoterol (BROVANA) nebulizer solution 15 mcg  15 mcg Nebulization BID Ollis, Brandi L, NP      . budesonide (PULMICORT) nebulizer solution 0.5 mg  0.5 mg Nebulization BID Ollis, Brandi L, NP   0.5 mg at 03/18/20 0823  . ceFEPIme (MAXIPIME) 1 g in sodium chloride 0.9 % 100 mL IVPB  1 g Intravenous Q24H Rolla Flatten, [facility]      . chlorhexidine gluconate (MEDLINE KIT) (PERIDEX) 0.12 % solution 15 mL  15 mL Mouth Rinse BID Kipp Brood, MD   15 mL at  03/18/20 0825  . Chlorhexidine Gluconate Cloth 2 % PADS 6 each  6 each Topical Q0600 Roney Jaffe, MD   6 each at 03/18/20 [phone]  . diltiazem (CARDIZEM) 10 mg/ml oral suspension 30 mg  30 mg Per Tube Q6H Donita Brooks, NP   Stopped at 03/18/20 0603  . docusate (COLACE) 50 MG/5ML liquid 100 mg  100 mg Per Tube BID Kipp Brood, MD   100 mg at 03/18/20 0920  . docusate sodium (COLACE) capsule 100 mg  100 mg Oral BID PRN Ollis, Brandi L, NP      . fentaNYL (SUBLIMAZE) injection 25-100 mcg  25-100 mcg Intravenous Q30 min PRN Noe Gens L, NP   100 mcg at 03/18/20 0603  . heparin injection 5,000 Units  5,000 Units Subcutaneous Q8H Ollis, Brandi L, NP   5,000 Units at 03/18/20 0616  . ipratropium (ATROVENT) nebulizer solution 0.5 mg  0.5 mg Nebulization Once Muthersbaugh, Hannah, PA-C      . ipratropium-albuterol (DUONEB) 0.5-2.5 (3) MG/3ML nebulizer solution 3 mL  3 mL Nebulization Q4H PRN Ollis, Brandi L, NP      . MEDLINE mouth rinse  15 mL Mouth Rinse 10 times per day Noe Gens L, NP   15 mL at 03/18/20 0921  . multivitamin liquid 15 mL  15 mL Per Tube Daily Kipp Brood, MD   15 mL at 03/18/20 0921  . ondansetron (ZOFRAN) injection 4 mg  4 mg Intravenous Q6H PRN Ollis, Brandi L, NP   4 mg at 03/17/20 1730  . pantoprazole sodium (PROTONIX) 40 mg/20 mL oral suspension 40 mg  40 mg Per Tube Daily Ollis, Brandi L, NP   40 mg at 03/18/20 0920  . polyethylene glycol (MIRALAX / GLYCOLAX) packet 17 g  17 g Oral Daily PRN Ollis, Brandi L, NP      . polyethylene glycol (MIRALAX / GLYCOLAX) packet 17 g  17 g Oral Daily Ollis, Brandi L, NP      . propofol (DIPRIVAN) 1000 MG/100ML infusion  0-50 mcg/kg/min Intravenous Continuous Ollis, Brandi L, NP 4.33 mL/hr at 03/18/20 1000 15 mcg/kg/min at 03/18/20 1000  . vancomycin (VANCOREADY) IVPB 500 mg/100 mL  500 mg Intravenous Q T,Th,Sa-HD Bertis Ruddy, RPH         ROS: Unable to obtain d/t altered status   Physical Exam: Vitals:   03/18/20  0800 03/18/20 0824 03/18/20 0900 03/18/20 1000  BP: (!) 99/52 115/79 121/80 (!) 76/50  Pulse: (!) 59 63 60 63  Resp: 16  16 16   Temp:      TempSrc:      SpO2: 100%  100% 99%  Weight:      Height:  General: Frail appearing female, intubated in ICU  Head: NCAT sclera not icteric MMM Neck: Supple. No JVD appreciated  Lungs: CTA bilaterally without wheezes, rales, or rhonchi. Breathing is unlabored. Heart: RRR with S1 S2 Abdomen: BS+ soft non-tender  Lower extremities:without edema or ischemic changes, no open wounds  Neuro: Restless on vent, follows commands  Psych:  Intubated  Dialysis Access: LUE AVG in use on dialysis   Labs: Basic Metabolic Panel: Recent Labs  Lab 03/12/20 0205 03/12/20 0205 03/13/20 0221 03/17/20 0740 03/17/20 6269 03/17/20 4854 03/17/20 1544 03/18/20 0325 03/18/20 0425  NA 138   < > 136   < > 140   < > 138 137 140  K 4.1   < > 4.2   < > 3.0*   < > 3.4* 2.9* 3.6  CL 101   < > 100  --  94*  --   --   --  100  CO2 20*   < > 20*  --  32  --   --   --  25  GLUCOSE 82   < > 96  --  96  --   --   --  66*  BUN 19   < > 26*  --  15  --   --   --  21  CREATININE 6.57*   < > 7.79*  --  4.61*  --   --   --  5.37*  CALCIUM 8.7*   < > 8.7*  --  9.2  --   --   --  8.5*  PHOS 8.0*  --  8.2*  --   --   --   --   --  4.9*   < > = values in this interval not displayed.   Liver Function Tests: Recent Labs  Lab 03/12/20 0205 03/13/20 0221 03/17/20 0808  AST 26 26 27   ALT 15 15 17   ALKPHOS 61 61 66  BILITOT 0.9 0.7 0.9  PROT 6.0* 6.1* 6.4*  ALBUMIN 2.7* 2.9* 3.0*   Recent Labs  Lab 03/17/20 0808  LIPASE 24   Recent Labs  Lab 03/17/20 0808  AMMONIA 22   CBC: Recent Labs  Lab 03/12/20 0205 03/12/20 0205 03/13/20 0221 03/17/20 0740 03/17/20 0808 03/17/20 0808 03/17/20 1544 03/18/20 0325 03/18/20 0627  WBC 4.8   < > 4.8  --  4.5  --   --   --  4.6  NEUTROABS 2.6  --  2.5  --  2.9  --   --   --   --   HGB 12.7   < > 14.0   < > 13.7   <  > 13.6 13.6 13.3  HCT 40.4   < > 43.0   < > 45.8   < > 40.0 40.0 45.4  MCV 86.5  --  84.5  --  88.1  --   --   --  91.0  PLT 282   < > 302  --  277  --   --   --  131*   < > = values in this interval not displayed.   Cardiac Enzymes: Recent Labs  Lab 03/17/20 0808  CKTOTAL 258*   CBG: Recent Labs  Lab 03/12/20 1124 03/12/20 1609 03/12/20 2100 03/13/20 0614 03/17/20 0635  GLUCAP 94 107* 108* 107* 103*   Iron Studies: No results for input(s): IRON, TIBC, TRANSFERRIN, FERRITIN in the last 72 hours. Studies/Results: CT Head Wo Contrast  Result Date: 03/17/2020 CLINICAL  DATA:  Altered mental status. EXAM: CT HEAD WITHOUT CONTRAST TECHNIQUE: Contiguous axial images were obtained from the base of the skull through the vertex without intravenous contrast. COMPARISON:  03/09/2020. Brain MR dated 03/10/2020 FINDINGS: Brain: Stable mildly enlarged ventricles and cortical sulci. Mild-to-moderate patchy white matter low density in both cerebral hemispheres without significant change. No intracranial hemorrhage, mass lesion or CT evidence of acute infarction. Vascular: No hyperdense vessel or unexpected calcification. Skull: Normal. Negative for fracture or focal lesion. Sinuses/Orbits: Unremarkable. Other: None. IMPRESSION: 1. No acute abnormality. 2. Stable mild diffuse cerebral and cerebellar atrophy and mild to moderate chronic small vessel white matter ischemic changes in both cerebral hemispheres. Electronically Signed   By: Claudie Revering M.D.   On: 03/17/2020 12:38   DG Chest Port 1 View  Result Date: 03/18/2020 CLINICAL DATA:  Check endotracheal tube position EXAM: PORTABLE CHEST 1 VIEW COMPARISON:  03/17/2020 FINDINGS: Cardiac shadow is stable. Aortic calcifications are again noted. Endotracheal tube is been withdrawn and now lies 6 cm above the carina. Gastric catheter extends with the tip into the stomach although the proximal side port lies in the distal esophagus. This could be advanced  several cm into the stomach. Bibasilar opacities are noted new from the prior exam left greater than right likely related atelectasis. No bony abnormality is seen. IMPRESSION: Tubes and lines as described above. Increasing basilar opacity left greater than right as described. Electronically Signed   By: Inez Catalina M.D.   On: 03/18/2020 08:01   DG Chest Port 1 View  Result Date: 03/17/2020 CLINICAL DATA:  Endotracheal placement. EXAM: PORTABLE CHEST 1 VIEW COMPARISON:  Earlier same day. FINDINGS: Endotracheal tube is at the carina, directed towards the right mainstem bronchus. Orogastric or nasogastric tube enters the stomach. Mild interstitial edema is improved. Mild atelectasis present at the left lung base. IMPRESSION: Endotracheal tube at the carina, directed towards the right mainstem bronchus. Nasogastric or orogastric tube enters the stomach. Improved interstitial edema. Slight worsening of left base atelectasis. Electronically Signed   By: Nelson Chimes M.D.   On: 03/17/2020 14:35   DG Chest Port 1 View  Result Date: 03/17/2020 CLINICAL DATA:  Hypothermia and shortness of breath EXAM: PORTABLE CHEST 1 VIEW COMPARISON:  03/09/2020 FINDINGS: Mild cardiomegaly that is stable. Negative mediastinal contours. Chronic interstitial coarsening with mild streaky scarring or atelectasis at the bases. No effusion or pneumothorax. Dextroscoliosis. IMPRESSION: 1. Atelectasis or scarring on both sides. 2. Possible vascular congestion. Electronically Signed   By: Monte Fantasia M.D.   On: 03/17/2020 07:21    Dialysis Orders:  [facility] TTS 4H 180NRe 400/800 EDW 46.5 (kg)  2K/2Ca AVG No heparin Mircera 200 (last 5/1)  Calcitriol 1.0  OP Labs 5/18: Hgb 12.6 K 4.1 URR 80  Ca 9.3 P 5.8 PTH 1063   Assessment/Plan: 1. Acute encephalopathy - found down at home.  Head CT negative. EEG last week w/o seizure. Did not miss OP HD this week. Hx of substance abuse. Cultures pending -per primary.  2. Acute resp failure/Volume   - intubated per PCCM. Volume may be contributing. At EDW on 5/20 but  2.6kg over EDW by weights here with 1.3L removed. Consider extra HD Monday for volume.  3. ESRD -  TTS. Had HD today on schedule  4. Hypertension- Hypotensive this admission. Cardizem started prev admission.  5. Anemia  - Hgb 13. No ESA needs.  6. Metabolic bone disease -  Continue Ca acetate binder/Sensipar when eating   Thomos Lemons  Darrel Hoover [employer] Pager [phone] 03/18/2020, 11:31 AM

## 2020-03-18 NOTE — Progress Notes (Signed)
eLink Physician-Brief Progress Note Patient Name: Bethany [name] DOB: 1946/02/27 MRN: [id number]   Date of Service  03/18/2020  HPI/Events of Note  Increased O2 requirements. Now on 8 L/min [place] with at 95% Nonstop cough! CXR: 1. Improving airspace opacity at the left lung base. 2. Cardiomegaly with vascular congestion and mild interstitial Edema. 3. Small bilateral pleural effusions. 4. Prominence of the left hilum which may be secondary to a dilated pulmonary artery. This is similar across multiple prior studies  eICU Interventions  Will order: 1. Lasix 80 mg IV X 1. May or may not work. 2. PCCM ground team to evaluate at bedside.  3. Bedside to ask renal to review CXR and make recommendations for further fluid management/dialysis.     Intervention Category Major Interventions: Hypoxemia - evaluation and management  Ostin Mathey Cornelia Copa 03/18/2020, 10:58 PM

## 2020-03-19 LAB — URINE CULTURE: Culture: 20000 — AB

## 2020-03-19 LAB — TRIGLYCERIDES: Triglycerides: 100 mg/dL (ref <150)

## 2020-03-19 MED ORDER — SODIUM CHLORIDE 0.9 % IV SOLN
1.0000 g | INTRAVENOUS | Status: DC
  Administered 2020-03-19 – 2020-03-21 (×3): 1 g via INTRAVENOUS
  Filled 2020-03-19 (×2): qty 10
  Filled 2020-03-19 (×3): qty 1

## 2020-03-19 MED ORDER — IPRATROPIUM-ALBUTEROL 0.5-2.5 (3) MG/3ML IN SOLN
3.0000 mL | RESPIRATORY_TRACT | Status: DC
  Administered 2020-03-19 – 2020-03-20 (×7): 3 mL via RESPIRATORY_TRACT
  Filled 2020-03-19 (×6): qty 3

## 2020-03-19 MED ORDER — METHYLPREDNISOLONE SODIUM SUCC 125 MG IJ SOLR
60.0000 mg | Freq: Three times a day (TID) | INTRAMUSCULAR | Status: AC
  Administered 2020-03-19 (×3): 60 mg via INTRAVENOUS
  Filled 2020-03-19 (×3): qty 2

## 2020-03-19 MED ORDER — LORAZEPAM 2 MG/ML IJ SOLN
0.5000 mg | Freq: Once | INTRAMUSCULAR | Status: AC | PRN
  Administered 2020-03-20: 0.5 mg via INTRAVENOUS
  Filled 2020-03-19: qty 1

## 2020-03-19 MED ORDER — METHYLPREDNISOLONE SODIUM SUCC 40 MG IJ SOLR
40.0000 mg | Freq: Every day | INTRAMUSCULAR | Status: DC
  Administered 2020-03-20 – 2020-03-22 (×3): 40 mg via INTRAVENOUS
  Filled 2020-03-19 (×3): qty 1
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
## 2020-03-19 NOTE — Evaluation (Signed)
Clinical/Bedside Swallow Evaluation Patient Details  Name: Bethany [name] MRN: [id number] Date of Birth: Jul 02, 1946  Today's Date: 03/19/2020 Time: SLP Start Time (ACUTE ONLY): 5427 SLP Stop Time (ACUTE ONLY): 1008 SLP Time Calculation (min) (ACUTE ONLY): 10 min  Past Medical History:  Past Medical History:  Diagnosis Date  . Anemia   . Chronic kidney disease   . COPD (chronic obstructive pulmonary disease) ([place]) 10/21/2019  . ESRD on dialysis ([place]) 08/03/2018  . Fall 10/11/2019  . Femoral hernia 06/30/2013   LEFT    . Heart murmur    as a child  . Hernia, femoral    left  . Hyperlipidemia    no meds aken  . Hypertension   . Pneumonia   . Smoking 10/21/2019   Past Surgical History:  Past Surgical History:  Procedure Laterality Date  . AV FISTULA PLACEMENT Left 08/14/2018   Procedure: INSERTION OF ARTERIOVENOUS GRAFT LEFT ARM;  Surgeon: Serafina Mitchell, MD;  Location: [facility] OR;  Service: Vascular;  Laterality: Left;  . COLONOSCOPY    . TONSILLECTOMY AND ADENOIDECTOMY     at age 9- unsure if adenoids were taken out   HPI:  Bethany [name] is a 74 y.o. female with recent admission 5/14-5/17 with acute encephalopathy suspected due to uremia from missed dialysis. Now admitted again with AMS/acute hypoxic respiratory failure.  Hypothermic and lethargic on admission requiring intubation 5/21-5/22.     Assessment / Plan / Recommendation Clinical Impression   Pt presents with constant, dry coughing prior to PO intake.  Respiratory rate and heart rate were elevated indicating increased work of breathing and pt has had increased O2 requirements since extubation, including 10L via high flow nasal cannula.  Pt's oral phase was relatively automatic and efficient with small sips of thin liquids and pt had no overt s/s of aspiration.  Pt did have immediate coughing that did appear related to PO intake following larger sips of thin liquids.  Pt also had changes in the automaticity of swallow  response with pureed textures leading to oral holding and intermittent, inefficient sucking pattern to transport bolus posteriorly.  Cues were needed to clear residue and pt had immediate coughing following the use of a liquid wash.  As a result, I recommend that pt be allowed small amounts of water following oral care but remain NPO otherwise.  Prognosis for advancement is good with improved mentation, decreased O2 requirements, and more time post extubation.    SLP Visit Diagnosis: Dysphagia, oropharyngeal phase (R13.12)    Aspiration Risk  Moderate aspiration risk    Diet Recommendation Ice chips PRN after oral care;NPO   Compensations: Minimize environmental distractions;Slow rate;Small sips/bites Postural Changes: Seated upright at 90 degrees;Remain upright for at least 30 minutes after po intake    Other  Recommendations Oral Care Recommendations: Oral care QID;Oral care prior to ice chip/H20   Follow up Recommendations Other (comment)(to be determined)      Frequency and Duration min 2x/week          Prognosis Prognosis for Safe Diet Advancement: Good Barriers to Reach Goals: Cognitive deficits      Swallow Study   General HPI: [name] is a 74 y.o. female with recent admission 5/14-5/17 with acute encephalopathy suspected due to uremia from missed dialysis. Now admitted again with AMS/acute hypoxic respiratory failure.  Hypothermic and lethargic on admission requiring intubation 5/21-5/22.   Type of Study: Bedside Swallow Evaluation Previous Swallow Assessment: 10/21/2019 Diet Prior to this Study: NPO Temperature Spikes  Noted: No Respiratory Status: Nasal cannula History of Recent Intubation: Yes Length of Intubations (days): 2 days Date extubated: 03/18/20 Behavior/Cognition: Alert;Cooperative;Confused Oral Cavity Assessment: Dry Oral Care Completed by SLP: Yes Oral Cavity - Dentition: Edentulous Self-Feeding Abilities: Needs assist Patient Positioning:  Upright in bed Baseline Vocal Quality: Normal Volitional Cough: Cognitively unable to elicit Volitional Swallow: Unable to elicit    Oral/Motor/Sensory Function Overall Oral Motor/Sensory Function: Within functional limits   Ice Chips     Thin Liquid Thin Liquid: Impaired Pharyngeal  Phase Impairments: Cough - Immediate    Nectar Thick     Honey Thick     Puree Puree: Impaired Oral Phase Impairments: Poor awareness of bolus Oral Phase Functional Implications: Prolonged oral transit;Oral holding Pharyngeal Phase Impairments: Cough - Immediate   Solid            Dare Sanger, Elmyra Ricks L 03/19/2020,10:23 AM

## 2020-03-19 NOTE — Progress Notes (Addendum)
[facility] Progress Note  Subjective: did HD yest afternoon w/ 1.6 L off, did another session early am for ^wob/ heavy mucous secretions as equivalent for vol excess and she improved w/ the 2nd session 1.8 L off.   Vitals:   03/19/20 1000 03/19/20 1015 03/19/20 1100 03/19/20 1200  BP: (!) 88/67 92/65 (!) 85/73 117/88  Pulse: (!) 120 (!) 118 88 (!) 105  Resp: 19 (!) 21 (!) 23 16  Temp:    98.4 F (36.9 C)  TempSrc:    Oral  SpO2: 97% 97% 99% 100%  Weight:      Height:        Exam: General: Frail appearing female, confused, looks better, not coughing up as much mucous Head: NCAT sclera not icteric MMM Neck: Supple. No JVD appreciated  Lungs: CTA bilaterally without wheezes, rales, or rhonchi Heart: RRR with S1 S2 Abdomen: BS+ soft non-tender  Lower extremities:without edema or ischemic changes  Dialysis Access: LUE AVG +bruit     OP HD: [facility] TTS   4h  400/800  46.5kg  2/2 bath  AVG  Hep none  mircera 200 last 5/1  calc 1.0   OP labs 5/18: Hb 12.6  Ca 9.3  Phos 5.8  pth 1060   Assessment/ Plan: 1. Acute encephalopathy - found down at home.  Head CT negative. EEG last week w/o seizure. Did not miss OP HD this week. Hx of substance abuse. Cultures pending -per primary.  2. Acute resp failure/Volume  - intubated per PCCM. Volume prob contributing. Had 1.6L off Sat afternoon then re-dialyzed Sat evening for ^wob/ mucous secretions which seemed to improve w/ HD. No need for HD today. Per CCM has COPD component. Will reassess tomorrow.  3. ESRD -  TTS HD. Had HD yest x 2 here.   4. Hypertension- BP's up and down here. Cardizem started prev admission.  5. Anemia  - Hgb 13. No ESA needs.  6. Metabolic bone disease -  Continue Ca acetate binder/Sensipar when eating       Rob Medora Roorda 03/19/2020, 1:28 PM   Recent Labs  Lab 03/13/20 0221 03/17/20 0740 03/17/20 0808 03/17/20 1544 03/18/20 0325 03/18/20 0425 03/18/20 0627  K 4.2   < > 3.0*   < > 2.9* 3.6  --    BUN 26*  --  15  --   --  21  --   CREATININE 7.79*  --  4.61*  --   --  5.37*  --   CALCIUM 8.7*  --  9.2  --   --  8.5*  --   PHOS 8.2*  --   --   --   --  4.9*  --   HGB 14.0   < > 13.7   < > 13.6  --  13.3   < > = values in this interval not displayed.   Inpatient medications: . budesonide (PULMICORT) nebulizer solution  0.5 mg Nebulization BID  . chlorhexidine  15 mL Mouth Rinse BID  . Chlorhexidine Gluconate Cloth  6 each Topical Q0600  . Chlorhexidine Gluconate Cloth  6 each Topical Q0600  . diltiazem  30 mg Oral Q6H  . docusate  100 mg Per Tube BID  . heparin  5,000 Units Subcutaneous Q8H  . ipratropium-albuterol  3 mL Nebulization Q4H  . mouth rinse  15 mL Mouth Rinse q12n4p  . methylPREDNISolone (SOLU-MEDROL) injection  60 mg Intravenous Q8H   Followed by  . [START ON 03/20/2020] methylPREDNISolone (  SOLU-MEDROL) injection  40 mg Intravenous Daily  . metoprolol tartrate  5 mg Intravenous Q6H  . multivitamin  15 mL Per Tube Daily  . polyethylene glycol  17 g Oral Daily   . sodium chloride 10 mL/hr at 03/19/20 1200  . cefTRIAXone (ROCEPHIN)  IV     acetaminophen, docusate sodium, LORazepam, ondansetron (ZOFRAN) IV, polyethylene glycol

## 2020-03-19 NOTE — Progress Notes (Signed)
PCCM interval progress note:   Paged to see patient for increased coughing and O2 requirement, extubated yesterday after being intubated for encephalopathy, also with hx of substance abuse.  On arrival, pt is awake and answering questions, however appears delirious and slightly agitated.  Asks if she is dead.  She has a cough that sounds mostly upper airway and forced.  Seen by nephrology and does not appear convincingly volume-overloaded.  Has been NPO with with some clear secretions suctioned.  CXR with improving opacity L lung base, interstitial edema that appears largely unchanged from prior.  -Suspect she is delirious, possibly chronically aspirating. Nephrology may dialyze overnight if staffing available, though not overtly volume overloaded on exam -continue NPO -ABG if patient will tolerate -She is stable on [place] 10L, monitor mental and respiratory status overnight   Otilio Carpen Que Meneely, PA-C

## 2020-03-19 NOTE — Progress Notes (Signed)
   03/19/20 0545  Hand-Off documentation  Handoff Given Given to shift RN/LPN  Report given to (Full Name) Terence Lux, RN  Handoff Received Received from shift RN/LPN  Report received from (Full Name) Abishai Viegas, RN  Vital Signs  Temp 98.6 F (37 C)  Temp Source Axillary  Oxygen Therapy  O2 Device Nasal Cannula  O2 Flow Rate (L/min) 8 L/min  Pain Assessment  Pain Scale 0-10  Post-Hemodialysis Assessment  Rinseback Volume (mL) 250 mL  KECN 257 V  Dialyzer Clearance Lightly streaked  Duration of HD Treatment -hour(s) 2.5 hour(s)  Hemodialysis Intake (mL) 500 mL  UF Total -Machine (mL) 2287 mL  Net UF (mL) 1787 mL  Tolerated HD Treatment Yes  Post-Hemodialysis Comments pt breathng better and agitated less.  AVG/AVF Arterial Site Held (minutes) 10 minutes  AVG/AVF Venous Site Held (minutes) 10 minutes  Education / Care Plan  Dialysis Education Provided No (Comment)  Documented Education in Care Plan Yes  Outpatient Plan of Care Reviewed and on Chart Other (Comment)  Fistula / Graft Left Upper arm Arteriovenous vein graft  Placement Date/Time: 08/14/18 6002   Placed prior to admission: No  Orientation: Left  Access Location: Upper arm  Access Type: Arteriovenous vein graft  Expiration Date: 04/08/23  Site Condition No complications  Fistula / Graft Assessment Present;Thrill;Bruit  Status Deaccessed

## 2020-03-20 DIAGNOSIS — G9340 Encephalopathy, unspecified: Secondary | ICD-10-CM

## 2020-03-20 DIAGNOSIS — J9621 Acute and chronic respiratory failure with hypoxia: Secondary | ICD-10-CM

## 2020-03-20 DIAGNOSIS — N186 End stage renal disease: Secondary | ICD-10-CM

## 2020-03-20 LAB — MAGNESIUM: Magnesium: 2.1 mg/dL (ref 1.7–2.4)

## 2020-03-20 LAB — COMPREHENSIVE METABOLIC PANEL
ALT: 18 U/L (ref 0–44)
AST: 31 U/L (ref 15–41)
Albumin: 2.9 g/dL — ABNORMAL LOW (ref 3.5–5.0)
Alkaline Phosphatase: 61 U/L (ref 38–126)
Anion gap: 16 — ABNORMAL HIGH (ref 5–15)
BUN: 31 mg/dL — ABNORMAL HIGH (ref 8–23)
CO2: 26 mmol/L (ref 22–32)
Calcium: 9.4 mg/dL (ref 8.9–10.3)
Chloride: 97 mmol/L — ABNORMAL LOW (ref 98–111)
Creatinine, Ser: 5.05 mg/dL — ABNORMAL HIGH (ref 0.44–1.00)
GFR calc Af Amer: 9 mL/min — ABNORMAL LOW (ref >60)
GFR calc non Af Amer: 8 mL/min — ABNORMAL LOW (ref >60)
Glucose, Bld: 203 mg/dL — ABNORMAL HIGH (ref 70–99)
Potassium: 3.7 mmol/L (ref 3.5–5.1)
Sodium: 139 mmol/L (ref 135–145)
Total Bilirubin: 0.6 mg/dL (ref 0.3–1.2)
Total Protein: 7 g/dL (ref 6.5–8.1)

## 2020-03-20 LAB — CBC
HCT: 38.5 % (ref 36.0–46.0)
Hemoglobin: 11.7 g/dL — ABNORMAL LOW (ref 12.0–15.0)
MCH: 26.2 pg (ref 26.0–34.0)
MCHC: 30.4 g/dL (ref 30.0–36.0)
MCV: 86.1 fL (ref 80.0–100.0)
Platelets: 259 10*3/uL (ref 150–400)
RBC: 4.47 MIL/uL (ref 3.87–5.11)
RDW: 18.3 % — ABNORMAL HIGH (ref 11.5–15.5)
WBC: 13 10*3/uL — ABNORMAL HIGH (ref 4.0–10.5)
nRBC: 0 % (ref 0.0–0.2)

## 2020-03-20 LAB — PROCALCITONIN: Procalcitonin: 19.61 ng/mL

## 2020-03-20 LAB — PHOSPHORUS: Phosphorus: 5.9 mg/dL — ABNORMAL HIGH (ref 2.5–4.6)

## 2020-03-20 MED ORDER — IPRATROPIUM-ALBUTEROL 0.5-2.5 (3) MG/3ML IN SOLN: 3.0000 mL | RESPIRATORY_TRACT | Status: DC | PRN

## 2020-03-20 MED ORDER — CINACALCET HCL 30 MG PO TABS
60.0000 mg | ORAL_TABLET | Freq: Every day | ORAL | Status: DC
  Administered 2020-03-20 – 2020-03-22 (×3): 60 mg via ORAL
  Filled 2020-03-20 (×3): qty 2

## 2020-03-20 MED ORDER — ADULT MULTIVITAMIN W/MINERALS CH
1.0000 | ORAL_TABLET | Freq: Every day | ORAL | Status: DC
  Administered 2020-03-22: 1 via ORAL
  Filled 2020-03-20 (×2): qty 1
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
## 2020-03-20 NOTE — Progress Notes (Signed)
Subjective:  Last  hd sat evening  with  1787 cc uf , below edw now  (not sure stand wts) ,now no sob or any cos .   Objective Vital signs in last 24 hours: Vitals:   03/20/20 0600 03/20/20 0800 03/20/20 0814 03/20/20 0815  BP: 133/89 (!) 163/147    Pulse: 82  81   Resp: (!) 26 (!) 24 20   Temp:  98.2 F (36.8 C)    TempSrc:  Oral    SpO2: 100%  96% 96%  Weight:      Height:       Weight change:   Physical Exam: General: thin Chronically ill  Female, NAD ,has  [place] O2 on neck , pleasantly confused aware she had HD yesterday  Heart: RRR ,no rub  Lungs: decreased bs bilat bases otherwise CTA  Abdomen:soft, bsx4 active  Extremities:  no edema  HD Access : LUE AVF with +thrill / bruit.      OP HD: [facility] TTS   4h  400/800  46.5kg  2/2 bath  AVG  Hep none  mircera 200 last 5/1  calc 1.0   OP labs 5/18: Hb 12.6  Ca 9.3  Phos 5.8  pth 1060  Problem/Plan: 1. Acute encephalopathy - found down at home. Head CT negative. EEG last week w/o seizure. Did not miss OP HD this week. Hx of substance abuse. Cultures pending -per primary.  2. Acute resp failure/Volume - intubated per PCCM. Volume prob contributing and Per CCM has COPD component. Had 1.6L off Sat afternoon then re-dialyzed Sat evening for ^wob/ mucous secretions which seemed to improve w/ HD.1787 uf /  No need for HD today.Next hd tues  On schedule     3. ESRD -TTS HD. 4. Hypertension-BP's up and down here. Cardizem started prev admission. 5. Anemia - Hgb 13. No ESA needs. 6. Metabolic bone disease -Continue Ca acetate binder/Sensipar when eating 7. Severe copd /tob abuse - tells me this am "I am cutting back me cigarettes "  Ernest Haber, PA-C [place] [phone] 03/20/2020,10:21 AM  LOS: 3 days   Labs: Basic Metabolic Panel: Recent Labs  Lab 03/17/20 0808 03/17/20 0808 03/17/20 1544 03/18/20 0325 03/18/20 0425  NA 140   < > 138 137 140  K 3.0*   < > 3.4* 2.9* 3.6  CL 94*  --   --    --  100  CO2 32  --   --   --  25  GLUCOSE 96  --   --   --  66*  BUN 15  --   --   --  21  CREATININE 4.61*  --   --   --  5.37*  CALCIUM 9.2  --   --   --  8.5*  PHOS  --   --   --   --  4.9*   < > = values in this interval not displayed.   Liver Function Tests: Recent Labs  Lab 03/17/20 0808  AST 27  ALT 17  ALKPHOS 66  BILITOT 0.9  PROT 6.4*  ALBUMIN 3.0*   Recent Labs  Lab 03/17/20 0808  LIPASE 24   Recent Labs  Lab 03/17/20 0808  AMMONIA 22   CBC: Recent Labs  Lab 03/17/20 0808 03/17/20 0808 03/17/20 1544 03/18/20 0325 03/18/20 0627  WBC 4.5  --   --   --  4.6  NEUTROABS 2.9  --   --   --   --  HGB 13.7   < > 13.6 13.6 13.3  HCT 45.8   < > 40.0 40.0 45.4  MCV 88.1  --   --   --  91.0  PLT 277  --   --   --  131*   < > = values in this interval not displayed.   Cardiac Enzymes: Recent Labs  Lab 03/17/20 0808  CKTOTAL 258*   CBG: Recent Labs  Lab 03/17/20 0635 03/18/20 2204  GLUCAP 103* 85    Studies/Results: DG CHEST PORT 1 VIEW  Result Date: 03/18/2020 CLINICAL DATA:  Aspiration. EXAM: PORTABLE CHEST 1 VIEW COMPARISON:  Mar 18, 2020 FINDINGS: Cardiac silhouette is enlarged but stable from prior study. The patient has been extubated. There is vascular congestion mild interstitial edema. There is a persistent opacity at the left lung base which has improved since the prior study. There is no pneumothorax. There are small bilateral pleural effusions. Aortic calcifications are noted. The main pulmonary artery is dilated. IMPRESSION: 1. Improving airspace opacity at the left lung base. 2. Cardiomegaly with vascular congestion and mild interstitial edema. 3. Small bilateral pleural effusions. 4. Prominence of the left hilum which may be secondary to a dilated pulmonary artery. This is similar across multiple prior studies. Electronically Signed   By: Constance Holster M.D.   On: 03/18/2020 22:47   Medications: . sodium chloride 10 mL/hr at 03/20/20  0800  . cefTRIAXone (ROCEPHIN)  IV Stopped (03/19/20 2236)   . budesonide (PULMICORT) nebulizer solution  0.5 mg Nebulization BID  . Chlorhexidine Gluconate Cloth  6 each Topical Q0600  . Chlorhexidine Gluconate Cloth  6 each Topical Q0600  . diltiazem  30 mg Oral Q6H  . docusate  100 mg Per Tube BID  . heparin  5,000 Units Subcutaneous Q8H  . ipratropium-albuterol  3 mL Nebulization Q4H  . methylPREDNISolone (SOLU-MEDROL) injection  40 mg Intravenous Daily  . metoprolol tartrate  5 mg Intravenous Q6H  . multivitamin  15 mL Per Tube Daily  . polyethylene glycol  17 g Oral Daily

## 2020-03-20 NOTE — Progress Notes (Signed)
  Speech Language Pathology Treatment: Dysphagia  Patient Details Name: Bethany [name] MRN: [id number] DOB: 04/20/1946 Today's Date: 03/20/2020 Time: 8569-4370 SLP Time Calculation (min) (ACUTE ONLY): 15 min  Assessment / Plan / Recommendation Clinical Impression  Pt presents as alert, a little distractible, likely at baseline mentation. She is able to express preferences about foods and drink. When given ice water via straw with assist, pt again had immediate cough with poor coordination. She reproted a preference of room temp water. When SLP provided tepid water without a straw, pt picked up cup and took single sips without difficulty. When eating bites of puree or cookie, pt talked a bit during oral phase and held bolus for a bit, but then completed swallow without difficulty. Mastication with gums is adequate. Pt does better when independent. Recommend upgrade to regular thin. No SLP f/u needed.   HPI HPI: Bethany [name] is a 74 y.o. female with recent admission 5/14-5/17 with acute encephalopathy suspected due to uremia from missed dialysis. Now admitted again with AMS/acute hypoxic respiratory failure.  Hypothermic and lethargic on admission requiring intubation 5/21-5/22.        SLP Plan  Continue with current plan of care       Recommendations  Diet recommendations: Regular;Thin liquid Medication Administration: Whole meds with puree Compensations: Minimize environmental distractions;Slow rate;Small sips/bites Postural Changes and/or Swallow Maneuvers: Seated upright 90 degrees                Oral Care Recommendations: Oral care QID;Oral care prior to ice chip/H20 SLP Visit Diagnosis: Dysphagia, oropharyngeal phase (R13.12) Plan: Continue with current plan of care       GO                Bethany [name], Bethany [name] 03/20/2020, 10:19 AM

## 2020-03-21 DIAGNOSIS — F1910 Other psychoactive substance abuse, uncomplicated: Secondary | ICD-10-CM

## 2020-03-21 DIAGNOSIS — G92 Toxic encephalopathy: Secondary | ICD-10-CM

## 2020-03-21 DIAGNOSIS — J9601 Acute respiratory failure with hypoxia: Secondary | ICD-10-CM

## 2020-03-21 DIAGNOSIS — I10 Essential (primary) hypertension: Secondary | ICD-10-CM

## 2020-03-21 DIAGNOSIS — G929 Unspecified toxic encephalopathy: Secondary | ICD-10-CM

## 2020-03-21 LAB — CBC
HCT: 35.5 % — ABNORMAL LOW (ref 36.0–46.0)
Hemoglobin: 11 g/dL — ABNORMAL LOW (ref 12.0–15.0)
MCH: 26.4 pg (ref 26.0–34.0)
MCHC: 31 g/dL (ref 30.0–36.0)
MCV: 85.3 fL (ref 80.0–100.0)
Platelets: 274 10*3/uL (ref 150–400)
RBC: 4.16 MIL/uL (ref 3.87–5.11)
RDW: 18.1 % — ABNORMAL HIGH (ref 11.5–15.5)
WBC: 12.3 10*3/uL — ABNORMAL HIGH (ref 4.0–10.5)
nRBC: 0 % (ref 0.0–0.2)

## 2020-03-21 LAB — BASIC METABOLIC PANEL
Anion gap: 17 — ABNORMAL HIGH (ref 5–15)
BUN: 44 mg/dL — ABNORMAL HIGH (ref 8–23)
CO2: 24 mmol/L (ref 22–32)
Calcium: 8.5 mg/dL — ABNORMAL LOW (ref 8.9–10.3)
Chloride: 96 mmol/L — ABNORMAL LOW (ref 98–111)
Creatinine, Ser: 5.91 mg/dL — ABNORMAL HIGH (ref 0.44–1.00)
GFR calc Af Amer: 8 mL/min — ABNORMAL LOW (ref >60)
GFR calc non Af Amer: 7 mL/min — ABNORMAL LOW (ref >60)
Glucose, Bld: 147 mg/dL — ABNORMAL HIGH (ref 70–99)
Potassium: 4.4 mmol/L (ref 3.5–5.1)
Sodium: 137 mmol/L (ref 135–145)

## 2020-03-21 LAB — PROCALCITONIN: Procalcitonin: 20.62 ng/mL

## 2020-03-21 MED ORDER — CALCIUM ACETATE (PHOS BINDER) 667 MG PO CAPS
667.0000 mg | ORAL_CAPSULE | Freq: Three times a day (TID) | ORAL | Status: DC
  Administered 2020-03-21 – 2020-03-22 (×3): 667 mg via ORAL
  Filled 2020-03-21 (×4): qty 1

## 2020-03-21 MED ORDER — ORAL CARE MOUTH RINSE
15.0000 mL | Freq: Two times a day (BID) | OROMUCOSAL | Status: DC
  Administered 2020-03-21 – 2020-03-22 (×2): 15 mL via OROMUCOSAL

## 2020-03-21 NOTE — Progress Notes (Signed)
Subjective:  No cos  Sitting  In bedside chair , HD pending today   Objective Vital signs in last 24 hours: Vitals:   03/21/20 0344 03/21/20 0400 03/21/20 0500 03/21/20 1116  BP: 99/79 95/75    Pulse: 75 71    Resp: 13 20    Temp: 98.5 F (36.9 C) 98.5 F (36.9 C)    TempSrc: Oral Oral    SpO2: 95% 96%  96%  Weight:   44.8 kg   Height:       Weight change:   Physical Exam: General:  Chronically ill  thin Female, NAD , more alert less confused ,aware today is Tues  = HD DAY Heart: RRR ,no rub  Lungs: decreased bs bilat bases otherwise CTA  Abdomen: BS +, soft, NT ,ND  Extremities:  no pedal  edema  HD Access : LUE AVF with +thrill / bruit.    OP HD:[facility] TTS 4h 400/800 46.5kg 2/2 bath AVG Hep none mircera 200 last 5/1 calc 1.0 OP labs 5/18: Hb 12.6 Ca 9.3 Phos 5.8 pth 1060  Problem/Plan: 1. Acute encephalopathy - found down at home. Head CT negative. EEG last week w/o seizure. Did not miss OP HD this week. Hx of substance abuse. Cultures pending -per primary.  2. Acute resp failure/Volume - intubated per PCCM. Volumeprob contributing and Per CCM has COPD component. Had 1.6L off Sat afternoon then re-dialyzed Sat evening for ^wob/ mucous secretions which seemed to improve w/ HD.1787 uf /  HD today On schedule    3. ESRD -TTSHD. k 4.4  4. Hypertension-BP's up and down here. Cardizem started prev admission. 5. Anemia - Hgb 11.0 . No ESA needs. 6. Metabolic bone disease -Continue Ca acetate binder/Sensipar  Now  eating 7. Severe copd /tob abuse - tells me "I am cutting back my  cigarettes "  Ernest Haber, PA-C [place] [phone] 03/21/2020,12:01 PM  LOS: 4 days   Labs: Basic Metabolic Panel: Recent Labs  Lab 03/18/20 0425 03/20/20 1116 03/21/20 0155  NA 140 139 137  K 3.6 3.7 4.4  CL 100 97* 96*  CO2 $Re'25 26 24  'TPI$ GLUCOSE 66* 203* 147*  BUN 21 31* 44*  CREATININE 5.37* 5.05* 5.91*  CALCIUM 8.5* 9.4 8.5*   PHOS 4.9* 5.9*  --    Liver Function Tests: Recent Labs  Lab 03/17/20 0808 03/20/20 1116  AST 27 31  ALT 17 18  ALKPHOS 66 61  BILITOT 0.9 0.6  PROT 6.4* 7.0  ALBUMIN 3.0* 2.9*   Recent Labs  Lab 03/17/20 0808  LIPASE 24   Recent Labs  Lab 03/17/20 0808  AMMONIA 22   CBC: Recent Labs  Lab 03/17/20 0808 03/17/20 1544 03/18/20 0627 03/20/20 1116 03/21/20 0155  WBC 4.5  --  4.6 13.0* 12.3*  NEUTROABS 2.9  --   --   --   --   HGB 13.7   < > 13.3 11.7* 11.0*  HCT 45.8   < > 45.4 38.5 35.5*  MCV 88.1  --  91.0 86.1 85.3  PLT 277  --  131* 259 274   < > = values in this interval not displayed.   Cardiac Enzymes: Recent Labs  Lab 03/17/20 0808  CKTOTAL 258*   CBG: Recent Labs  Lab 03/17/20 0635 03/18/20 2204  GLUCAP 103* 85    Studies/Results: No results found. Medications: . sodium chloride 10 mL/hr at 03/20/20 1600  . cefTRIAXone (ROCEPHIN)  IV 1 g (03/20/20 2059)   .  budesonide (PULMICORT) nebulizer solution  0.5 mg Nebulization BID  . Chlorhexidine Gluconate Cloth  6 each Topical Q0600  . Chlorhexidine Gluconate Cloth  6 each Topical Q0600  . cinacalcet  60 mg Oral Q breakfast  . diltiazem  30 mg Oral Q6H  . docusate  100 mg Per Tube BID  . heparin  5,000 Units Subcutaneous Q8H  . mouth rinse  15 mL Mouth Rinse BID  . methylPREDNISolone (SOLU-MEDROL) injection  40 mg Intravenous Daily  . metoprolol tartrate  5 mg Intravenous Q6H  . multivitamin with minerals  1 tablet Oral Daily  . polyethylene glycol  17 g Oral Daily
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
## 2020-03-21 NOTE — Progress Notes (Signed)
Patient left unit for dialysis at this time. Alert and in stable condition. Report given to receiving nurse Kateri Mc, RN with all questions answered. Left unit via bed.

## 2020-03-22 LAB — CULTURE, BLOOD (ROUTINE X 2)
Culture: NO GROWTH
Culture: NO GROWTH
Special Requests: ADEQUATE
Special Requests: ADEQUATE

## 2020-03-22 MED ORDER — ALBUTEROL SULFATE HFA 108 (90 BASE) MCG/ACT IN AERS
2.0000 | INHALATION_SPRAY | Freq: Four times a day (QID) | RESPIRATORY_TRACT | 0 refills | Status: AC | PRN
Start: 2020-03-22 — End: ?
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
## 2020-03-22 NOTE — Discharge Summary (Signed)
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 COMPARISON:  MRI 10/24/2019, CT 10/24/2019 FINDINGS: CT HEAD FINDINGS Brain: No evidence of acute infarction, hemorrhage, hydrocephalus, extra-axial collection or mass lesion/mass effect. Symmetric prominence of the ventricles, cisterns and sulci compatible with parenchymal volume loss. Patchy areas of white matter hypoattenuation are most compatible with chronic microvascular angiopathy. Vascular: Atherosclerotic calcification of the carotid siphons and intradural vertebral arteries. No hyperdense vessel. Skull: No calvarial fracture or suspicious osseous lesion. No scalp swelling or hematoma. Other: None CT MAXILLOFACIAL FINDINGS Osseous: No fracture of the bony orbits. Mild bilateral deformity of the nasal bones without overlying swelling favoring remote finding. No other mid face fractures are  seen. The pterygoid plates are intact. The mandible is intact. 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 No temporal bone fractures are identified. Patient is edentulous.  Orbits: No retro septal gas, stranding or hemorrhage. The globes appear normal and symmetric. Symmetric appearance of the extraocular musculature and optic nerve sheath complexes. Normal caliber of the superior ophthalmic veins. Sinuses: Paranasal sinuses are predominantly clear. Slight rightward nasal septal deviation with a right-sided nasal septal spur. Mastoid air cells are well aerated. Middle ear cavities are clear. Ossicular chains appear normally configured. Pneumatization of the petrous apices. Soft tissues: Mild left supraorbital soft tissue thickening. No large hematoma, soft tissue gas or foreign body. Cervical carotid and vertebral artery atherosclerosis. Limited cervical: Craniocervical atlantoaxial articulations are normally aligned. Reversal the normal cervical lordosis. No traumatic listhesis. No visible cervical spine fracture. Multilevel cervical spondylitic changes are seen throughout the included portions of the cervical spine most pronounced at the C5-C7 levels. Bony fusion of the C4-5 articular facets is noted. No abnormal facet widening, perched or jumped facets. Small degenerative os odontoideum. IMPRESSION: 1. No acute intracranial abnormality. 2. Mild to moderate parenchymal volume loss and chronic microvascular angiopathy changes. 3. Mild left supraorbital soft tissue thickening without large hematoma, soft tissue gas or foreign body. 4. Mild bilateral deformity of the nasal bones without overlying swelling favoring remote finding. Correlate for point tenderness. 5. Multilevel cervical spondylitic changes most pronounced at the C5-C7 levels or sclerosis is favored to be degenerative in the absence of infectious symptoms. 6. Cervical and vertebral artery atherosclerosis. 7. 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 Electronically Signed   By: Lovena Le M.D.   On: 03/09/2020 19:08    Microbiology: Recent Results (from the past 240  hour(s))  Blood Culture (routine x 2)     Status: None (Preliminary result)   Collection Time: 03/17/20  8:08 AM   Specimen: BLOOD RIGHT ARM  Result Value Ref Range Status   Specimen Description BLOOD RIGHT ARM  Final   Special Requests   Final    BOTTLES DRAWN AEROBIC AND ANAEROBIC Blood Culture adequate volume   Culture   Final    NO GROWTH 4 DAYS Performed at [facility], [address] [place], [place] [address]    Report Status PENDING  Incomplete  SARS Coronavirus 2 by RT PCR (hospital order, performed in [facility] hospital lab) Nasopharyngeal Nasopharyngeal Swab     Status: None   Collection Time: 03/17/20  8:18 AM   Specimen: Nasopharyngeal Swab  Result Value Ref Range Status   SARS Coronavirus 2 NEGATIVE NEGATIVE Final    Comment: (NOTE) SARS-CoV-2 target nucleic acids are NOT DETECTED. The SARS-CoV-2 RNA is generally detectable in upper and lower respiratory specimens during the acute phase of infection. The lowest concentration of SARS-CoV-2 viral copies this assay can detect is 250 copies / mL. A negative result does not preclude SARS-CoV-2 infection and should not be used as the sole basis for treatment or other patient management decisions.  A negative result may occur with improper specimen collection / handling, submission of specimen other than nasopharyngeal swab, presence of viral mutation(s) within the areas targeted by this assay, and inadequate number of viral copies (<250 copies / mL). A negative result must be combined with clinical observations, patient history, and epidemiological information. Fact Sheet for Patients:   StrictlyIdeas.no Fact Sheet for Healthcare Providers: BankingDealers.co.za This test  is not yet approved or cleared  by the [place] and has been authorized for detection and/or diagnosis of SARS-CoV-2 by FDA under an Emergency Use Authorization (EUA).  This EUA will  remain in effect (meaning this test can be used) for the duration of the COVID-19 declaration under Section 564(b)(1) of the Act, 21 U.S.C. section 360bbb-3(b)(1), unless the authorization is terminated or revoked sooner. Performed at [facility], [place] [address], [place], [place] [address]   Blood Culture (routine x 2)     Status: None (Preliminary result)   Collection Time: 03/17/20  9:01 AM   Specimen: BLOOD RIGHT HAND  Result Value Ref Range Status   Specimen Description BLOOD RIGHT HAND  Final   Special Requests   Final    BOTTLES DRAWN AEROBIC AND ANAEROBIC Blood Culture adequate volume   Culture   Final    NO GROWTH 4 DAYS Performed at [facility], [place] [address], [place], [place] [address]    Report Status PENDING  Incomplete  Urine culture     Status: Abnormal   Collection Time: 03/17/20  9:17 AM   Specimen: In/Out Cath Urine  Result Value Ref Range Status   Specimen Description IN/OUT CATH URINE  Final   Special Requests   Final    NONE Performed at [facility], [place] [address] [place], [place] [address]    Culture (A)  Final    20,000 COLONIES/mL DIPHTHEROIDS(CORYNEBACTERIUM SPECIES)   Report Status 03/19/2020 FINAL  Final     Labs: Basic Metabolic Panel: Recent Labs  Lab 03/17/20 0808 03/17/20 0808 03/17/20 1544 03/18/20 0325 03/18/20 0425 03/20/20 1116 03/21/20 0155  NA 140   < > 138 137 140 139 137  K 3.0*   < > 3.4* 2.9* 3.6 3.7 4.4  CL 94*  --   --   --  100 97* 96*  CO2 32  --   --   --  $R'25 26 24  'GE$ GLUCOSE 96  --   --   --  66* 203* 147*  BUN 15  --   --   --  21 31* 44*  CREATININE 4.61*  --   --   --  5.37* 5.05* 5.91*  CALCIUM 9.2  --   --   --  8.5* 9.4 8.5*  MG  --   --   --   --  2.1 2.1  --   PHOS  --   --   --   --  4.9* 5.9*  --    < > = values in this interval not displayed.   Liver Function Tests: Recent Labs  Lab 03/17/20 0808 03/20/20 1116  AST 27 31  ALT 17 18  ALKPHOS 66 61  BILITOT 0.9 0.6  PROT 6.4*  7.0  ALBUMIN 3.0* 2.9*   Recent Labs  Lab 03/17/20 0808  LIPASE 24   Recent Labs  Lab 03/17/20 0808  AMMONIA 22   CBC: Recent Labs  Lab 03/17/20 0808 03/17/20 0808 03/17/20 1544 03/18/20 0325 03/18/20 0627 03/20/20 1116 03/21/20 0155  WBC 4.5  --   --   --  4.6 13.0* 12.3*  NEUTROABS 2.9  --   --   --   --   --   --   HGB 13.7   < > 13.6 13.6 13.3 11.7* 11.0*  HCT 45.8   < > 40.0 40.0 45.4 38.5 35.5*  MCV 88.1  --   --   --  91.0 86.1 85.3  PLT 277  --   --   --  131* 259 274   < > = values in this interval not displayed.   Cardiac Enzymes: Recent Labs  Lab 03/17/20 0808  CKTOTAL 258*   BNP: BNP (last 3 results) Recent Labs    12/24/19 0858 03/09/20 1616 03/17/20 0808  BNP >4,500.0* >4,500.0* 1,690.1*    ProBNP (last 3 results) No results for input(s): PROBNP in the last 8760 hours.  CBG: Recent Labs  Lab 03/17/20 0635 03/18/20 2204  GLUCAP 103* 85       Signed:  Florencia Reasons MD, PhD, FACP  [facility] 03/22/2020, 10:51 AM

## 2020-03-22 NOTE — TOC Progression Note (Signed)
Transition of Care [facility]) - Progression Note    Patient Details  Name: Naba Sneed MRN: [id number] Date of Birth: 1946-03-05  Transition of Care [facility]) CM/SW [place], [place] Phone Number: 03/22/2020, 3:08 PM  Clinical Narrative:     CSW responded to transport consult. [place] waiver and release of liability form signed by pt. Arranged transport with  Government social research officer. Transport scheduled for 1523. Nurse notified and will be contacted when transport arrives for pick up.   Expected Discharge Plan: [place] Barriers to Discharge: No Barriers Identified  Expected Discharge Plan and Services Expected Discharge Plan: [place]   Discharge Planning Services: CM Consult Post Acute Care Choice: Mobile arrangements for the past 2 months: Apartment Expected Discharge Date: 03/22/20               DME Arranged: N/A DME Agency: NA       HH Arranged: PT HH Agency: [place] Date [place]: 03/22/20 Time [place]: 1115 Representative spoke with at [place]: [place] ([place]) Interventions    Readmission Risk Interventions Readmission Risk Prevention Plan 03/22/2020  Transportation Screening Complete  Medication Review Press photographer) Complete  PCP or Specialist appointment within 3-5 days of discharge Complete  HRI or [place] Complete  SW Recovery Care/Counseling Consult Complete  [place] Not Applicable  Some recent data might be hidden

## 2020-03-22 NOTE — TOC Transition Note (Signed)
Transition of Care [facility]) - CM/SW Discharge Note Marvetta Gibbons RN,BSN Transitions of Care Unit 4NP (non trauma) - RN Case Manager [phone]   Patient Details  Name: Bethany [name] MRN: [id number] Date of Birth: Aug 12, 1946  Transition of Care [name]) CM/SW Contact:  Dawayne Patricia, RN Phone Number: 03/22/2020, 12:28 PM   Clinical Narrative:    Pt stable for transition home, orders placed for HHRN/PT/OT, spoke with pt at bedside- discussed recommendation for [facility] services- pt agreeable to short term HH. List provided to pt for [facility]  Choice Per CMS guidelines from medicare.gov website with star ratings (copy placed in shadow chart)- per pt she would like to use [facility] for [facility] needs and if they are unable to service then she states she has no preference. Per pt she states that she no longer plans to stay with sister- she wants to return home to her apartment. Address and phone # along with PCP all confirmed with pt in epic. Pt reports she is going to see if her friend Carlyon Shadow can transport home- if not she will let bedside RN know and transportation can be arranged.   Call made to [facility] with Alvis Lemmings for [facility] needs- due to staffing and upcoming holiday- RN and OT can not start until next week- HHPT can see pt this week. Spoke with MD who is agreeable to hold off on RN/OT and modify order to just HHPT- Alvis Lemmings will add RN/OT if needed when staffing allows.      Final next level of care: Home w Home Health Services Barriers to Discharge: No Barriers Identified   Patient Goals and CMS Choice Patient states their goals for this hospitalization and ongoing recovery are:: would love to get better and get a part time job CMS Medicare.gov Compare Post Acute Care list provided to:: Patient Choice offered to / list presented to : Patient  Discharge Placement                 Home with [facility]      Discharge Plan and Services   Discharge Planning Services: CM Consult Post Acute Care Choice: Home  Health          DME Arranged: N/A DME Agency: NA       HH Arranged: PT HH Agency: [place] Date [facility] Agency Contacted: 03/22/20 Time [place]: 1115 Representative spoke with at [place]: [place] ([place]) Interventions     Readmission Risk Interventions Readmission Risk Prevention Plan 03/22/2020  Transportation Screening Complete  Medication Review Press photographer) Complete  PCP or Specialist appointment within 3-5 days of discharge Complete  HRI or [place] Complete  SW Recovery Care/Counseling Consult Complete  [place] Not Applicable  Some recent data might be hidden

## 2020-03-22 NOTE — Progress Notes (Signed)
Patient was given discharge papers and educated. Patient verbalized understanding of the discharge information. IV was removed from the right arm patient tolerated well. Medications where covered with the patient. All questions have been answered and patients discharge is complete.

## 2020-03-22 NOTE — Progress Notes (Signed)
In preporation for D/C I reached out to D. Williams to inquire about a pick up time. D.Williams informed me she would not be picking the patient up that her sister Bethany [name] would be picking the patient up. I called the sister and no answer, a message was left. Waiting for a response from sister at this time.

## 2020-03-22 NOTE — Progress Notes (Signed)
Subjective:  states going home , no cos   Objective Vital signs in last 24 hours: Vitals:   03/22/20 0352 03/22/20 0500 03/22/20 0803 03/22/20 0810  BP: 121/74   122/88  Pulse: 79  74 74  Resp: (!) $RemoveB'22  16 12  'SbNowrNJ$ Temp: 98.6 F (37 C)   98.2 F (36.8 C)  TempSrc: Oral   Oral  SpO2: 95%   97%  Weight:  44.8 kg    Height:       Weight change: -0.3 kg  Physical Exam: General: Chronically ill thin Female, NAD , more alert less confused ,aware today is wed ,had hd yest .   Heart:RRR ,no rub Lungs:decreased bs bilat bases otherwise CTA Abdomen: BS +, soft, NT ,ND  Extremities: no pedal  edema  HD Access :LUE AVF with +thrill / bruit.   OP HD:[facility] TTS 4h 400/800 46.5kg 2/2 bath AVG Hep none mircera 200 last 5/1 calc 1.0 OP labs 5/18: Hb 12.6 Ca 9.3 Phos 5.8 pth 1060  Problem/Plan: 1. Acute encephalopathy - found down at home. Head CT negative. EEG last week w/o seizure. Did not miss OP HD this week. Hx of substance abuse. Cultures pending -per primary.  2. Acute resp failure/Volume - intubated per PCCM. Volumeprob contributingandPer CCM has COPD component. Had 1.6L off Sat afternoon then re-dialyzed Sat evening for ^wob/ mucous secretions/ NOW On schedule /lower edw to 42.0 kg  3. ESRD -TTSHD. k 4.4  4. Hypertension-BP's up and down here. Cardizem started prev admission. 5. Anemia - Hgb 11.0 . No ESA needs. 6. Metabolic bone disease -Continue Ca acetate binder/Sensipar  Now  eating 7. Severe copd /tob abuse - tells me "I am cutting back my  cigarettes "  Ernest Haber, PA-C [place] [phone] 03/22/2020,9:57 AM  LOS: 5 days   Labs: Basic Metabolic Panel: Recent Labs  Lab 03/18/20 0425 03/20/20 1116 03/21/20 0155  NA 140 139 137  K 3.6 3.7 4.4  CL 100 97* 96*  CO2 $Re'25 26 24  'wFM$ GLUCOSE 66* 203* 147*  BUN 21 31* 44*  CREATININE 5.37* 5.05* 5.91*  CALCIUM 8.5* 9.4 8.5*  PHOS 4.9* 5.9*  --    Liver  Function Tests: Recent Labs  Lab 03/17/20 0808 03/20/20 1116  AST 27 31  ALT 17 18  ALKPHOS 66 61  BILITOT 0.9 0.6  PROT 6.4* 7.0  ALBUMIN 3.0* 2.9*   Recent Labs  Lab 03/17/20 0808  LIPASE 24   Recent Labs  Lab 03/17/20 0808  AMMONIA 22   CBC: Recent Labs  Lab 03/17/20 0808 03/17/20 1544 03/18/20 0627 03/20/20 1116 03/21/20 0155  WBC 4.5  --  4.6 13.0* 12.3*  NEUTROABS 2.9  --   --   --   --   HGB 13.7   < > 13.3 11.7* 11.0*  HCT 45.8   < > 45.4 38.5 35.5*  MCV 88.1  --  91.0 86.1 85.3  PLT 277  --  131* 259 274   < > = values in this interval not displayed.   Cardiac Enzymes: Recent Labs  Lab 03/17/20 0808  CKTOTAL 258*   CBG: Recent Labs  Lab 03/17/20 0635 03/18/20 2204  GLUCAP 103* 85    Studies/Results: No results found. Medications: . sodium chloride 10 mL/hr at 03/20/20 1600  . cefTRIAXone (ROCEPHIN)  IV 1 g (03/21/20 2000)   . budesonide (PULMICORT) nebulizer solution  0.5 mg Nebulization BID  . calcium acetate  667 mg Oral TID  WC  . Chlorhexidine Gluconate Cloth  6 each Topical Q0600  . Chlorhexidine Gluconate Cloth  6 each Topical Q0600  . cinacalcet  60 mg Oral Q breakfast  . diltiazem  30 mg Oral Q6H  . docusate  100 mg Per Tube BID  . heparin  5,000 Units Subcutaneous Q8H  . mouth rinse  15 mL Mouth Rinse BID  . methylPREDNISolone (SOLU-MEDROL) injection  40 mg Intravenous Daily  . metoprolol tartrate  5 mg Intravenous Q6H  . multivitamin with minerals  1 tablet Oral Daily  . polyethylene glycol  17 g Oral Daily

## 2020-04-03 ENCOUNTER — Other Ambulatory Visit: Payer: Self-pay | Admitting: Family Medicine

## 2020-04-03 DIAGNOSIS — Z1231 Encounter for screening mammogram for malignant neoplasm of breast: Secondary | ICD-10-CM

## 2020-06-27 ENCOUNTER — Other Ambulatory Visit: Payer: Self-pay

## 2020-06-27 ENCOUNTER — Encounter (HOSPITAL_COMMUNITY): Payer: Self-pay

## 2020-06-27 ENCOUNTER — Emergency Department (HOSPITAL_COMMUNITY): Payer: Medicare Other

## 2020-06-27 ENCOUNTER — Emergency Department (HOSPITAL_COMMUNITY)
Admission: EM | Admit: 2020-06-27 | Discharge: 2020-06-27 | Discharge status: Against Medical Advice | Payer: Medicare Other | Attending: Emergency Medicine | Admitting: Emergency Medicine

## 2020-06-27 DIAGNOSIS — I959 Hypotension, unspecified: Secondary | ICD-10-CM | POA: Insufficient documentation

## 2020-06-27 DIAGNOSIS — R0602 Shortness of breath: Secondary | ICD-10-CM | POA: Diagnosis present

## 2020-06-27 DIAGNOSIS — I120 Hypertensive chronic kidney disease with stage 5 chronic kidney disease or end stage renal disease: Secondary | ICD-10-CM | POA: Diagnosis not present

## 2020-06-27 DIAGNOSIS — J449 Chronic obstructive pulmonary disease, unspecified: Secondary | ICD-10-CM | POA: Insufficient documentation

## 2020-06-27 DIAGNOSIS — R202 Paresthesia of skin: Secondary | ICD-10-CM | POA: Diagnosis not present

## 2020-06-27 DIAGNOSIS — Z79899 Other long term (current) drug therapy: Secondary | ICD-10-CM | POA: Insufficient documentation

## 2020-06-27 DIAGNOSIS — N186 End stage renal disease: Secondary | ICD-10-CM | POA: Diagnosis not present

## 2020-06-27 DIAGNOSIS — F17210 Nicotine dependence, cigarettes, uncomplicated: Secondary | ICD-10-CM | POA: Diagnosis not present

## 2020-06-27 DIAGNOSIS — Z20822 Contact with and (suspected) exposure to covid-19: Secondary | ICD-10-CM | POA: Diagnosis not present

## 2020-06-27 LAB — CBC

## 2020-06-27 LAB — MAGNESIUM: Magnesium: 2.1 mg/dL (ref 1.7–2.4)

## 2020-06-27 LAB — CBC WITH DIFFERENTIAL/PLATELET
Abs Immature Granulocytes: 0 10*3/uL (ref 0.00–0.07)
Basophils Absolute: 0 10*3/uL (ref 0.0–0.1)
Basophils Relative: 0 %
Eosinophils Absolute: 0 10*3/uL (ref 0.0–0.5)
Eosinophils Relative: 0 %
HCT: 28.6 % — ABNORMAL LOW (ref 36.0–46.0)
Hemoglobin: 8.4 g/dL — ABNORMAL LOW (ref 12.0–15.0)
Lymphocytes Relative: 17 %
Lymphs Abs: 1.3 10*3/uL (ref 0.7–4.0)
MCH: 25.1 pg — ABNORMAL LOW (ref 26.0–34.0)
MCHC: 29.4 g/dL — ABNORMAL LOW (ref 30.0–36.0)
MCV: 85.6 fL (ref 80.0–100.0)
Monocytes Absolute: 0.4 10*3/uL (ref 0.1–1.0)
Monocytes Relative: 5 %
Neutro Abs: 5.8 10*3/uL (ref 1.7–7.7)
Neutrophils Relative %: 78 %
Platelets: 355 10*3/uL (ref 150–400)
RBC: 3.34 MIL/uL — ABNORMAL LOW (ref 3.87–5.11)
RDW: 22.4 % — ABNORMAL HIGH (ref 11.5–15.5)
WBC: 7.4 10*3/uL (ref 4.0–10.5)
nRBC: 0 /100 WBC
nRBC: 0.3 % — ABNORMAL HIGH (ref 0.0–0.2)

## 2020-06-27 LAB — HEPATIC FUNCTION PANEL
ALT: 19 U/L (ref 0–44)
AST: 65 U/L — ABNORMAL HIGH (ref 15–41)
Albumin: 2.3 g/dL — ABNORMAL LOW (ref 3.5–5.0)
Alkaline Phosphatase: 86 U/L (ref 38–126)
Bilirubin, Direct: 0.4 mg/dL — ABNORMAL HIGH (ref 0.0–0.2)
Indirect Bilirubin: 0.8 mg/dL (ref 0.3–0.9)
Total Bilirubin: 1.2 mg/dL (ref 0.3–1.2)
Total Protein: 6.4 g/dL — ABNORMAL LOW (ref 6.5–8.1)

## 2020-06-27 LAB — BASIC METABOLIC PANEL
Anion gap: 13 (ref 5–15)
BUN: 10 mg/dL (ref 8–23)
CO2: 31 mmol/L (ref 22–32)
Calcium: 8.2 mg/dL — ABNORMAL LOW (ref 8.9–10.3)
Chloride: 93 mmol/L — ABNORMAL LOW (ref 98–111)
Creatinine, Ser: 4.24 mg/dL — ABNORMAL HIGH (ref 0.44–1.00)
GFR calc Af Amer: 11 mL/min — ABNORMAL LOW (ref >60)
GFR calc non Af Amer: 10 mL/min — ABNORMAL LOW (ref >60)
Glucose, Bld: 79 mg/dL (ref 70–99)
Potassium: 4.3 mmol/L (ref 3.5–5.1)
Sodium: 137 mmol/L (ref 135–145)

## 2020-06-27 LAB — CBG MONITORING, ED
Glucose-Capillary: 48 mg/dL — ABNORMAL LOW (ref 70–99)
Glucose-Capillary: 98 mg/dL (ref 70–99)

## 2020-06-27 LAB — SARS CORONAVIRUS 2 BY RT PCR (HOSPITAL ORDER, PERFORMED IN ~~LOC~~ HOSPITAL LAB): SARS Coronavirus 2: NEGATIVE

## 2020-06-27 LAB — LACTIC ACID, PLASMA: Lactic Acid, Venous: 2.5 mmol/L (ref 0.5–1.9)

## 2020-06-27 LAB — POC OCCULT BLOOD, ED: Fecal Occult Bld: NEGATIVE

## 2020-06-27 IMAGING — DX DG CHEST 1V PORT
1 series · 1 of 1 positions shown · non-contrast
Comparison: [DATE].

CLINICAL DATA: Shortness of breath.

EXAM:
PORTABLE CHEST 1 VIEW

[chest]
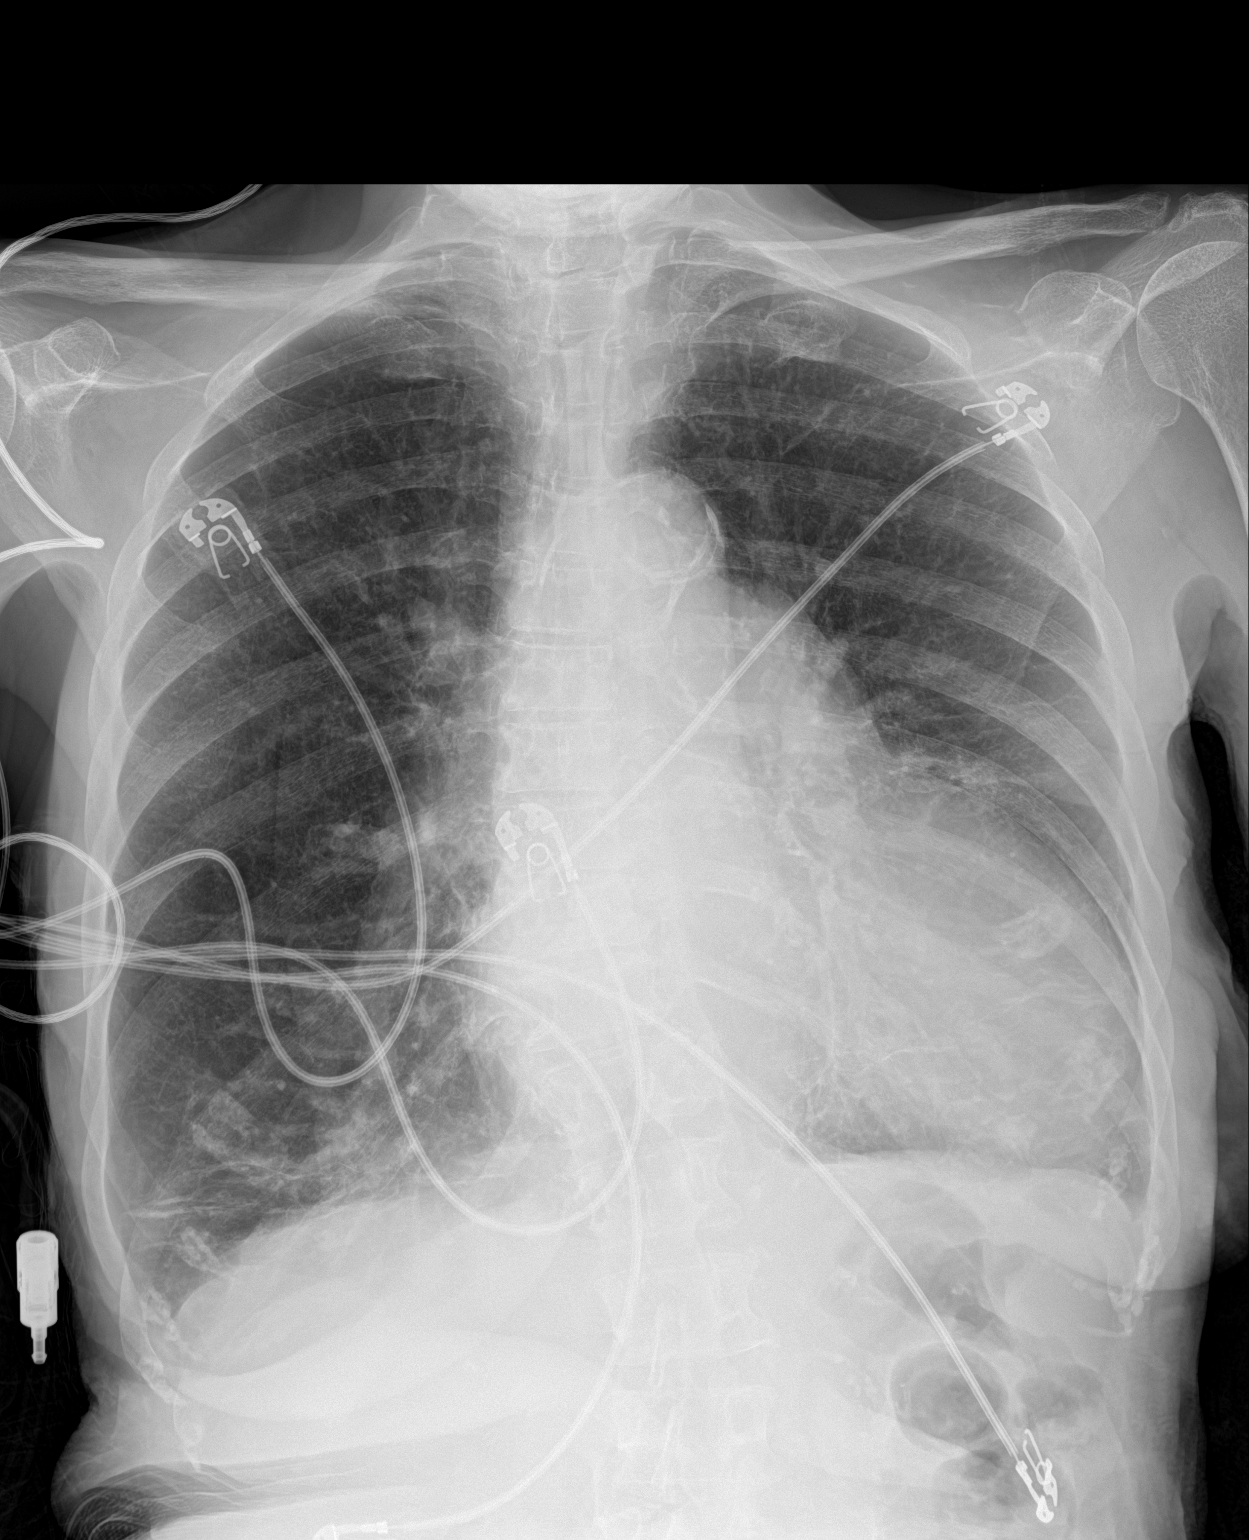

[1 of 1 positions shown; findings below may reference images not displayed]

FINDINGS: Cardiomegaly. No pulmonary venous congestion. No focal infiltrate.
Mild bibasilar atelectasis. No pleural effusion or pneumothorax.
Thoracic spine scoliosis and degenerative change.
IMPRESSION: 1. Cardiomegaly. No pulmonary venous congestion. 2. Mild bibasilar
atelectasis.

## 2020-06-27 MED ORDER — SODIUM CHLORIDE 0.9 % IV BOLUS
250.0000 mL | Freq: Once | INTRAVENOUS | Status: AC
  Administered 2020-06-27: 250 mL via INTRAVENOUS

## 2020-06-27 MED ORDER — SODIUM CHLORIDE 0.9 % IV SOLN: Freq: Once | INTRAVENOUS | Status: DC

## 2020-06-27 NOTE — Progress Notes (Signed)
Received page for patient admission.  Moments after receiving paged for admission received another stating patient wanted to leave AMA.  Rush to see patient and spoke with her about her concern due to her symptoms during HD as well as her hypotensive pressures.  After listening to her concerns about possible TIA, stroke, and increased risk for syncopal episode or falls patient still felt it was necessary to go home.  Patient reported feeling stable and appeared to have capacity to make her own medical decisions.  Also spoke with patient about possibly getting vaccine for COVID.  Patient was very adamant that she did not want the vaccine.  Despite hypotensive pressures patient was very upbeat and determined to go home.  Did not have opportunity to admit patient or do physical exam.  Patient signed AMA paperwork and was held back to the front of the ED as this is what she wished.

## 2020-06-27 NOTE — ED Triage Notes (Signed)
Patient arrived by [facility] from dialysis center. Patient received 1/2 of her treatment and than began to complain of bilateral finger, toe and tongue tingling. Reports now just finger tips feel numb, denies pain, dialysis graft still accessed. Patient found to have BP 70s on arrival

## 2020-06-27 NOTE — ED Provider Notes (Addendum)
[facility] EMERGENCY DEPARTMENT Provider Note   CSN: [id number] Arrival date & time: 06/27/20  1039     History Chief Complaint  Patient presents with  . Numbness  . Shortness of Breath    Bethany [name] is a 74 y.o. female.  Patient with history of end-stage renal disease on hemodialysis felt some tingling in her tongue, toes, fingers while at dialysis.  Low blood pressure while in triage.  Overall she feels improved.  She denies any severe shortness of breath or chest pain.  Tingling sensation in her tongue is no longer there.  She feels that the tips of her fingers and the tips of her toes feel funny.  But she denies any headache, weakness.  No history of stroke.  Probably got about half of her dialysis session today.  Does not know if she is at or below her dry weight.  Denies any new medications.  The history is provided by the patient.  Illness Severity:  Mild Onset quality:  Gradual Progression:  Improving Chronicity:  New Relieved by:  Nothing Worsened by:  Nothing Associated symptoms: no abdominal pain, no chest pain, no congestion, no cough, no diarrhea, no ear pain, no fatigue, no fever, no headaches, no rash, no rhinorrhea, no shortness of breath, no sore throat and no vomiting        Past Medical History:  Diagnosis Date  . Anemia   . Chronic kidney disease   . COPD (chronic obstructive pulmonary disease) ([place]) 10/21/2019  . ESRD on dialysis ([place]) 08/03/2018  . Fall 10/11/2019  . Femoral hernia 06/30/2013   LEFT    . Heart murmur    as a child  . Hernia, femoral    left  . Hyperlipidemia    no meds aken  . Hypertension   . Pneumonia   . Smoking 10/21/2019    Patient Active Problem List   Diagnosis Date Noted  . Acute respiratory failure with hypoxia ([place]) 03/21/2020  . Toxic encephalopathy 03/21/2020  . Acute encephalopathy 03/17/2020  . Acute metabolic encephalopathy 86/16/8372  . Acute delirium 03/10/2020  . Witnessed  seizure-like activity ([place]) 03/10/2020  . Hypoglycemia 03/10/2020  . Lactic acidosis 03/10/2020  . Elevated brain natriuretic peptide (BNP) level 03/10/2020  . Drug abuse ([place]) 03/10/2020  . Essential hypertension 03/10/2020  . COPD (chronic obstructive pulmonary disease) ([place]) 10/21/2019  . Restless leg syndrome 10/21/2019  . Smoking 10/21/2019  . Confusion   . Altered mental status, unspecified 10/20/2019  . Fall 10/11/2019  . ESRD (end stage renal disease) ([place]) 08/03/2018    Past Surgical History:  Procedure Laterality Date  . AV FISTULA PLACEMENT Left 08/14/2018   Procedure: INSERTION OF ARTERIOVENOUS GRAFT LEFT ARM;  Surgeon: Serafina Mitchell, MD;  Location: [facility] OR;  Service: Vascular;  Laterality: Left;  . COLONOSCOPY    . TONSILLECTOMY AND ADENOIDECTOMY     at age 14- unsure if adenoids were taken out     OB History   No obstetric history on file.     Family History  Problem Relation Age of Onset  . Heart attack Brother   . Breast cancer Other   . Skin cancer Other   . Colon cancer Neg Hx   . Pancreatic cancer Neg Hx   . Stomach cancer Neg Hx   . Esophageal cancer Neg Hx   . Rectal cancer Neg Hx     Social History   Tobacco Use  . Smoking status: Current Every Day  Smoker    Packs/day: 0.25    Types: Cigarettes  . Smokeless tobacco: Never Used  Vaping Use  . Vaping Use: Never used  Substance Use Topics  . Alcohol use: Yes    Alcohol/week: 7.0 standard drinks    Types: 7 Glasses of wine per week    Comment: occasional  . Drug use: No    Comment: marijuana     Home Medications Prior to Admission medications   Medication Sig Start Date End Date Taking? Authorizing Provider  albuterol (VENTOLIN HFA) 108 (90 Base) MCG/ACT inhaler Inhale 2 puffs into the lungs every 6 (six) hours as needed for wheezing or shortness of breath. 03/22/20   Florencia Reasons, MD  diltiazem (CARDIZEM CD) 180 MG 24 hr capsule Take 1 capsule (180 mg total) by mouth daily. 03/14/20 06/12/20   [place] Donnamarie Poag, DO  hydrALAZINE (APRESOLINE) 50 MG tablet Take 1 tablet (50 mg total) by mouth every 8 (eight) hours. 03/13/20 06/11/20  [place] Eric J, DO  SENSIPAR 30 MG tablet Take 30 mg by mouth 2 (two) times daily. 03/16/20   [provider]    Allergies    Patient has no known allergies.  Review of Systems   Review of Systems  Constitutional: Negative for chills, fatigue and fever.  HENT: Negative for congestion, ear pain, rhinorrhea and sore throat.   Eyes: Negative for pain and visual disturbance.  Respiratory: Negative for cough and shortness of breath.   Cardiovascular: Negative for chest pain and palpitations.  Gastrointestinal: Negative for abdominal pain, diarrhea and vomiting.  Genitourinary: Negative for dysuria and hematuria.  Musculoskeletal: Negative for arthralgias and back pain.  Skin: Negative for color change and rash.  Neurological: Positive for numbness. Negative for dizziness, tremors, seizures, syncope, facial asymmetry, speech difficulty, weakness, light-headedness and headaches.  All other systems reviewed and are negative.   Physical Exam Updated Vital Signs  ED Triage Vitals  Enc Vitals Group     BP 06/27/20 1044 (!) 76/53     Pulse Rate 06/27/20 1044 (!) 58     Resp --      Temp 06/27/20 1044 98 F (36.7 C)     Temp Source 06/27/20 1044 Oral     SpO2 06/27/20 1044 93 %     Weight --      Height --      Head Circumference --      Peak Flow --      Pain Score 06/27/20 1059 0     Pain Loc --      Pain Edu? --      Excl. in [place]? --     Physical Exam Vitals and nursing note reviewed.  Constitutional:      General: She is not in acute distress.    Appearance: She is well-developed. She is not ill-appearing.  HENT:     Head: Normocephalic and atraumatic.  Eyes:     Conjunctiva/sclera: Conjunctivae normal.     Pupils: Pupils are equal, round, and reactive to light.  Cardiovascular:     Rate and Rhythm: Normal rate and regular  rhythm.     Pulses: Normal pulses.     Heart sounds: Normal heart sounds. No murmur heard.   Pulmonary:     Effort: Pulmonary effort is normal. No respiratory distress.     Breath sounds: Normal breath sounds. No decreased breath sounds, wheezing, rhonchi or rales.  Abdominal:     Palpations: Abdomen is soft.     Tenderness:  There is no abdominal tenderness.  Musculoskeletal:     Cervical back: Normal range of motion and neck supple.     Right lower leg: No edema.     Left lower leg: No edema.  Skin:    General: Skin is warm and dry.     Capillary Refill: Capillary refill takes less than 2 seconds.  Neurological:     General: No focal deficit present.     Mental Status: She is alert and oriented to person, place, and time.     Cranial Nerves: No cranial nerve deficit.     Motor: No weakness.     Comments: 5+ out of 5 strength throughout, normal sensation, no drift, normal finger-nose-finger  Psychiatric:        Mood and Affect: Mood normal.     ED Results / Procedures / Treatments   Labs (all labs ordered are listed, but only abnormal results are displayed) Labs Reviewed  BASIC METABOLIC PANEL - Abnormal; Notable for the following components:      Result Value   Chloride 93 (*)    Creatinine, Ser 4.24 (*)    Calcium 8.2 (*)    GFR calc non Af Amer 10 (*)    GFR calc Af Amer 11 (*)    All other components within normal limits  LACTIC ACID, PLASMA - Abnormal; Notable for the following components:   Lactic Acid, Venous 2.5 (*)    All other components within normal limits  HEPATIC FUNCTION PANEL - Abnormal; Notable for the following components:   Total Protein 6.4 (*)    Albumin 2.3 (*)    AST 65 (*)    Bilirubin, Direct 0.4 (*)    All other components within normal limits  CBC WITH DIFFERENTIAL/PLATELET - Abnormal; Notable for the following components:   RBC 3.34 (*)    Hemoglobin 8.4 (*)    HCT 28.6 (*)    MCH 25.1 (*)    MCHC 29.4 (*)    RDW 22.4 (*)    nRBC 0.3  (*)    All other components within normal limits  CBG MONITORING, ED - Abnormal; Notable for the following components:   Glucose-Capillary 48 (*)    All other components within normal limits  SARS CORONAVIRUS 2 BY RT PCR (HOSPITAL ORDER, [facility])  CBC  MAGNESIUM  URINALYSIS, ROUTINE W REFLEX MICROSCOPIC  LACTIC ACID, PLASMA  CBG MONITORING, ED  POC OCCULT BLOOD, ED    EKG EKG Interpretation  Date/Time:  Tuesday June 27 2020 11:08:39 EDT Ventricular Rate:  77 PR Interval:  144 QRS Duration: 90 QT Interval:  426 QTC Calculation: 482 R Axis:   -75 Text Interpretation: Normal sinus rhythm Left anterior fascicular block Minimal voltage criteria for LVH, may be normal variant ( Cornell product ) Anterolateral infarct , age undetermined Abnormal ECG Confirmed by Lennice Sites [phone]) on 06/27/2020 11:18:31 AM Also confirmed by Lennice Sites [phone])  on 06/27/2020 12:38:24 PM   Radiology DG Chest Portable 1 View  Result Date: 06/27/2020 CLINICAL DATA:  Shortness of breath. EXAM: PORTABLE CHEST 1 VIEW COMPARISON:  03/18/2020. FINDINGS: Cardiomegaly. No pulmonary venous congestion. No focal infiltrate. Mild bibasilar atelectasis. No pleural effusion or pneumothorax. Thoracic spine scoliosis and degenerative change. IMPRESSION: 1. Cardiomegaly. No pulmonary venous congestion. 2. Mild bibasilar atelectasis. Electronically Signed   By: Marcello Moores  Register   On: 06/27/2020 12:12    Procedures Procedures (including critical care time)  Medications Ordered in ED Medications  0.9 %  sodium chloride infusion (has no administration in time range)  sodium chloride 0.9 % bolus 250 mL (0 mLs Intravenous Stopped 06/27/20 1532)    ED Course  I have reviewed the triage vital signs and the nursing notes.  Pertinent labs & imaging results that were available during my care of the patient were reviewed by me and considered in my medical decision making (see chart for  details).    MDM Rules/Calculators/A&P                          Bethany [name] is a 74 year old female with history of end-stage renal disease on hemodialysis who presents to the ED from dialysis after having some tingling in her toes and fingers and tongue.  Patient hypotensive upon arrival in the 96P systolic but overall she is asymptomatic.  She is alert and oriented in the room.  She denies any infectious symptoms.  No abdominal pain.  She states that she just feels some numbness or sensation changes in the tips of her fingers and toes but overall she actually has normal sensation to touch.  She has normal neurological exam.  Clear breath sounds.  Overall well-appearing.  Patient does have very thin arms and will try to get a pediatric cuff to check blood pressure.  No fever.  However will get lab work including a lactic acid.  Have a lower suspicion for infectious process and likely transient hypotension as recent dialysis.  She denies any other new medications are alcohol or drug use.  Will hold on fluids at this time and see if patient normalizes on her own as she recently just finished dialysis.  Overall lab work was significant for hemoglobin of 8.4.  This is down from her baseline however brown stool on exam.  She is not on blood thinners.  Hemoccult was negative.  Blood sugar was in the 70s however on repeat it was 48.  She was given food and drink and blood sugar improved to 98.  Lactic acid is mildly elevated at 2.5 and patient was given 250 cc fluid bolus.  Otherwise lab work is unremarkable.  Chest x-ray without signs of infection.  Patient overall has normal mentation and no complaints.  However blood pressure fluctuates between 80 and 90 systolic.  Do not know cause of this other than may be she is a little bit on the dry side.  Started maintenance fluids.  She may benefit from midodrine. Less concern for sepsis, cardiogenic shock.  Had patient admitted to family medicine however patient  does not want to stay.  Risks and benefits were discussed and patient decided to leave [facility]. She has capacity to make decision.   This chart was dictated using voice recognition software.  Despite best efforts to proofread,  errors can occur which can change the documentation meaning.    Final Clinical Impression(s) / ED Diagnoses Final diagnoses:  Hypotension, unspecified hypotension type  Paresthesias    Rx / DC Orders ED Discharge Orders    None       Lennice Sites, DO 06/27/20 [place], [place], DO 06/27/20 1603

## 2020-06-27 NOTE — Progress Notes (Signed)
Left Hemodysis needles removed x 2.   Pressure held until hemostasis obtain.  Gauze drsg. To site

## 2020-09-07 ENCOUNTER — Encounter (HOSPITAL_COMMUNITY): Payer: Self-pay

## 2020-09-07 ENCOUNTER — Other Ambulatory Visit: Payer: Self-pay

## 2020-09-07 ENCOUNTER — Observation Stay (HOSPITAL_COMMUNITY)
Admission: EM | Admit: 2020-09-07 | Discharge: 2020-09-08 | Disposition: A | Discharge status: Home / Self Care | Payer: Medicare Other | Attending: Family Medicine | Admitting: Family Medicine

## 2020-09-07 ENCOUNTER — Emergency Department (HOSPITAL_COMMUNITY): Payer: Medicare Other

## 2020-09-07 DIAGNOSIS — T829XXA Unspecified complication of cardiac and vascular prosthetic device, implant and graft, initial encounter: Secondary | ICD-10-CM

## 2020-09-07 DIAGNOSIS — Z20822 Contact with and (suspected) exposure to covid-19: Secondary | ICD-10-CM | POA: Diagnosis not present

## 2020-09-07 DIAGNOSIS — D631 Anemia in chronic kidney disease: Secondary | ICD-10-CM | POA: Diagnosis present

## 2020-09-07 DIAGNOSIS — J449 Chronic obstructive pulmonary disease, unspecified: Secondary | ICD-10-CM | POA: Diagnosis not present

## 2020-09-07 DIAGNOSIS — Z79899 Other long term (current) drug therapy: Secondary | ICD-10-CM | POA: Diagnosis not present

## 2020-09-07 DIAGNOSIS — I120 Hypertensive chronic kidney disease with stage 5 chronic kidney disease or end stage renal disease: Secondary | ICD-10-CM | POA: Insufficient documentation

## 2020-09-07 DIAGNOSIS — Z992 Dependence on renal dialysis: Secondary | ICD-10-CM | POA: Insufficient documentation

## 2020-09-07 DIAGNOSIS — I10 Essential (primary) hypertension: Secondary | ICD-10-CM | POA: Diagnosis present

## 2020-09-07 DIAGNOSIS — F17210 Nicotine dependence, cigarettes, uncomplicated: Secondary | ICD-10-CM | POA: Insufficient documentation

## 2020-09-07 DIAGNOSIS — T8249XA Other complication of vascular dialysis catheter, initial encounter: Secondary | ICD-10-CM | POA: Diagnosis not present

## 2020-09-07 DIAGNOSIS — N185 Chronic kidney disease, stage 5: Secondary | ICD-10-CM | POA: Diagnosis present

## 2020-09-07 DIAGNOSIS — J90 Pleural effusion, not elsewhere classified: Secondary | ICD-10-CM | POA: Diagnosis present

## 2020-09-07 DIAGNOSIS — N186 End stage renal disease: Secondary | ICD-10-CM | POA: Diagnosis not present

## 2020-09-07 DIAGNOSIS — R5383 Other fatigue: Secondary | ICD-10-CM | POA: Diagnosis present

## 2020-09-07 LAB — BASIC METABOLIC PANEL
Anion gap: 22 — ABNORMAL HIGH (ref 5–15)
BUN: 73 mg/dL — ABNORMAL HIGH (ref 8–23)
CO2: 17 mmol/L — ABNORMAL LOW (ref 22–32)
Calcium: 7.5 mg/dL — ABNORMAL LOW (ref 8.9–10.3)
Chloride: 104 mmol/L (ref 98–111)
Creatinine, Ser: 14.15 mg/dL — ABNORMAL HIGH (ref 0.44–1.00)
GFR, Estimated: 2 mL/min — ABNORMAL LOW (ref >60)
Glucose, Bld: 136 mg/dL — ABNORMAL HIGH (ref 70–99)
Potassium: 3.6 mmol/L (ref 3.5–5.1)
Sodium: 143 mmol/L (ref 135–145)

## 2020-09-07 LAB — RESPIRATORY PANEL BY RT PCR (FLU A&B, COVID)
Influenza A by PCR: NEGATIVE
Influenza B by PCR: NEGATIVE
SARS Coronavirus 2 by RT PCR: NEGATIVE

## 2020-09-07 LAB — CBC
HCT: 37.4 % (ref 36.0–46.0)
Hemoglobin: 11.5 g/dL — ABNORMAL LOW (ref 12.0–15.0)
MCH: 27 pg (ref 26.0–34.0)
MCHC: 30.7 g/dL (ref 30.0–36.0)
MCV: 87.8 fL (ref 80.0–100.0)
Platelets: 304 10*3/uL (ref 150–400)
RBC: 4.26 MIL/uL (ref 3.87–5.11)
RDW: 17.6 % — ABNORMAL HIGH (ref 11.5–15.5)
WBC: 6 10*3/uL (ref 4.0–10.5)
nRBC: 0 % (ref 0.0–0.2)

## 2020-09-07 IMAGING — DX DG CHEST 1V PORT
1 series · 1 of 1 positions shown · non-contrast
Comparison: [DATE], [DATE]

CLINICAL DATA: Missed dialysis

EXAM:
PORTABLE CHEST 1 VIEW

[chest]
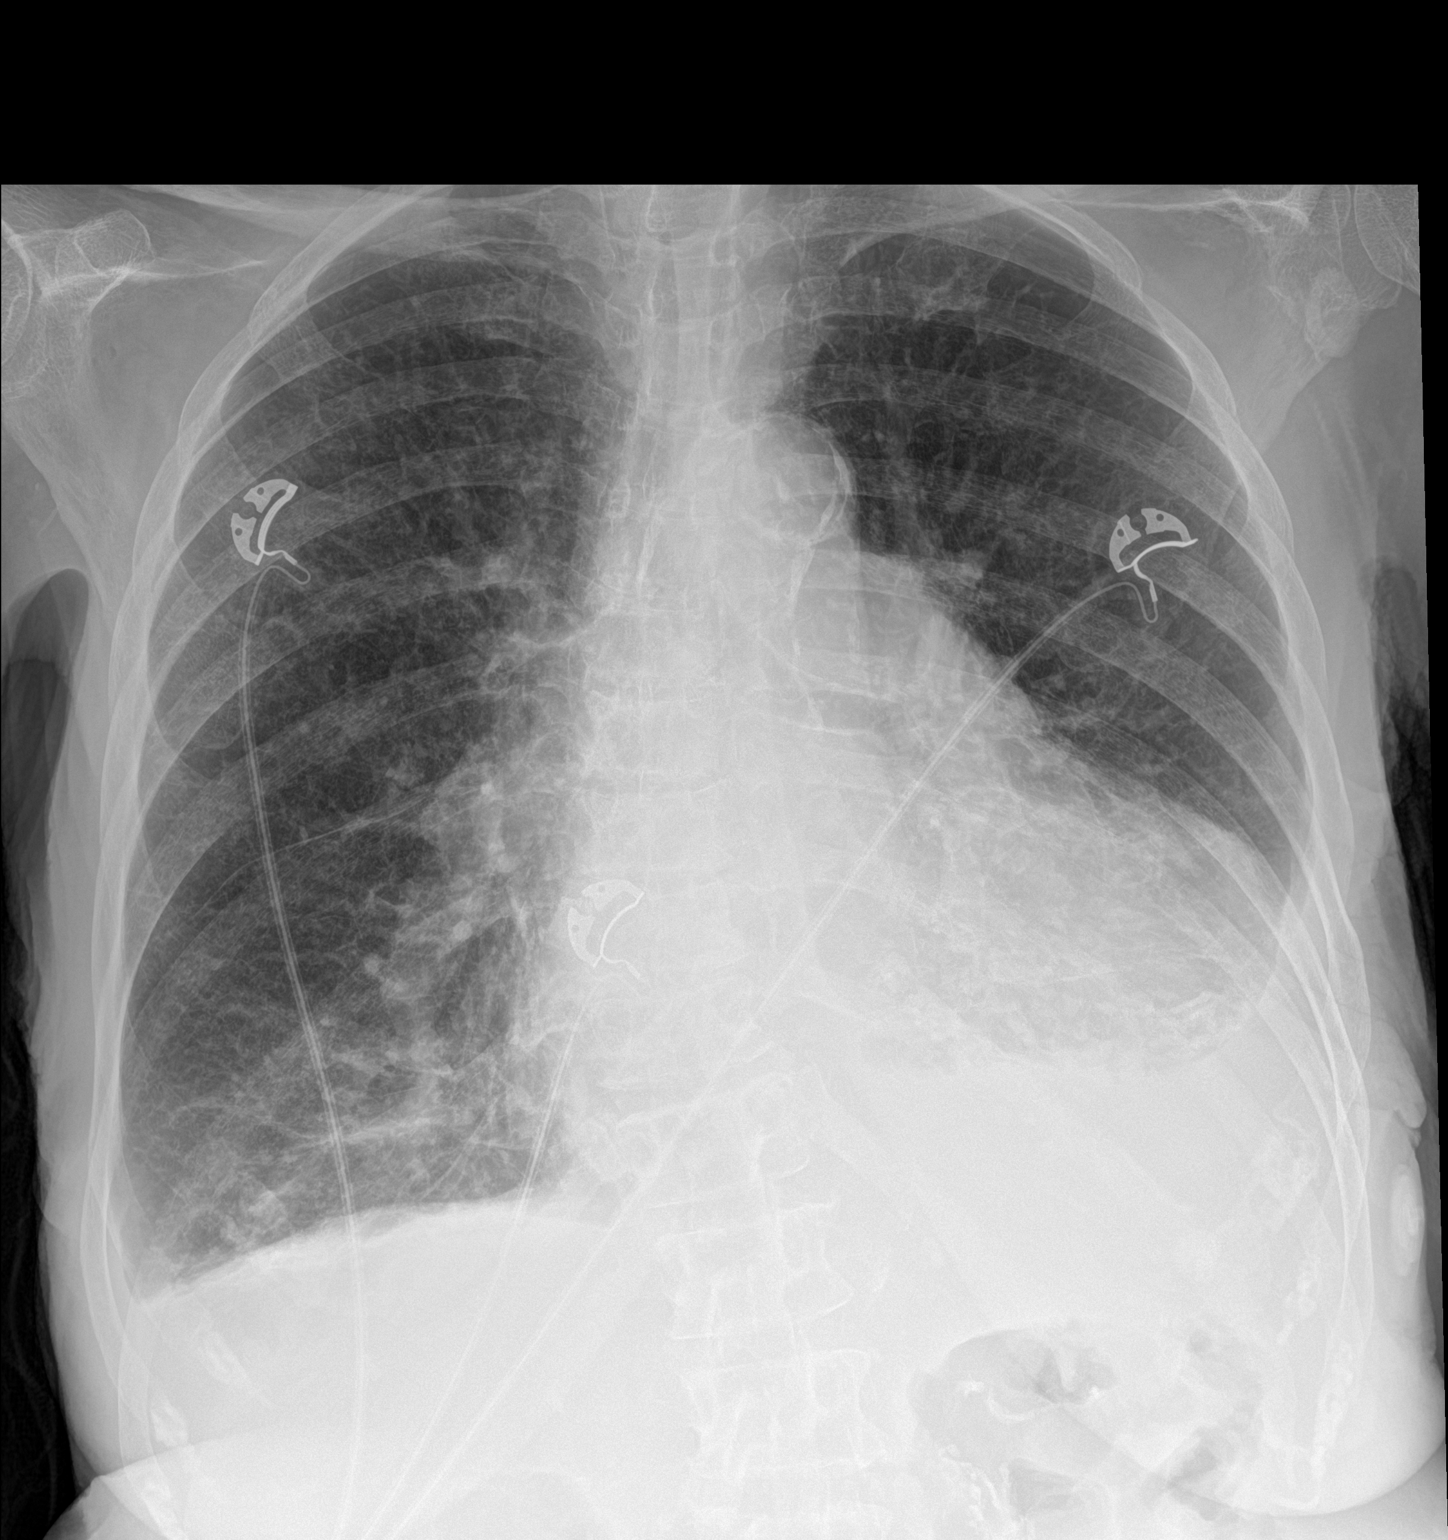

[1 of 1 positions shown; findings below may reference images not displayed]

FINDINGS: Cardiomegaly with vascular congestion. Small left greater than right
pleural effusions with basilar airspace disease on the left. Aortic
atherosclerosis. No pneumothorax. Subsegmental atelectasis or scar
at the right base.
IMPRESSION: Cardiomegaly with vascular congestion and small left greater than
right pleural effusions. Left basilar airspace disease, favor
atelectasis over pneumonia.

## 2020-09-07 MED ORDER — ACETAMINOPHEN 325 MG PO TABS: 650.0000 mg | ORAL_TABLET | Freq: Four times a day (QID) | ORAL | Status: DC | PRN

## 2020-09-07 MED ORDER — SODIUM CHLORIDE 0.9 % IV SOLN: 100.0000 mL | INTRAVENOUS | Status: DC | PRN

## 2020-09-07 MED ORDER — CHLORHEXIDINE GLUCONATE CLOTH 2 % EX PADS
6.0000 | MEDICATED_PAD | Freq: Every day | CUTANEOUS | Status: DC
  Administered 2020-09-08: 6 via TOPICAL

## 2020-09-07 MED ORDER — LIDOCAINE HCL (PF) 1 % IJ SOLN: 5.0000 mL | INTRAMUSCULAR | Status: DC | PRN

## 2020-09-07 MED ORDER — ONDANSETRON HCL 4 MG PO TABS: 4.0000 mg | ORAL_TABLET | Freq: Four times a day (QID) | ORAL | Status: DC | PRN

## 2020-09-07 MED ORDER — HEPARIN SODIUM (PORCINE) 1000 UNIT/ML DIALYSIS: 1000.0000 [IU] | INTRAMUSCULAR | Status: DC | PRN

## 2020-09-07 MED ORDER — HEPARIN SODIUM (PORCINE) 5000 UNIT/ML IJ SOLN
5000.0000 [IU] | Freq: Three times a day (TID) | INTRAMUSCULAR | Status: DC
  Filled 2020-09-07: qty 1

## 2020-09-07 MED ORDER — ACETAMINOPHEN 650 MG RE SUPP: 650.0000 mg | Freq: Four times a day (QID) | RECTAL | Status: DC | PRN

## 2020-09-07 MED ORDER — ALTEPLASE 2 MG IJ SOLR: 2.0000 mg | Freq: Once | INTRAMUSCULAR | Status: DC | PRN

## 2020-09-07 MED ORDER — PENTAFLUOROPROP-TETRAFLUOROETH EX AERO: 1.0000 "application " | INHALATION_SPRAY | CUTANEOUS | Status: DC | PRN

## 2020-09-07 MED ORDER — LIDOCAINE-PRILOCAINE 2.5-2.5 % EX CREA: 1.0000 "application " | TOPICAL_CREAM | CUTANEOUS | Status: DC | PRN

## 2020-09-07 MED ORDER — ONDANSETRON HCL 4 MG/2ML IJ SOLN: 4.0000 mg | Freq: Four times a day (QID) | INTRAMUSCULAR | Status: DC | PRN

## 2020-09-07 MED ORDER — ALBUTEROL SULFATE (2.5 MG/3ML) 0.083% IN NEBU: 3.0000 mL | INHALATION_SOLUTION | Freq: Four times a day (QID) | RESPIRATORY_TRACT | Status: DC | PRN

## 2020-09-07 NOTE — ED Triage Notes (Signed)
Pt arrives to ED w/ c/o missed 4 dialysis treatments. Pt denies symptoms.

## 2020-09-07 NOTE — ED Provider Notes (Signed)
[facility] EMERGENCY DEPARTMENT Provider Note   CSN: [id number] Arrival date & time: 09/07/20  1359     History Chief Complaint  Patient presents with  . missed dialysis    Bethany [name] is a 74 y.o. female.  The history is provided by the patient and medical records. No language interpreter was used.  Illness Quality:  Fatigue Severity:  Mild Onset quality:  Gradual Timing:  Constant Progression:  Unchanged Chronicity:  New Associated symptoms: fatigue   Associated symptoms: no abdominal pain, no chest pain, no congestion, no cough, no diarrhea, no fever, no headaches, no loss of consciousness, no myalgias, no nausea, no rash, no rhinorrhea, no shortness of breath, no vomiting and no wheezing        Past Medical History:  Diagnosis Date  . Anemia   . Chronic kidney disease   . COPD (chronic obstructive pulmonary disease) ([place]) 10/21/2019  . ESRD on dialysis ([place]) 08/03/2018  . Fall 10/11/2019  . Femoral hernia 06/30/2013   LEFT    . Heart murmur    as a child  . Hernia, femoral    left  . Hyperlipidemia    no meds aken  . Hypertension   . Pneumonia   . Smoking 10/21/2019    Patient Active Problem List   Diagnosis Date Noted  . Acute respiratory failure with hypoxia ([place]) 03/21/2020  . Toxic encephalopathy 03/21/2020  . Acute encephalopathy 03/17/2020  . Acute metabolic encephalopathy 25/63/8937  . Acute delirium 03/10/2020  . Witnessed seizure-like activity ([place]) 03/10/2020  . Hypoglycemia 03/10/2020  . Lactic acidosis 03/10/2020  . Elevated brain natriuretic peptide (BNP) level 03/10/2020  . Drug abuse ([place]) 03/10/2020  . Essential hypertension 03/10/2020  . COPD (chronic obstructive pulmonary disease) ([place]) 10/21/2019  . Restless leg syndrome 10/21/2019  . Smoking 10/21/2019  . Confusion   . Altered mental status, unspecified 10/20/2019  . Fall 10/11/2019  . ESRD (end stage renal disease) ([place]) 08/03/2018    Past Surgical  History:  Procedure Laterality Date  . AV FISTULA PLACEMENT Left 08/14/2018   Procedure: INSERTION OF ARTERIOVENOUS GRAFT LEFT ARM;  Surgeon: Serafina Mitchell, MD;  Location: [facility] OR;  Service: Vascular;  Laterality: Left;  . COLONOSCOPY    . TONSILLECTOMY AND ADENOIDECTOMY     at age 43- unsure if adenoids were taken out     OB History   No obstetric history on file.     Family History  Problem Relation Age of Onset  . Heart attack Brother   . Breast cancer Other   . Skin cancer Other   . Colon cancer Neg Hx   . Pancreatic cancer Neg Hx   . Stomach cancer Neg Hx   . Esophageal cancer Neg Hx   . Rectal cancer Neg Hx     Social History   Tobacco Use  . Smoking status: Current Every Day Smoker    Packs/day: 0.25    Types: Cigarettes  . Smokeless tobacco: Never Used  Vaping Use  . Vaping Use: Never used  Substance Use Topics  . Alcohol use: Yes    Alcohol/week: 7.0 standard drinks    Types: 7 Glasses of wine per week    Comment: occasional  . Drug use: No    Comment: marijuana     Home Medications Prior to Admission medications   Medication Sig Start Date End Date Taking? Authorizing Provider  albuterol (VENTOLIN HFA) 108 (90 Base) MCG/ACT inhaler Inhale 2 puffs into the  lungs every 6 (six) hours as needed for wheezing or shortness of breath. 03/22/20   Florencia Reasons, MD  diltiazem (CARDIZEM CD) 180 MG 24 hr capsule Take 1 capsule (180 mg total) by mouth daily. 03/14/20 06/12/20  [place] Donnamarie Poag, DO  hydrALAZINE (APRESOLINE) 50 MG tablet Take 1 tablet (50 mg total) by mouth every 8 (eight) hours. 03/13/20 06/11/20  [place] Eric J, DO  SENSIPAR 30 MG tablet Take 30 mg by mouth 2 (two) times daily. 03/16/20   [provider]    Allergies    Patient has no known allergies.  Review of Systems   Review of Systems  Constitutional: Positive for fatigue. Negative for chills and fever.  HENT: Negative for congestion and rhinorrhea.   Eyes: Negative for visual disturbance.    Respiratory: Negative for cough, chest tightness, shortness of breath and wheezing.   Cardiovascular: Negative for chest pain.  Gastrointestinal: Negative for abdominal pain, diarrhea, nausea and vomiting.  Musculoskeletal: Negative for back pain, myalgias, neck pain and neck stiffness.  Skin: Negative for rash and wound.  Neurological: Negative for dizziness, loss of consciousness, weakness, light-headedness and headaches.  Psychiatric/Behavioral: Negative for agitation and confusion.  All other systems reviewed and are negative.   Physical Exam Updated Vital Signs BP 106/75   Pulse 89   Temp 98.5 F (36.9 C) (Oral)   Resp 16   Ht $R'5\' 4"'wG$  (1.626 m)   Wt 49.9 kg   SpO2 98%   BMI 18.88 kg/m   Physical Exam Vitals and nursing note reviewed.  Constitutional:      General: She is not in acute distress.    Appearance: She is well-developed. She is not ill-appearing, toxic-appearing or diaphoretic.  HENT:     Head: Normocephalic and atraumatic.     Right Ear: External ear normal.     Left Ear: External ear normal.     Nose: Nose normal. No congestion or rhinorrhea.     Mouth/Throat:     Mouth: Mucous membranes are moist.     Pharynx: No oropharyngeal exudate or posterior oropharyngeal erythema.  Eyes:     Conjunctiva/sclera: Conjunctivae normal.     Pupils: Pupils are equal, round, and reactive to light.  Cardiovascular:     Rate and Rhythm: Normal rate.     Pulses: Normal pulses.     Heart sounds: No murmur heard.   Pulmonary:     Effort: No respiratory distress.     Breath sounds: No stridor. No wheezing, rhonchi or rales.  Chest:     Chest wall: No tenderness.  Abdominal:     General: Abdomen is flat. There is no distension.     Tenderness: There is no abdominal tenderness. There is no right CVA tenderness, left CVA tenderness, guarding or rebound.  Musculoskeletal:        General: No tenderness or signs of injury.     Cervical back: Normal range of motion and neck  supple. No tenderness.     Right lower leg: No edema.     Left lower leg: No edema.  Skin:    General: Skin is warm.     Capillary Refill: Capillary refill takes less than 2 seconds.     Findings: No erythema or rash.  Neurological:     General: No focal deficit present.     Mental Status: She is alert and oriented to person, place, and time.     Motor: No abnormal muscle tone.     Coordination:  Coordination normal.     Deep Tendon Reflexes: Reflexes are normal and symmetric.  Psychiatric:        Mood and Affect: Mood normal.     ED Results / Procedures / Treatments   Labs (all labs ordered are listed, but only abnormal results are displayed) Labs Reviewed  CBC - Abnormal; Notable for the following components:      Result Value   Hemoglobin 11.5 (*)    RDW 17.6 (*)    All other components within normal limits  BASIC METABOLIC PANEL - Abnormal; Notable for the following components:   CO2 17 (*)    Glucose, Bld 136 (*)    BUN 73 (*)    Creatinine, Ser 14.15 (*)    Calcium 7.5 (*)    GFR, Estimated 2 (*)    Anion gap 22 (*)    All other components within normal limits  RESPIRATORY PANEL BY RT PCR (FLU A&B, COVID)    EKG EKG Interpretation  Date/Time:  Thursday September 07 2020 14:14:24 EST Ventricular Rate:  85 PR Interval:  144 QRS Duration: 76 QT Interval:  382 QTC Calculation: 454 R Axis:   -120 Text Interpretation: Sinus rhythm with occasional Premature ventricular complexes Possible Left atrial enlargement Right superior axis deviation Anterior infarct , age undetermined Abnormal ECG No significant change since last tracing Confirmed by Gareth Morgan [phone]) on 09/07/2020 4:12:52 PM   Radiology DG Chest Portable 1 View  Result Date: 09/07/2020 CLINICAL DATA:  Missed dialysis EXAM: PORTABLE CHEST 1 VIEW COMPARISON:  06/27/2020, 03/18/2020 FINDINGS: Cardiomegaly with vascular congestion. Small left greater than right pleural effusions with basilar airspace  disease on the left. Aortic atherosclerosis. No pneumothorax. Subsegmental atelectasis or scar at the right base. IMPRESSION: Cardiomegaly with vascular congestion and small left greater than right pleural effusions. Left basilar airspace disease, favor atelectasis over pneumonia. Electronically Signed   By: Donavan Foil M.D.   On: 09/07/2020 15:48    Procedures Procedures (including critical care time)  Medications Ordered in ED Medications - No data to display  ED Course  I have reviewed the triage vital signs and the nursing notes.  Pertinent labs & imaging results that were available during my care of the patient were reviewed by me and considered in my medical decision making (see chart for details).    MDM Rules/Calculators/A&P                          Bethany [name] is a 74 y.o. female with a past medical history significant of ESRD on dialysis TTS, COPD, hypertension, and prior seizures who presents at the direction of her PCP for evaluation and likely dialysis given for missed sessions.  Patient reports that she has missed 4 sessions of dialysis due to feeling tired and sleeping past her treatments.  She says that otherwise, she has no symptoms.  She physically denies any fevers, chills, chest pain, shortness of breath, palpitations, lightheadedness, nausea, vomiting, syncope, constipation, diarrhea, and the small amount of urine she makes does not have any dysuria.  She denies any rashes.  She reports she chronically has very mild edema in her legs that is unchanged from baseline.  She says that she otherwise feels fine but they would not even talk to her until she came to the ED for evaluation of likely dialysis.  On exam, lungs are clear and chest is nontender.  Abdomen is nontender.  She has some mild edema in  the legs otherwise had good pulses.  She has normal strength and sensation in legs.  Good pulses in upper extremities.  Patient has a fissure on her left upper arm that is  well-appearing.  No rashes seen.  EKG showed a new PVC but otherwise no evidence of significant arrhythmia or STEMI.  T waves did not appear exquisitely sharp.  We will get screening labs to look for electrolytes and then touch base with nephrology.  Clinically patient is well-appearing and is not hypoxic on room air.   Work-up began to return and patient does have some fluid on x-ray in the base of the lungs.  Creatinine is more elevated than prior but potassium is normal.  Will call nephrology to discuss if they want dialysis.  Care transferred to Dr. Billy Fischer while waiting for nephrology recommendations.    Final Clinical Impression(s) / ED Diagnoses Final diagnoses:  Dialysis complication, initial encounter    Clinical Impression: 1. Dialysis complication, initial encounter     Disposition: Care transferred to Dr. Billy Fischer while waiting for nephrology recommendations.  This note was prepared with assistance of Systems analyst. Occasional wrong-word or sound-a-like substitutions may have occurred due to the inherent limitations of voice recognition software.      Marguerite Barba, Gwenyth Allegra, MD 09/07/20 [phone]

## 2020-09-07 NOTE — H&P (Signed)
History and Physical    Bethany [name] OIZ:[id number] DOB: May 16, 1946 DOA: 09/07/2020  PCP: Buzzy Han, MD  Patient coming from: Home  I have personally briefly reviewed patient's old medical records in [place]  Chief Complaint: Missed dialysis  HPI: Bethany [name] is a 74 y.o. female with medical history significant for ESRD on HD TTS, COPD, HTN, anemia of chronic kidney disease, tobacco and substance use disorder who presents to the ED for evaluation after multiple missed dialysis sessions.  Patient states she has missed 4 dialysis sessions.  She was advised to present to the ED by her PCP for evaluation.  Patient states she feels fine other than seeing some swelling around her ankles.  She denies any shortness of breath, cough, chest pain, nausea, vomiting, abdominal pain, diarrhea, or constipation.  She says she does still make urine and denies any recent dysuria. Patient states she has not taken any medications regularly.  ED Course:  Initial vitals showed BP 106/75, pulse 89, RR 16, temp 98.5 Fahrenheit, SPO2 98% on room air.  Labs show BUN 73, creatinine 14.15, sodium 143, potassium 3.6, bicarb 17, serum glucose 136, WBC 6.0, hemoglobin 11.5, platelets 304,000.  SARS-CoV-2 PCR is negative.  Influenza A/B PCR's are negative.  Portable chest x-ray showed cardiomegaly with vascular congestion and small left greater than right pleural effusions.  EDP discussed with on-call nephrology, Dr. Justin Mend, who recommended medical admission for dialysis.  The hospitalist service was consulted to admit for further evaluation and management.  Review of Systems: All systems reviewed and are negative except as documented in history of present illness above.   Past Medical History:  Diagnosis Date  . Anemia   . Chronic kidney disease   . COPD (chronic obstructive pulmonary disease) ([place]) 10/21/2019  . ESRD on dialysis ([place]) 08/03/2018  . Fall 10/11/2019  . Femoral hernia  06/30/2013   LEFT    . Heart murmur    as a child  . Hernia, femoral    left  . Hyperlipidemia    no meds aken  . Hypertension   . Pneumonia   . Smoking 10/21/2019    Past Surgical History:  Procedure Laterality Date  . AV FISTULA PLACEMENT Left 08/14/2018   Procedure: INSERTION OF ARTERIOVENOUS GRAFT LEFT ARM;  Surgeon: Serafina Mitchell, MD;  Location: [facility] OR;  Service: Vascular;  Laterality: Left;  . COLONOSCOPY    . TONSILLECTOMY AND ADENOIDECTOMY     at age 54- unsure if adenoids were taken out    Social History:  reports that she has been smoking cigarettes. She has been smoking about 0.25 packs per day. She has never used smokeless tobacco. She reports current alcohol use of about 7.0 standard drinks of alcohol per week. She reports that she does not use drugs.  No Known Allergies  Family History  Problem Relation Age of Onset  . Heart attack Brother   . Breast cancer Other   . Skin cancer Other   . Colon cancer Neg Hx   . Pancreatic cancer Neg Hx   . Stomach cancer Neg Hx   . Esophageal cancer Neg Hx   . Rectal cancer Neg Hx      Prior to Admission medications   Medication Sig Start Date End Date Taking? Authorizing Provider  B Complex-C-Folic Acid (RENAL VITAMIN PO) Take 1 tablet by mouth once a week.   Yes [provider]  BIOTIN PO Take 1 tablet by mouth daily.   Yes [provider]  albuterol (VENTOLIN HFA) 108 (90 Base) MCG/ACT inhaler Inhale 2 puffs into the lungs every 6 (six) hours as needed for wheezing or shortness of breath. Patient not taking: Reported on 09/07/2020 03/22/20   Florencia Reasons, MD  diltiazem (CARDIZEM CD) 180 MG 24 hr capsule Take 1 capsule (180 mg total) by mouth daily. 03/14/20 06/12/20  [place] Donnamarie Poag, DO  hydrALAZINE (APRESOLINE) 50 MG tablet Take 1 tablet (50 mg total) by mouth every 8 (eight) hours. 03/13/20 06/11/20  [place] Eric J, DO    Physical Exam: Vitals:   09/07/20 1402 09/07/20 1510 09/07/20 1512 09/07/20 1530    BP: 106/75 107/75  110/79  Pulse: 89  82 85  Resp: 16 (!) 26 (!) 23 (!) 23  Temp: 98.5 F (36.9 C)     TempSrc: Oral     SpO2: 98%  98% 99%  Weight: 49.9 kg     Height: $Remove'5\' 4"'TTVczCn$  (1.626 m)      Constitutional: Thin woman sitting up in bed, NAD, calm, comfortable Eyes: PERRL, lids and conjunctivae normal ENMT: Mucous membranes are moist. Posterior pharynx clear of any exudate or lesions.Normal dentition.  Neck: normal, supple, no masses. Respiratory: Faint inspiratory crackles left lung base otherwise clear to auscultation.  Normal respiratory effort. No accessory muscle use.  Cardiovascular: Regular rate and rhythm, no murmurs / rubs / gallops.  Trace nonpitting lower extremity edema around both ankles. 2+ pedal pulses.  LUE aVF with palpable thrill. Abdomen: no tenderness, no masses palpated. No hepatosplenomegaly. Bowel sounds positive.  Musculoskeletal: no clubbing / cyanosis. No joint deformity upper and lower extremities. Good ROM, no contractures. Normal muscle tone.  Skin: no rashes, lesions, ulcers. No induration Neurologic: CN 2-12 grossly intact. Sensation intact, Strength 5/5 in all 4.  Psychiatric: Normal judgment and insight. Alert and oriented x 3. Normal mood.   Labs on Admission: I have personally reviewed following labs and imaging studies  CBC: Recent Labs  Lab 09/07/20 1435  WBC 6.0  HGB 11.5*  HCT 37.4  MCV 87.8  PLT 962   Basic Metabolic Panel: Recent Labs  Lab 09/07/20 1435  NA 143  K 3.6  CL 104  CO2 17*  GLUCOSE 136*  BUN 73*  CREATININE 14.15*  CALCIUM 7.5*   GFR: Estimated Creatinine Clearance: 2.8 mL/min (A) (by C-G formula based on SCr of 14.15 mg/dL (H)). Liver Function Tests: No results for input(s): AST, ALT, ALKPHOS, BILITOT, PROT, ALBUMIN in the last 168 hours. No results for input(s): LIPASE, AMYLASE in the last 168 hours. No results for input(s): AMMONIA in the last 168 hours. Coagulation Profile: No results for input(s): INR,  PROTIME in the last 168 hours. Cardiac Enzymes: No results for input(s): CKTOTAL, CKMB, CKMBINDEX, TROPONINI in the last 168 hours. BNP (last 3 results) No results for input(s): PROBNP in the last 8760 hours. HbA1C: No results for input(s): HGBA1C in the last 72 hours. CBG: No results for input(s): GLUCAP in the last 168 hours. Lipid Profile: No results for input(s): CHOL, HDL, LDLCALC, TRIG, CHOLHDL, LDLDIRECT in the last 72 hours. Thyroid Function Tests: No results for input(s): TSH, T4TOTAL, FREET4, T3FREE, THYROIDAB in the last 72 hours. Anemia Panel: No results for input(s): VITAMINB12, FOLATE, FERRITIN, TIBC, IRON, RETICCTPCT in the last 72 hours. Urine analysis:    Component Value Date/Time   COLORURINE YELLOW 03/17/2020 [place] 03/17/2020 1437   LABSPEC 1.005 03/17/2020 1437   PHURINE 9.0 (H) 03/17/2020 1437   GLUCOSEU 50 (  A) 03/17/2020 1437   HGBUR LARGE (A) 03/17/2020 1437   BILIRUBINUR NEGATIVE 03/17/2020 1437   [place] 03/17/2020 1437   PROTEINUR >=300 (A) 03/17/2020 1437   NITRITE NEGATIVE 03/17/2020 [place] 03/17/2020 1437    Radiological Exams on Admission: DG Chest Portable 1 View  Result Date: 09/07/2020 CLINICAL DATA:  Missed dialysis EXAM: PORTABLE CHEST 1 VIEW COMPARISON:  06/27/2020, 03/18/2020 FINDINGS: Cardiomegaly with vascular congestion. Small left greater than right pleural effusions with basilar airspace disease on the left. Aortic atherosclerosis. No pneumothorax. Subsegmental atelectasis or scar at the right base. IMPRESSION: Cardiomegaly with vascular congestion and small left greater than right pleural effusions. Left basilar airspace disease, favor atelectasis over pneumonia. Electronically Signed   By: Donavan Foil M.D.   On: 09/07/2020 15:48    EKG: Personally reviewed. Sinus rhythm with PVC.  PVC new when compared to prior.  Assessment/Plan Principal Problem:   ESRD on hemodialysis  ([place]) Active Problems:   COPD (chronic obstructive pulmonary disease) ([facility])   Essential hypertension   Anemia of chronic kidney failure, stage 5 ([facility])   Pleural effusion, bilateral  Bethany [name] is a 74 y.o. female with medical history significant for ESRD on HD TTS, COPD, HTN, anemia of chronic kidney disease, tobacco and substance use disorder who is admitted for dialysis needs.  ESRD on HD TTS: Presenting after missing multiple dialysis sessions.  Currently stable without respiratory symptoms.  Nephrology consulted and plan for dialysis tonight per EDP. -Continue dialysis per nephrology  Bilateral pleural effusions: Small left greater than right pleural effusion seen on chest x-ray, new when compared to prior.  She is asymptomatic and not requiring supplemental oxygen.  Volume control with dialysis.  Hypertension: Blood pressure currently stable.  Patient states she is not currently taking any medications.  COPD: Stable without acute issue.  Albuterol as needed.  Anemia of chronic kidney disease: Chronic and stable without evidence of bleeding.  DVT prophylaxis: Subcutaneous heparin Code Status: Full code, confirmed with patient Family Communication: Discussed with patient, she does not want to notify family at this time. Disposition Plan: From home and likely discharge to home pending further dialysis needs Consults called: Nephrology Admission status:  Status is: Observation  The patient remains OBS appropriate and will d/c before 2 midnights.  Dispo: The patient is from: Home              Anticipated d/c is to: Home              Anticipated d/c date is: 1 day              Patient currently is medically stable to d/c.   Zada Finders MD [facility]  If 7PM-7AM, please contact night-coverage [email or web address]  09/07/2020, 6:57 PM

## 2020-09-07 NOTE — ED Notes (Signed)
Pt taken to hemodialysis

## 2020-09-08 DIAGNOSIS — N186 End stage renal disease: Secondary | ICD-10-CM | POA: Diagnosis not present

## 2020-09-08 DIAGNOSIS — Z992 Dependence on renal dialysis: Secondary | ICD-10-CM | POA: Diagnosis not present

## 2020-09-08 LAB — BASIC METABOLIC PANEL
Anion gap: 13 (ref 5–15)
BUN: 14 mg/dL (ref 8–23)
CO2: 27 mmol/L (ref 22–32)
Calcium: 8.1 mg/dL — ABNORMAL LOW (ref 8.9–10.3)
Chloride: 101 mmol/L (ref 98–111)
Creatinine, Ser: 4.13 mg/dL — ABNORMAL HIGH (ref 0.44–1.00)
GFR, Estimated: 11 mL/min — ABNORMAL LOW (ref >60)
Glucose, Bld: 89 mg/dL (ref 70–99)
Potassium: 2.3 mmol/L — CL (ref 3.5–5.1)
Sodium: 141 mmol/L (ref 135–145)

## 2020-09-08 LAB — POTASSIUM: Potassium: 3.4 mmol/L — ABNORMAL LOW (ref 3.5–5.1)

## 2020-09-08 NOTE — Discharge Summary (Signed)
Physician Discharge Summary  Bethany [name] TNZ:[id number] DOB: 1946/06/15 DOA: 09/07/2020  PCP: Bethany Ables, MD  Admit date: 09/07/2020 Discharge date: 09/08/2020  Admitted From: Home Disposition: Home  Recommendations for Outpatient Follow-up:  1. Follow up with PCP in 1 week 2. Please follow up on the following pending results: None  Home Health: None Equipment/Devices: None  Discharge Condition: Stable CODE STATUS: Full code Diet recommendation: Renal diet   Brief/Interim Summary:  Admission HPI written by Charlsie Quest, MD   Chief Complaint: Missed dialysis  HPI: Bethany [name] is a 74 y.o. female with medical history significant for ESRD on HD TTS, COPD, HTN, anemia of chronic kidney disease, tobacco and substance use disorder who presents to the ED for evaluation after multiple missed dialysis sessions.  Patient states she has missed 4 dialysis sessions.  She was advised to present to the ED by her PCP for evaluation.  Patient states she feels fine other than seeing some swelling around her ankles.  She denies any shortness of breath, cough, chest pain, nausea, vomiting, abdominal pain, diarrhea, or constipation.  She says she does still make urine and denies any recent dysuria. Patient states she has not taken any medications regularly.   Hospital course:  ESRD on HD Patient received hemodialysis while in the hospital. Resume outpatient regimen.  Bilateral pleural effusions Likely secondary to missed HD. No hypoxia. Management with HD.  Primary hypertension Controlled and patient is currently not taking her antihypertensives. These have been removed from her medication list.  COPD Stable.  Anemia of chronic disease Stable.  Discharge Diagnoses:  Principal Problem:   ESRD on hemodialysis [facility]) Active Problems:   COPD (chronic obstructive pulmonary disease) (HCC)   Essential hypertension   Anemia of chronic kidney failure, stage 5  (HCC)   Pleural effusion, bilateral    Discharge Instructions  Discharge Instructions    Call MD for:  difficulty breathing, headache or visual disturbances   Complete by: As directed    Increase activity slowly   Complete by: As directed      Allergies as of 09/08/2020   No Known Allergies     Medication List    STOP taking these medications   diltiazem 180 MG 24 hr capsule Commonly known as: CARDIZEM CD   hydrALAZINE 50 MG tablet Commonly known as: APRESOLINE     TAKE these medications   albuterol 108 (90 Base) MCG/ACT inhaler Commonly known as: VENTOLIN HFA Inhale 2 puffs into the lungs every 6 (six) hours as needed for wheezing or shortness of breath.   BIOTIN PO Take 1 tablet by mouth daily.   RENAL VITAMIN PO Take 1 tablet by mouth once a week.       Follow-up Information    Bethany Ables, MD. Schedule an appointment as soon as possible for a visit in 1 week(s).   Specialty: Family Medicine Contact information: [address] [place] [address] [phone]              No Known Allergies  Consultations:  Nephrology   Procedures/Studies: DG Chest Portable 1 View  Result Date: 09/07/2020 CLINICAL DATA:  Missed dialysis EXAM: PORTABLE CHEST 1 VIEW COMPARISON:  06/27/2020, 03/18/2020 FINDINGS: Cardiomegaly with vascular congestion. Small left greater than right pleural effusions with basilar airspace disease on the left. Aortic atherosclerosis. No pneumothorax. Subsegmental atelectasis or scar at the right base. IMPRESSION: Cardiomegaly with vascular congestion and small left greater than right pleural effusions. Left basilar airspace disease, favor atelectasis  over pneumonia. Electronically Signed   By: Donavan Foil M.D.   On: 09/07/2020 15:48       Subjective: No issues this morning.  Discharge Exam: Vitals:   09/08/20 0734 09/08/20 0757  BP: 103/70 111/69  Pulse: 93 92  Resp: 13 20  Temp: 98 F (36.7 C) 97.9  F (36.6 C)  SpO2: 93% 93%   Vitals:   09/08/20 0135 09/08/20 0445 09/08/20 0734 09/08/20 0757  BP: 112/79 106/86 103/70 111/69  Pulse: 90 98 93 92  Resp: (!) $RemoveB'22 20 13 20  'NNbtzPIe$ Temp: 98.3 F (36.8 C) 98 F (36.7 C) 98 F (36.7 C) 97.9 F (36.6 C)  TempSrc: Oral Oral Oral Oral  SpO2: 95% 92% 93% 93%  Weight: 49.4 kg     Height: $Remove'5\' 4"'ROjXrvI$  (1.626 m)       General: Pt is alert, awake, not in acute distress Cardiovascular: RRR, S1/S2 +, no rubs, no gallops Respiratory: CTA bilaterally, no wheezing, no rhonchi Abdominal: Soft, NT, ND, bowel sounds + Extremities: 2+ BLE edema, no cyanosis    The results of significant diagnostics from this hospitalization (including imaging, microbiology, ancillary and laboratory) are listed below for reference.     Microbiology: Recent Results (from the past 240 hour(s))  Respiratory Panel by RT PCR (Flu A&B, Covid) - Nasopharyngeal Swab     Status: None   Collection Time: 09/07/20  3:41 PM   Specimen: Nasopharyngeal Swab  Result Value Ref Range Status   SARS Coronavirus 2 by RT PCR NEGATIVE NEGATIVE Final    Comment: (NOTE) SARS-CoV-2 target nucleic acids are NOT DETECTED.  The SARS-CoV-2 RNA is generally detectable in upper respiratoy specimens during the acute phase of infection. The lowest concentration of SARS-CoV-2 viral copies this assay can detect is 131 copies/mL. A negative result does not preclude SARS-Cov-2 infection and should not be used as the sole basis for treatment or other patient management decisions. A negative result may occur with  improper specimen collection/handling, submission of specimen other than nasopharyngeal swab, presence of viral mutation(s) within the areas targeted by this assay, and inadequate number of viral copies (<131 copies/mL). A negative result must be combined with clinical observations, patient history, and epidemiological information. The expected result is Negative.  Fact Sheet for Patients:    PinkCheek.be  Fact Sheet for Healthcare Providers:  GravelBags.it  This test is no t yet approved or cleared by the [place] FDA and  has been authorized for detection and/or diagnosis of SARS-CoV-2 by FDA under an Emergency Use Authorization (EUA). This EUA will remain  in effect (meaning this test can be used) for the duration of the COVID-19 declaration under Section 564(b)(1) of the Act, 21 U.S.C. section 360bbb-3(b)(1), unless the authorization is terminated or revoked sooner.     Influenza A by PCR NEGATIVE NEGATIVE Final   Influenza B by PCR NEGATIVE NEGATIVE Final    Comment: (NOTE) The Xpert Xpress SARS-CoV-2/FLU/RSV assay is intended as an aid in  the diagnosis of influenza from Nasopharyngeal swab specimens and  should not be used as a sole basis for treatment. Nasal washings and  aspirates are unacceptable for Xpert Xpress SARS-CoV-2/FLU/RSV  testing.  Fact Sheet for Patients: PinkCheek.be  Fact Sheet for Healthcare Providers: GravelBags.it  This test is not yet approved or cleared by the [place] FDA and  has been authorized for detection and/or diagnosis of SARS-CoV-2 by  FDA under an Emergency Use Authorization (EUA). This EUA will remain  in effect (meaning  this test can be used) for the duration of the  Covid-19 declaration under Section 564(b)(1) of the Act, 21  U.S.C. section 360bbb-3(b)(1), unless the authorization is  terminated or revoked. Performed at [facility], [place] [address] [place], [place] [address]      Labs: BNP (last 3 results) Recent Labs    12/24/19 0858 03/09/20 1616 03/17/20 0808  BNP >4,500.0* >4,500.0* 6,222.9*   Basic Metabolic Panel: Recent Labs  Lab 09/07/20 1435 09/08/20 0143 09/08/20 0242  NA 143 141  --   K 3.6 2.3* 3.4*  CL 104 101  --   CO2 17* 27  --   GLUCOSE 136* 89  --   BUN  73* 14  --   CREATININE 14.15* 4.13*  --   CALCIUM 7.5* 8.1*  --    Liver Function Tests: No results for input(s): AST, ALT, ALKPHOS, BILITOT, PROT, ALBUMIN in the last 168 hours. No results for input(s): LIPASE, AMYLASE in the last 168 hours. No results for input(s): AMMONIA in the last 168 hours. CBC: Recent Labs  Lab 09/07/20 1435  WBC 6.0  HGB 11.5*  HCT 37.4  MCV 87.8  PLT 304   Cardiac Enzymes: No results for input(s): CKTOTAL, CKMB, CKMBINDEX, TROPONINI in the last 168 hours. BNP: Invalid input(s): POCBNP CBG: No results for input(s): GLUCAP in the last 168 hours. D-Dimer No results for input(s): DDIMER in the last 72 hours. Hgb A1c No results for input(s): HGBA1C in the last 72 hours. Lipid Profile No results for input(s): CHOL, HDL, LDLCALC, TRIG, CHOLHDL, LDLDIRECT in the last 72 hours. Thyroid function studies No results for input(s): TSH, T4TOTAL, T3FREE, THYROIDAB in the last 72 hours.  Invalid input(s): FREET3 Anemia work up No results for input(s): VITAMINB12, FOLATE, FERRITIN, TIBC, IRON, RETICCTPCT in the last 72 hours. Urinalysis    Component Value Date/Time   COLORURINE YELLOW 03/17/2020 1437   APPEARANCEUR CLEAR 03/17/2020 1437   LABSPEC 1.005 03/17/2020 1437   PHURINE 9.0 (H) 03/17/2020 1437   GLUCOSEU 50 (A) 03/17/2020 1437   HGBUR LARGE (A) 03/17/2020 1437   [place] 03/17/2020 [place] 03/17/2020 1437   PROTEINUR >=300 (A) 03/17/2020 1437   NITRITE NEGATIVE 03/17/2020 1437   LEUKOCYTESUR NEGATIVE 03/17/2020 1437   Sepsis Labs Invalid input(s): PROCALCITONIN,  WBC,  LACTICIDVEN Microbiology Recent Results (from the past 240 hour(s))  Respiratory Panel by RT PCR (Flu A&B, Covid) - Nasopharyngeal Swab     Status: None   Collection Time: 09/07/20  3:41 PM   Specimen: Nasopharyngeal Swab  Result Value Ref Range Status   SARS Coronavirus 2 by RT PCR NEGATIVE NEGATIVE Final    Comment: (NOTE) SARS-CoV-2 target  nucleic acids are NOT DETECTED.  The SARS-CoV-2 RNA is generally detectable in upper respiratoy specimens during the acute phase of infection. The lowest concentration of SARS-CoV-2 viral copies this assay can detect is 131 copies/mL. A negative result does not preclude SARS-Cov-2 infection and should not be used as the sole basis for treatment or other patient management decisions. A negative result may occur with  improper specimen collection/handling, submission of specimen other than nasopharyngeal swab, presence of viral mutation(s) within the areas targeted by this assay, and inadequate number of viral copies (<131 copies/mL). A negative result must be combined with clinical observations, patient history, and epidemiological information. The expected result is Negative.  Fact Sheet for Patients:  PinkCheek.be  Fact Sheet for Healthcare Providers:  GravelBags.it  This test is  no t yet approved or cleared by the [place] and  has been authorized for detection and/or diagnosis of SARS-CoV-2 by FDA under an Emergency Use Authorization (EUA). This EUA will remain  in effect (meaning this test can be used) for the duration of the COVID-19 declaration under Section 564(b)(1) of the Act, 21 U.S.C. section 360bbb-3(b)(1), unless the authorization is terminated or revoked sooner.     Influenza A by PCR NEGATIVE NEGATIVE Final   Influenza B by PCR NEGATIVE NEGATIVE Final    Comment: (NOTE) The Xpert Xpress SARS-CoV-2/FLU/RSV assay is intended as an aid in  the diagnosis of influenza from Nasopharyngeal swab specimens and  should not be used as a sole basis for treatment. Nasal washings and  aspirates are unacceptable for Xpert Xpress SARS-CoV-2/FLU/RSV  testing.  Fact Sheet for Patients: PinkCheek.be  Fact Sheet for Healthcare  Providers: GravelBags.it  This test is not yet approved or cleared by the [place] FDA and  has been authorized for detection and/or diagnosis of SARS-CoV-2 by  FDA under an Emergency Use Authorization (EUA). This EUA will remain  in effect (meaning this test can be used) for the duration of the  Covid-19 declaration under Section 564(b)(1) of the Act, 21  U.S.C. section 360bbb-3(b)(1), unless the authorization is  terminated or revoked. Performed at [facility], [place] [address], [place], [place] [address]     SIGNED:   Cordelia Poche, MD [facility] 09/08/2020, 12:39 PM

## 2020-09-08 NOTE — Progress Notes (Signed)
Discharge orders received.  Telemetry removed, CCMD notified.  This patient did not have an IV.  She drove herself to the hospital, attending MD [facility] approved pt to drive self home.  Discharge instructions reviewed.  Pt expressed understanding.

## 2020-09-08 NOTE — Progress Notes (Signed)
Renal Navigator notes patient's admission for missed HD. Patient missed two weeks of outpatient HD per documentation in [facility]' system. Navigator spoke with RN Dialysis Coordinator and asked if she could speak with patient about the importance of HD compliance/health education. Navigator available for support as needed.  Alphonzo Cruise,  Renal Navigator [phone]

## 2020-09-08 NOTE — Discharge Instructions (Signed)
[name],  You were in the hospital because of the need for hemodialysis. As we discussed, please try your hardest not to miss your treatments.

## 2020-09-08 NOTE — Progress Notes (Signed)
Round on patient today in correlation to several missed HD treatments. Patient reports that the chair time has become to early for her and some days she just doesn't "feel like getting the needles". Reinforced education on the effects of missed HD on life expectancy and potassium balance. Patient reports she will try to do better and attend her treatments and taking care of herself. Patient has limited support system, as she has no children, and was in the foster care as a child. Follow up with Renal Navigator to see about clinic time options as well.   Dorthey Sawyer, RN  Dialysis Nurse Coordinator

## 2020-09-08 NOTE — Progress Notes (Signed)
   09/08/20 0052  Hand-Off documentation  Handoff Given Given to shift RN/LPN  Report given to (Full Name) Leontine Locket, RN  Handoff Received Received from shift RN/LPN  Report received from (Full Name) Izaiyah Kleinman, RN  Vitals  Temp 98.5 F (36.9 C)  BP 105/74  MAP (mmHg) 81  BP Location Left Arm  BP Method Automatic  Patient Position (if appropriate) Lying  Pulse Rate 92  ECG Heart Rate 81  Resp (!) 22  Oxygen Therapy  SpO2 92 %  O2 Device Room Air  Pain Assessment  Pain Scale 0-10  Pain Score 0  Post-Hemodialysis Assessment  Rinseback Volume (mL) 250 mL  KECN 260 V  Dialyzer Clearance Lightly streaked  Duration of HD Treatment -hour(s) 4 hour(s)  Hemodialysis Intake (mL) 500 mL  UF Total -Machine (mL) 3516 mL  Net UF (mL) 3016 mL  Tolerated HD Treatment Yes  Post-Hemodialysis Comments tc achieved as expected, no complaintsand pt is stable.  AVG/AVF Arterial Site Held (minutes) 0 minutes  AVG/AVF Venous Site Held (minutes) 0 minutes  Education / Care Plan  Dialysis Education Provided Yes  Note  Observations stable

## 2020-09-08 NOTE — Progress Notes (Signed)
CRITICAL VALUE ALERT  Critical Value:  K=2.3  Date & Time Notied:  09/08/20 $RemoveB'@0225'RtlRfXzc$   Provider Notified: Dr. Ninetta Lights  Orders Received/Actions taken: Labs to be redrawn to verify K value prior to initiating replacement. Will continue to monitor.

## 2020-09-09 ENCOUNTER — Telehealth: Payer: Self-pay | Admitting: Nephrology

## 2020-09-09 NOTE — Telephone Encounter (Signed)
Transition of Care Contact from [place]  Date of Discharge: 09/08/20 Date of Contact: 09/09/20 Method of contact: phone - attempted  Attempted to contact patient to discuss transition of care from inpatient admission.  Patient did not answer the phone.  Message was left on patient's voicemail informing them we would attempt to call them again and if unable to reach will follow up at dialysis.  Jen Mow, PA-C [facility] Pager: [phone]

## 2020-09-10 ENCOUNTER — Telehealth: Payer: Self-pay | Admitting: Nephrology

## 2020-09-10 NOTE — Telephone Encounter (Signed)
Transition of Care Contact from [place]  Date of Discharge: 09/08/20  Date of Contact: 09/10/20 Method of contact: phone - attempted  Attempted to contact patient to discuss transition of care from inpatient admission.  Patient did not answer the phone.  Message was left on patient's voicemail informing them we would attempt to call them again and if unable to reach will follow up at dialysis.  Jen Mow, PA-C [employer]

## 2020-09-20 ENCOUNTER — Emergency Department (HOSPITAL_COMMUNITY)
Admission: EM | Admit: 2020-09-20 | Discharge: 2020-09-20 | Disposition: A | Discharge status: Home / Self Care | Payer: Medicare Other | Attending: Emergency Medicine | Admitting: Emergency Medicine

## 2020-09-20 ENCOUNTER — Other Ambulatory Visit: Payer: Self-pay

## 2020-09-20 ENCOUNTER — Emergency Department (HOSPITAL_COMMUNITY): Payer: Medicare Other

## 2020-09-20 DIAGNOSIS — Z79899 Other long term (current) drug therapy: Secondary | ICD-10-CM | POA: Diagnosis not present

## 2020-09-20 DIAGNOSIS — J449 Chronic obstructive pulmonary disease, unspecified: Secondary | ICD-10-CM | POA: Diagnosis not present

## 2020-09-20 DIAGNOSIS — F17210 Nicotine dependence, cigarettes, uncomplicated: Secondary | ICD-10-CM | POA: Diagnosis not present

## 2020-09-20 DIAGNOSIS — R404 Transient alteration of awareness: Secondary | ICD-10-CM | POA: Insufficient documentation

## 2020-09-20 DIAGNOSIS — Z992 Dependence on renal dialysis: Secondary | ICD-10-CM | POA: Diagnosis not present

## 2020-09-20 DIAGNOSIS — N186 End stage renal disease: Secondary | ICD-10-CM | POA: Insufficient documentation

## 2020-09-20 DIAGNOSIS — I120 Hypertensive chronic kidney disease with stage 5 chronic kidney disease or end stage renal disease: Secondary | ICD-10-CM | POA: Diagnosis not present

## 2020-09-20 LAB — ETHANOL: Alcohol, Ethyl (B): <10 mg/dL (ref <10)

## 2020-09-20 LAB — BASIC METABOLIC PANEL
Anion gap: 20 — ABNORMAL HIGH (ref 5–15)
BUN: 42 mg/dL — ABNORMAL HIGH (ref 8–23)
CO2: 23 mmol/L (ref 22–32)
Calcium: 7.3 mg/dL — ABNORMAL LOW (ref 8.9–10.3)
Chloride: 97 mmol/L — ABNORMAL LOW (ref 98–111)
Creatinine, Ser: 10.56 mg/dL — ABNORMAL HIGH (ref 0.44–1.00)
GFR, Estimated: 4 mL/min — ABNORMAL LOW (ref >60)
Glucose, Bld: 92 mg/dL (ref 70–99)
Potassium: 4.1 mmol/L (ref 3.5–5.1)
Sodium: 140 mmol/L (ref 135–145)

## 2020-09-20 LAB — CBC WITH DIFFERENTIAL/PLATELET
Abs Immature Granulocytes: 0.07 10*3/uL (ref 0.00–0.07)
Basophils Absolute: 0 10*3/uL (ref 0.0–0.1)
Basophils Relative: 0 %
Eosinophils Absolute: 0 10*3/uL (ref 0.0–0.5)
Eosinophils Relative: 0 %
HCT: 38.2 % (ref 36.0–46.0)
Hemoglobin: 12 g/dL (ref 12.0–15.0)
Immature Granulocytes: 1 %
Lymphocytes Relative: 10 %
Lymphs Abs: 0.5 10*3/uL — ABNORMAL LOW (ref 0.7–4.0)
MCH: 27.1 pg (ref 26.0–34.0)
MCHC: 31.4 g/dL (ref 30.0–36.0)
MCV: 86.4 fL (ref 80.0–100.0)
Monocytes Absolute: 0.5 10*3/uL (ref 0.1–1.0)
Monocytes Relative: 10 %
Neutro Abs: 4.1 10*3/uL (ref 1.7–7.7)
Neutrophils Relative %: 79 %
Platelets: 293 10*3/uL (ref 150–400)
RBC: 4.42 MIL/uL (ref 3.87–5.11)
RDW: 17.8 % — ABNORMAL HIGH (ref 11.5–15.5)
WBC: 5.2 10*3/uL (ref 4.0–10.5)
nRBC: 0 % (ref 0.0–0.2)

## 2020-09-20 LAB — CBG MONITORING, ED: Glucose-Capillary: 75 mg/dL (ref 70–99)

## 2020-09-20 IMAGING — DX DG CHEST 1V PORT
1 series · 1 of 1 positions shown · non-contrast
Comparison: [DATE].

CLINICAL DATA: Questionable syncope.

EXAM:
PORTABLE CHEST 1 VIEW

[chest ap]
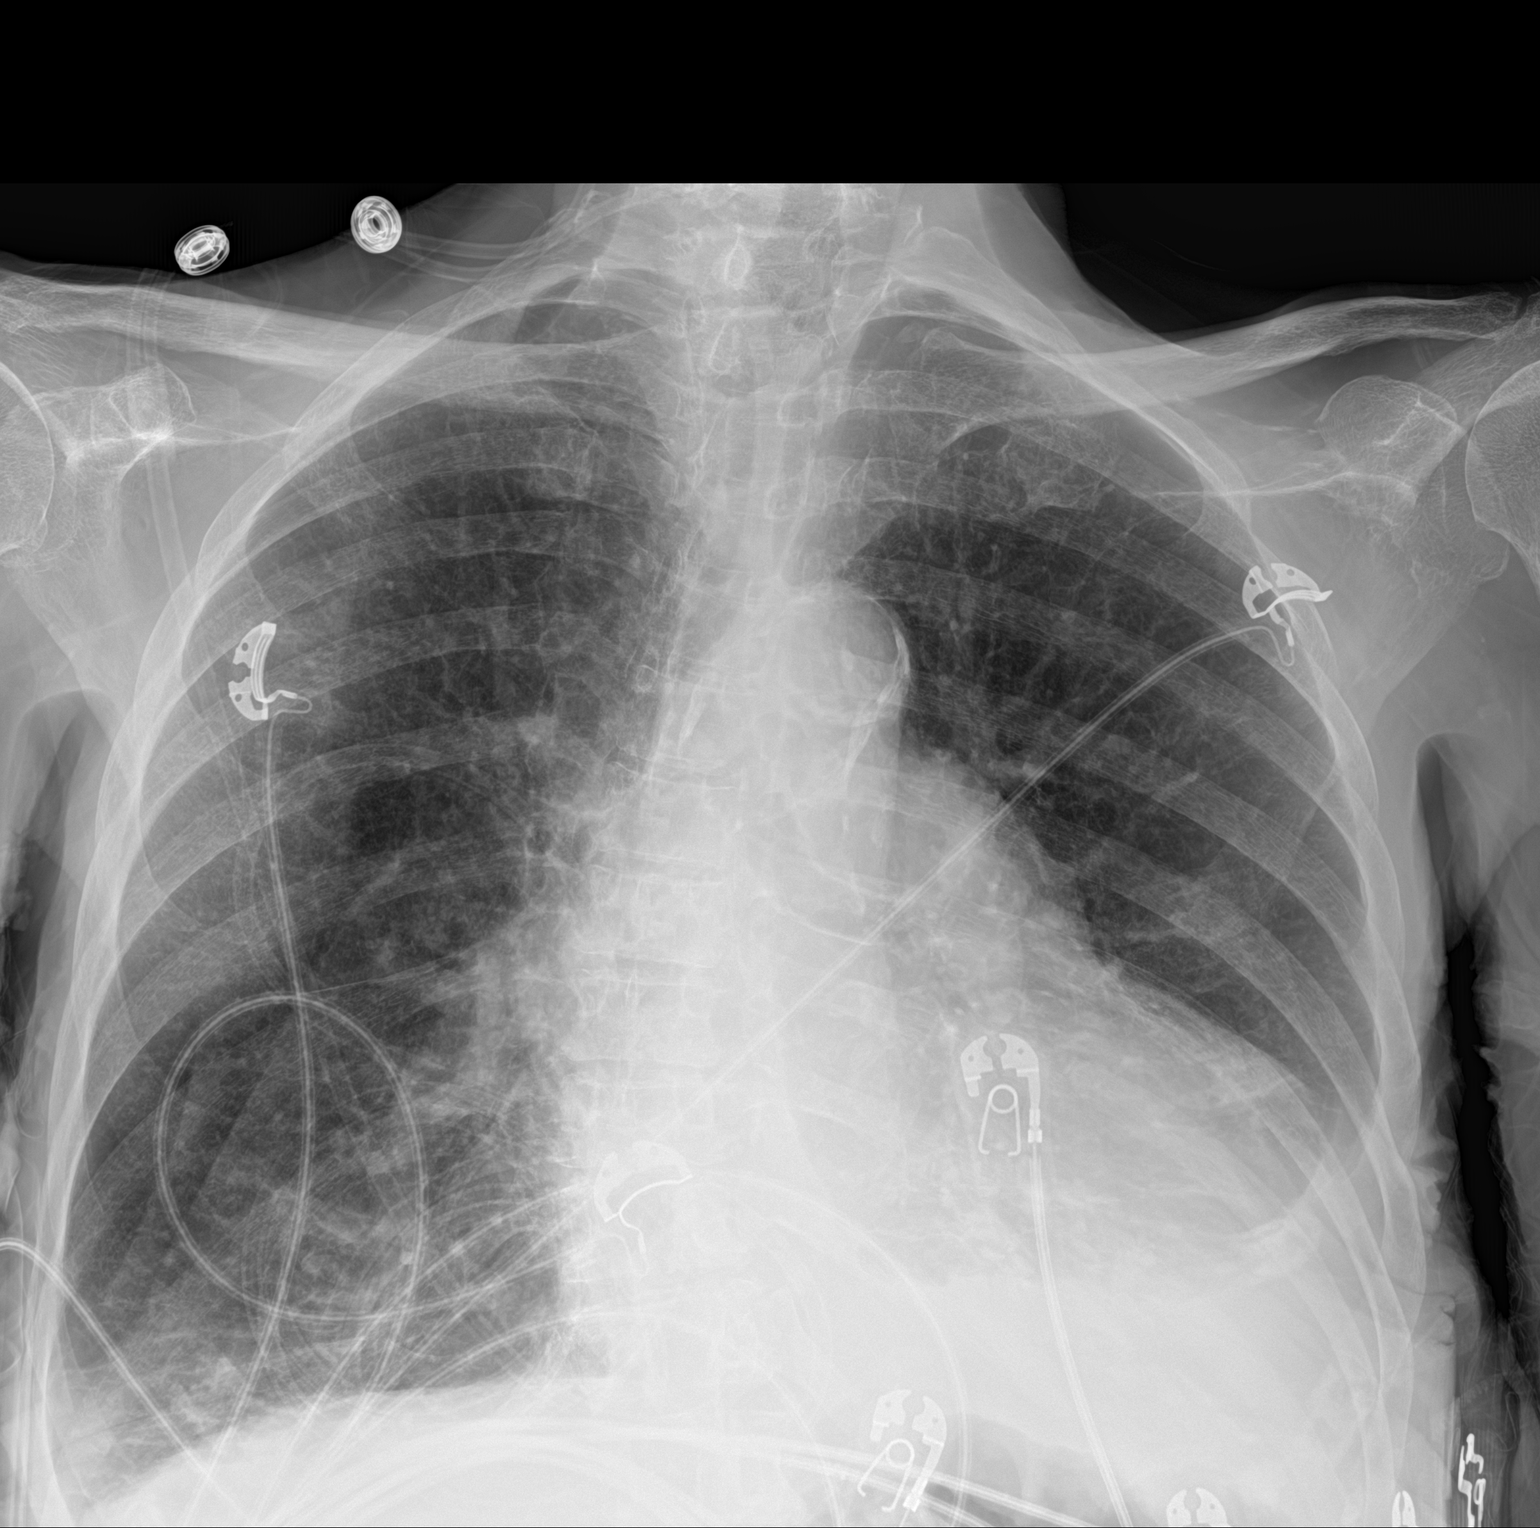

[1 of 1 positions shown; findings below may reference images not displayed]

FINDINGS: Similar enlarged cardiac silhouette. Aortic atherosclerosis. Similar
small left pleural effusion. No visible pneumothorax. Similar
diffuse interstitial prominence. Similar left basilar opacities.
Pulmonary vascular congestion. No evidence of acute osseous
abnormality
IMPRESSION: 1. Cardiomegaly, pulmonary vascular congestion and small left
pleural effusion. Mild interstitial prominence, which may represent
mild interstitial edema.
2. Similar overlying left basilar opacities, favored to reflect
atelectasis over pneumonia.
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
## 2020-09-20 NOTE — ED Notes (Signed)
Pt ambulated to bathroom without assistance 

## 2020-09-20 NOTE — Care Management (Signed)
ED CM arranged transportation home via [facility]  transportation [place] service.

## 2020-09-20 NOTE — ED Notes (Signed)
Pt ate and drank. IV team started 20g and blood sent to lab

## 2020-09-20 NOTE — Discharge Instructions (Addendum)
At this time there does not appear to be the presence of an emergent medical condition, however there is always the potential for conditions to change. Please read and follow the below instructions.  Please return to the Emergency Department immediately for any new or worsening symptoms. Please be sure to follow up with your Primary Care Provider within one week regarding your visit today; please call their office to schedule an appointment even if you are feeling better for a follow-up visit. Please be sure to go to your dialysis sessions as scheduled.  Go to the nearest Emergency Department immediately if: You have fever or chills You have a very bad headache. You pass out once or more than once. You have pain in your chest, belly, or back. You have a very fast or uneven heartbeat (palpitations). It hurts to breathe. You are bleeding from your mouth or your bottom (rectum). You have black or tarry poop (stool). You have jerky movements that you cannot control (seizure). You are confused. You have trouble walking. You are very weak. You have vision problems. You have any new/concerning or worsening of symptoms  Please read the additional information packets attached to your discharge summary.  Do not take your medicine if  develop an itchy rash, swelling in your mouth or lips, or difficulty breathing; call 911 and seek immediate emergency medical attention if this occurs.  You may review your lab tests and imaging results in their entirety on your MyChart account.  Please discuss all results of fully with your primary care provider and other specialist at your follow-up visit.  Note: Portions of this text may have been transcribed using voice recognition software. Every effort was made to ensure accuracy; however, inadvertent computerized transcription errors may still be present.
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
## 2020-10-14 ENCOUNTER — Emergency Department (HOSPITAL_COMMUNITY): Payer: Medicare Other

## 2020-10-14 ENCOUNTER — Observation Stay (HOSPITAL_COMMUNITY)
Admission: EM | Admit: 2020-10-14 | Discharge: 2020-10-15 | Disposition: A | Discharge status: Home / Self Care | Payer: Medicare Other | Attending: Internal Medicine | Admitting: Internal Medicine

## 2020-10-14 ENCOUNTER — Other Ambulatory Visit: Payer: Self-pay

## 2020-10-14 DIAGNOSIS — R4182 Altered mental status, unspecified: Principal | ICD-10-CM | POA: Insufficient documentation

## 2020-10-14 DIAGNOSIS — Z20822 Contact with and (suspected) exposure to covid-19: Secondary | ICD-10-CM | POA: Diagnosis not present

## 2020-10-14 DIAGNOSIS — R579 Shock, unspecified: Secondary | ICD-10-CM | POA: Diagnosis present

## 2020-10-14 DIAGNOSIS — E872 Acidosis, unspecified: Secondary | ICD-10-CM | POA: Diagnosis present

## 2020-10-14 DIAGNOSIS — J449 Chronic obstructive pulmonary disease, unspecified: Secondary | ICD-10-CM | POA: Diagnosis present

## 2020-10-14 DIAGNOSIS — Z515 Encounter for palliative care: Secondary | ICD-10-CM

## 2020-10-14 DIAGNOSIS — F17210 Nicotine dependence, cigarettes, uncomplicated: Secondary | ICD-10-CM | POA: Diagnosis not present

## 2020-10-14 DIAGNOSIS — Z992 Dependence on renal dialysis: Secondary | ICD-10-CM | POA: Insufficient documentation

## 2020-10-14 DIAGNOSIS — Z66 Do not resuscitate: Secondary | ICD-10-CM | POA: Insufficient documentation

## 2020-10-14 DIAGNOSIS — G9340 Encephalopathy, unspecified: Secondary | ICD-10-CM | POA: Diagnosis present

## 2020-10-14 DIAGNOSIS — I120 Hypertensive chronic kidney disease with stage 5 chronic kidney disease or end stage renal disease: Secondary | ICD-10-CM | POA: Insufficient documentation

## 2020-10-14 DIAGNOSIS — N186 End stage renal disease: Secondary | ICD-10-CM

## 2020-10-14 DIAGNOSIS — I10 Essential (primary) hypertension: Secondary | ICD-10-CM | POA: Diagnosis present

## 2020-10-14 LAB — CBC WITH DIFFERENTIAL/PLATELET
Abs Immature Granulocytes: 0.15 10*3/uL — ABNORMAL HIGH (ref 0.00–0.07)
Basophils Absolute: 0 10*3/uL (ref 0.0–0.1)
Basophils Relative: 0 %
Eosinophils Absolute: 0 10*3/uL (ref 0.0–0.5)
Eosinophils Relative: 0 %
HCT: 32.2 % — ABNORMAL LOW (ref 36.0–46.0)
Hemoglobin: 9.3 g/dL — ABNORMAL LOW (ref 12.0–15.0)
Immature Granulocytes: 2 %
Lymphocytes Relative: 10 %
Lymphs Abs: 0.7 10*3/uL (ref 0.7–4.0)
MCH: 26.3 pg (ref 26.0–34.0)
MCHC: 28.9 g/dL — ABNORMAL LOW (ref 30.0–36.0)
MCV: 91 fL (ref 80.0–100.0)
Monocytes Absolute: 0.3 10*3/uL (ref 0.1–1.0)
Monocytes Relative: 4 %
Neutro Abs: 5.6 10*3/uL (ref 1.7–7.7)
Neutrophils Relative %: 84 %
Platelets: 150 10*3/uL (ref 150–400)
RBC: 3.54 MIL/uL — ABNORMAL LOW (ref 3.87–5.11)
RDW: 18.7 % — ABNORMAL HIGH (ref 11.5–15.5)
WBC: 6.7 10*3/uL (ref 4.0–10.5)
nRBC: 0.4 % — ABNORMAL HIGH (ref 0.0–0.2)

## 2020-10-14 LAB — I-STAT CHEM 8, ED
BUN: 112 mg/dL — ABNORMAL HIGH (ref 8–23)
Calcium, Ion: 0.89 mmol/L — CL (ref 1.15–1.40)
Chloride: 108 mmol/L (ref 98–111)
Creatinine, Ser: 16.9 mg/dL — ABNORMAL HIGH (ref 0.44–1.00)
Glucose, Bld: 112 mg/dL — ABNORMAL HIGH (ref 70–99)
HCT: 33 % — ABNORMAL LOW (ref 36.0–46.0)
Hemoglobin: 11.2 g/dL — ABNORMAL LOW (ref 12.0–15.0)
Potassium: 4.3 mmol/L (ref 3.5–5.1)
Sodium: 138 mmol/L (ref 135–145)
TCO2: 9 mmol/L — ABNORMAL LOW (ref 22–32)

## 2020-10-14 LAB — LACTIC ACID, PLASMA: Lactic Acid, Venous: >11 mmol/L (ref 0.5–1.9)

## 2020-10-14 LAB — COMPREHENSIVE METABOLIC PANEL
ALT: 20 U/L (ref 0–44)
AST: 60 U/L — ABNORMAL HIGH (ref 15–41)
Albumin: 1.5 g/dL — ABNORMAL LOW (ref 3.5–5.0)
Alkaline Phosphatase: 103 U/L (ref 38–126)
BUN: 107 mg/dL — ABNORMAL HIGH (ref 8–23)
CO2: <7 mmol/L — ABNORMAL LOW (ref 22–32)
Calcium: 7.7 mg/dL — ABNORMAL LOW (ref 8.9–10.3)
Chloride: 102 mmol/L (ref 98–111)
Creatinine, Ser: 17.59 mg/dL — ABNORMAL HIGH (ref 0.44–1.00)
GFR, Estimated: 2 mL/min — ABNORMAL LOW (ref >60)
Glucose, Bld: 130 mg/dL — ABNORMAL HIGH (ref 70–99)
Potassium: 4.4 mmol/L (ref 3.5–5.1)
Sodium: 141 mmol/L (ref 135–145)
Total Bilirubin: 1.1 mg/dL (ref 0.3–1.2)
Total Protein: 4.7 g/dL — ABNORMAL LOW (ref 6.5–8.1)

## 2020-10-14 LAB — MAGNESIUM: Magnesium: 2.6 mg/dL — ABNORMAL HIGH (ref 1.7–2.4)

## 2020-10-14 LAB — RESP PANEL BY RT-PCR (FLU A&B, COVID) ARPGX2
Influenza A by PCR: NEGATIVE
Influenza B by PCR: NEGATIVE
SARS Coronavirus 2 by RT PCR: NEGATIVE

## 2020-10-14 LAB — AMMONIA: Ammonia: 121 umol/L — ABNORMAL HIGH (ref 9–35)

## 2020-10-14 LAB — PROTIME-INR
INR: 1.8 — ABNORMAL HIGH (ref 0.8–1.2)
Prothrombin Time: 20.3 seconds — ABNORMAL HIGH (ref 11.4–15.2)

## 2020-10-14 LAB — CK: Total CK: 1188 U/L — ABNORMAL HIGH (ref 38–234)

## 2020-10-14 LAB — TROPONIN I (HIGH SENSITIVITY): Troponin I (High Sensitivity): 496 ng/L (ref <18)

## 2020-10-14 IMAGING — DX DG CHEST 1V PORT
1 series · 1 of 1 positions shown · non-contrast
Comparison: [DATE]

CLINICAL DATA: End-stage renal disease, missed dialysis appointment

EXAM:
PORTABLE CHEST 1 VIEW

[chest]
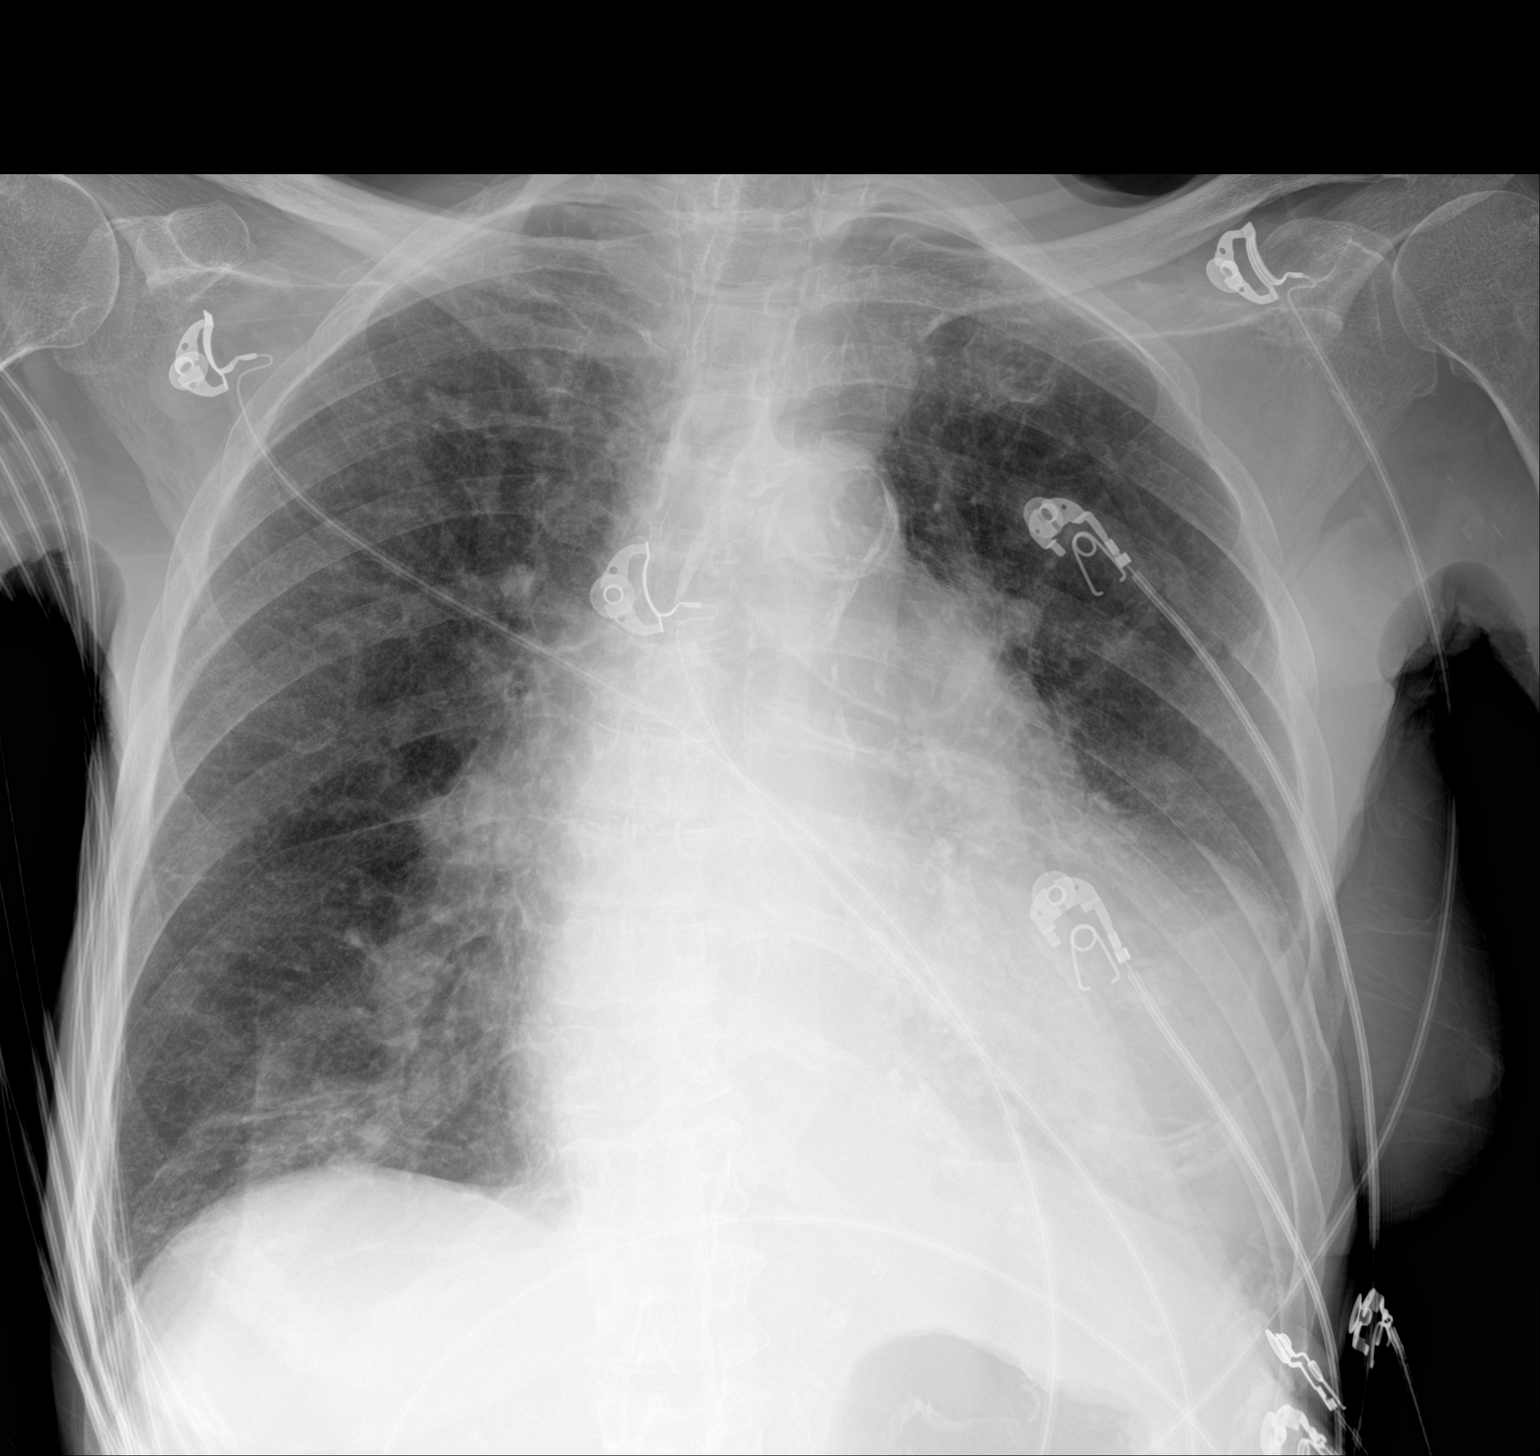

[1 of 1 positions shown; findings below may reference images not displayed]

FINDINGS: Single frontal view of the chest demonstrates persistent enlargement
the cardiac silhouette. Chronic central vascular congestion.
Increased density at the left lung base consistent with
consolidation and/or effusion. This is decreased since prior study.
No pneumothorax. No acute bony abnormalities.
IMPRESSION: 1. Decreased left basilar consolidation and/or effusion.
2. Chronic central vascular congestion.

## 2020-10-14 IMAGING — CT CT HEAD W/O CM
4 series · 15 of 47 positions shown, 17 images · non-contrast
Comparison: [DATE]

CLINICAL DATA: Delirium

EXAM:
CT HEAD WITHOUT CONTRAST
TECHNIQUE: Contiguous axial images were obtained from the base of the skull
through the vertex without intravenous contrast.

[Series 3: head without · axial · non-contrast · 0.42mm/px · z∈[-126,-6]mm · 7 of 33 slices shown, 9 images]
[im 5/33  brain]
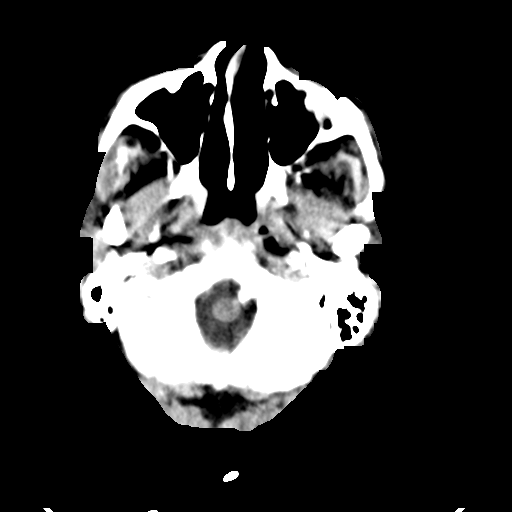
[im 5/33  bone]
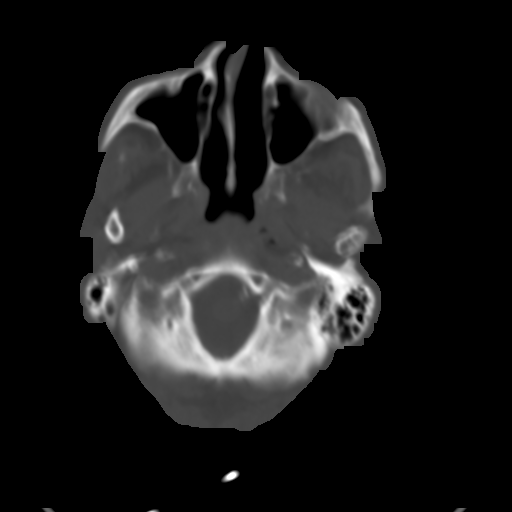
[im 9/33  brain]
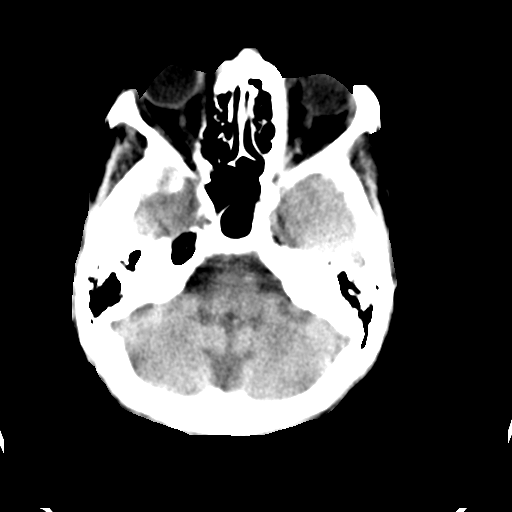
[im 13/33  brain]
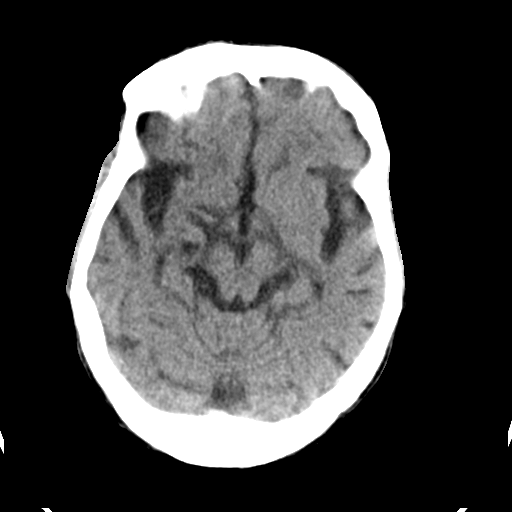
[im 17/33  brain]
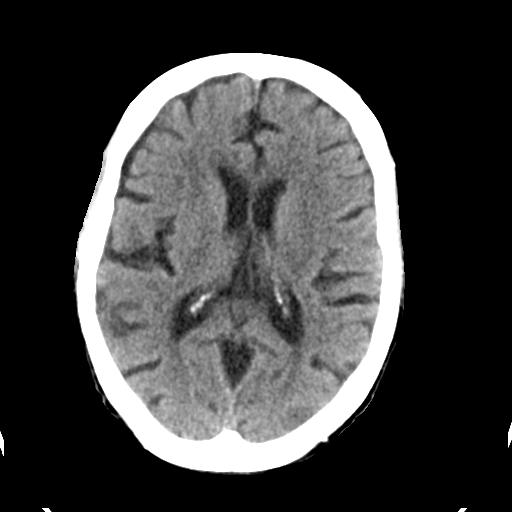
[im 21/33  brain]
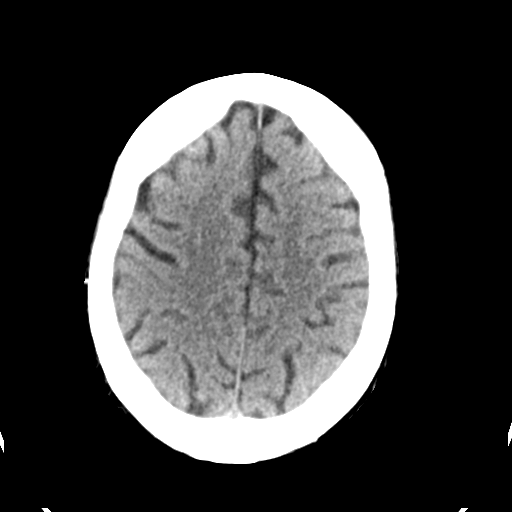
[im 21/33  bone]
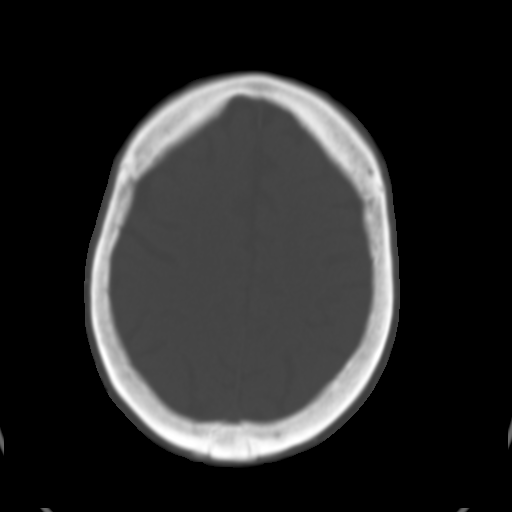
[im 25/33  brain]
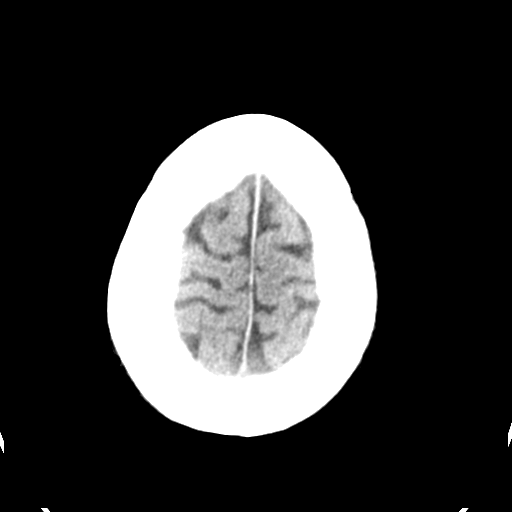
[im 29/33  brain]
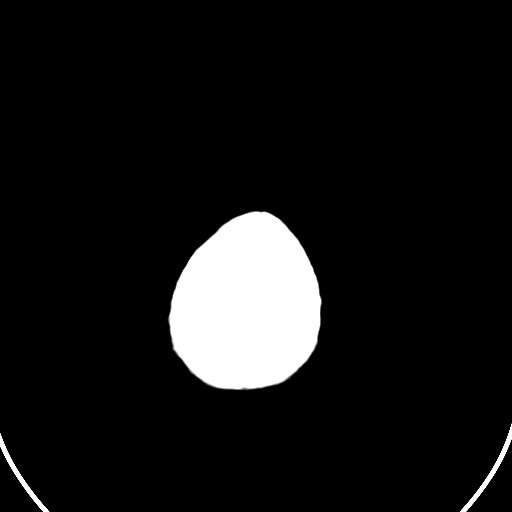

[Series 4: head bone · axial · 0.42mm/px · z∈[-130,-114]mm · 2 of 81 slices shown]
[im 9/81  bone]
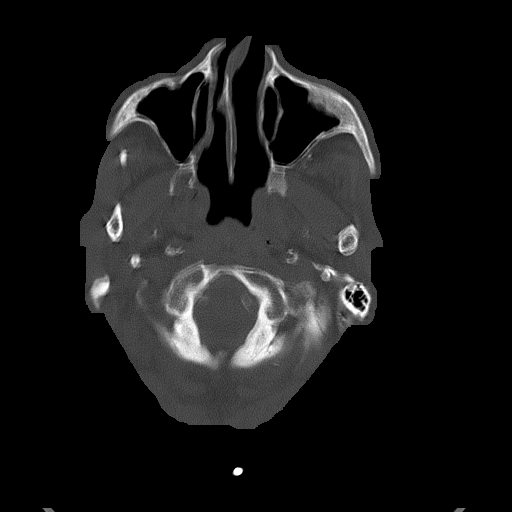
[im 17/81  bone]
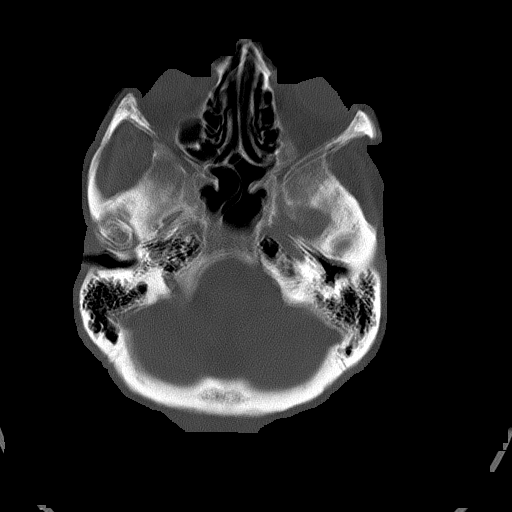

[Series 5: head without cor · coronal · non-contrast · 0.31mm/px · 3 of 67 slices shown]
[im 23/67  brain]
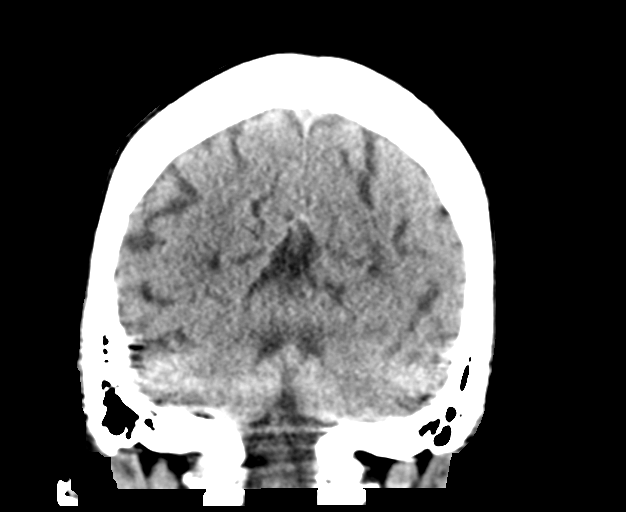
[im 30/67  brain]
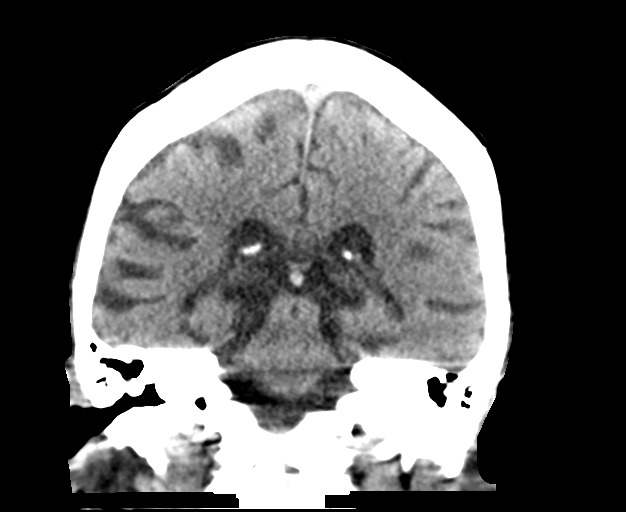
[im 37/67  brain]
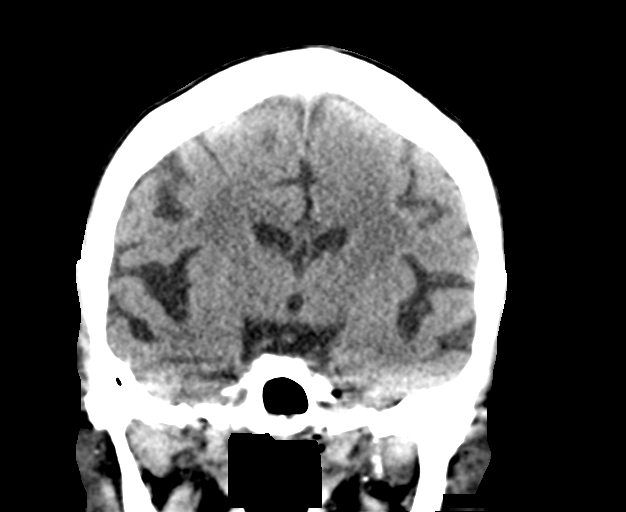

[Series 6: head without sag · sagittal · non-contrast · 0.31mm/px · 3 of 63 slices shown]
[im 21/63  brain]
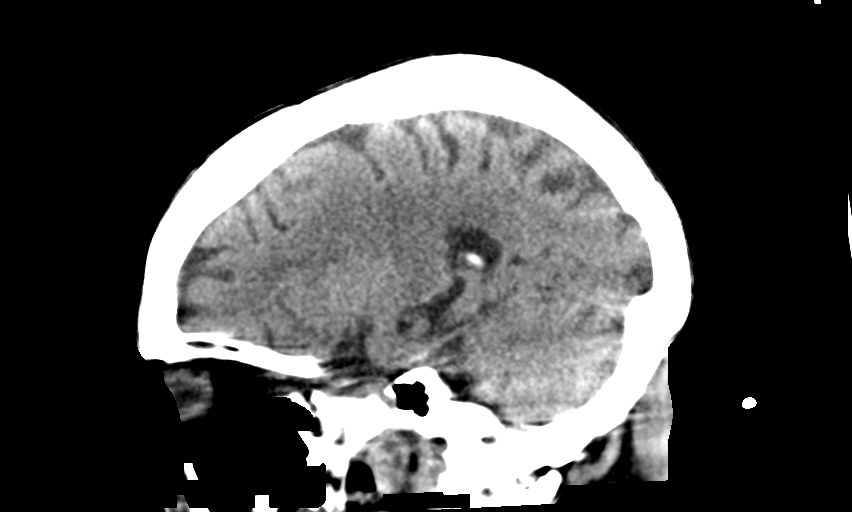
[im 32/63  brain]
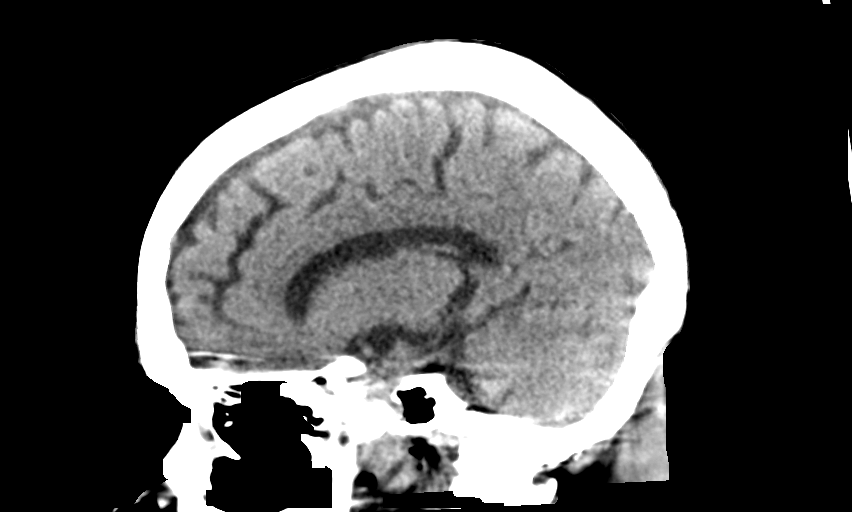
[im 42/63  brain]
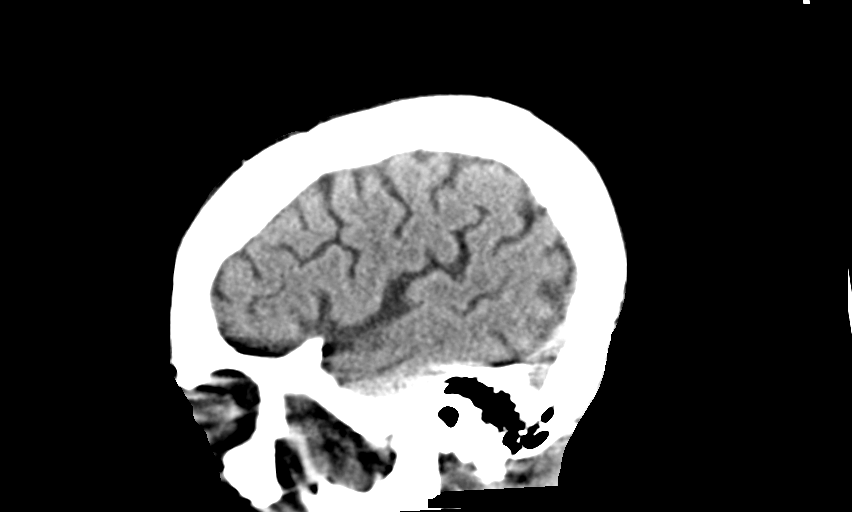

[15 of 47 positions shown; findings below may reference images not displayed]

FINDINGS: Brain: No acute infarct or hemorrhage. Lateral ventricles and
midline structures are unremarkable. No acute extra-axial fluid
collections. No mass effect.

Vascular: No hyperdense vessel or unexpected calcification.

Skull: Normal. Negative for fracture or focal lesion.

Sinuses/Orbits: No acute finding.

Other: None.
IMPRESSION: 1. No acute intracranial process.

## 2020-10-14 IMAGING — CT CT ANGIO AOBIFEM WO/W CM
1 of 10 series · 5 of 16 positions shown, 7 images · IV contrast (OMNI 350)
Comparison: None.

CLINICAL DATA: Bilateral lower extremity ischemia, lower extremity
pain and poor lower extremity arterial pulses.

EXAM:
CT ANGIOGRAPHY OF ABDOMINAL AORTA WITH ILIOFEMORAL RUNOFF
TECHNIQUE: Multidetector CT imaging of the abdomen, pelvis and lower
extremities was performed using the standard protocol during bolus
administration of intravenous contrast. Multiplanar CT image
reconstructions and MIPs were obtained to evaluate the vascular
anatomy.
CONTRAST:  100mL OMNIPAQUE IOHEXOL 350 MG/ML SOLN

[Series 6: cta runoff thins · axial · 0.77mm/px · z∈[-1540,-615]mm · 5 of 2777 slices shown, 7 images]
[im 463/2777  soft-tissue]
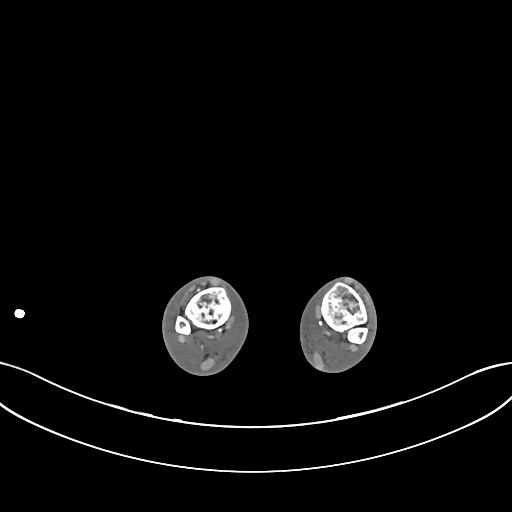
[im 463/2777  bone]
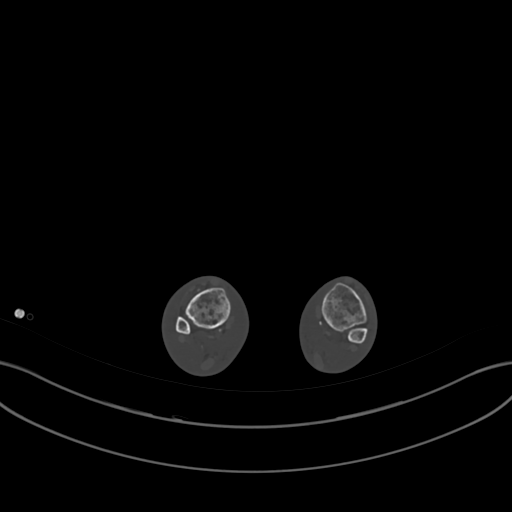
[im 926/2777  soft-tissue]
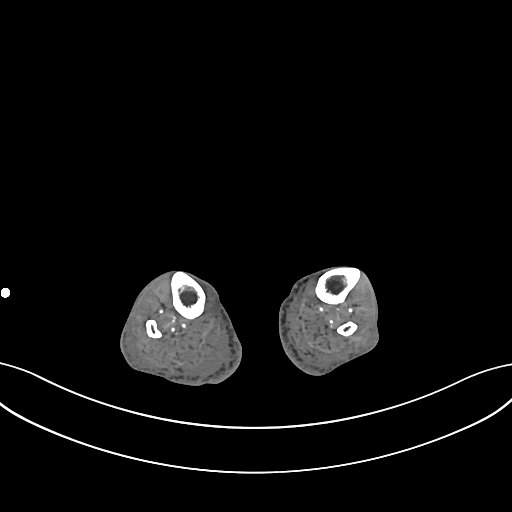
[im 1389/2777  soft-tissue]
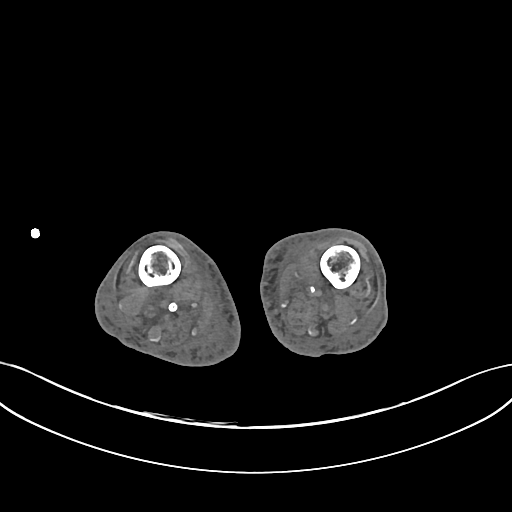
[im 1851/2777  soft-tissue]
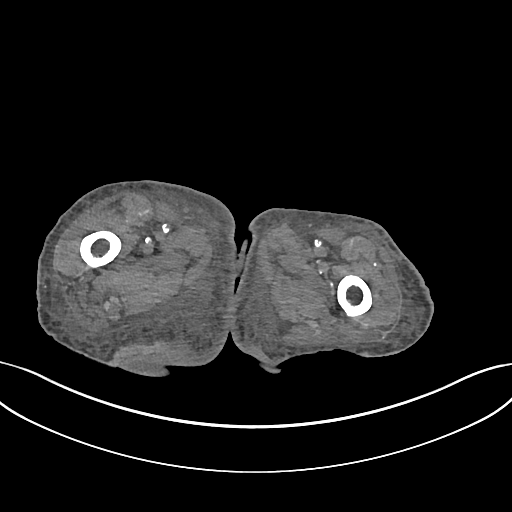
[im 2314/2777  soft-tissue]
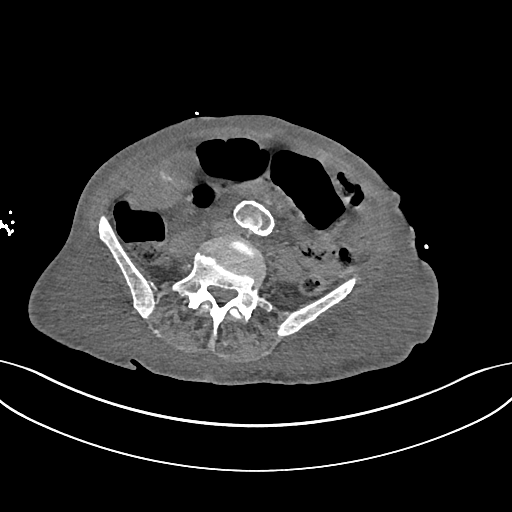
[im 2314/2777  bone]
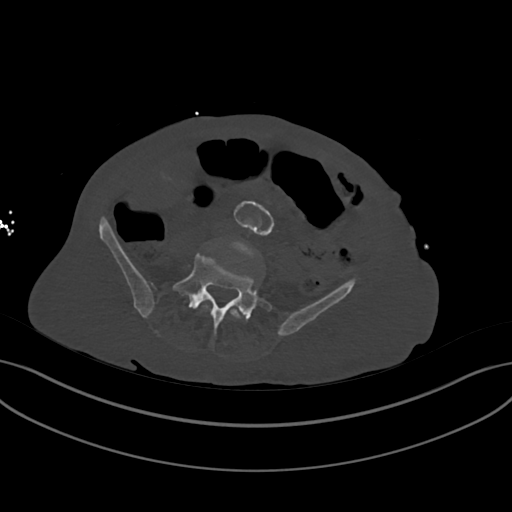

[5 of 16 positions shown; findings below may reference images not displayed]

FINDINGS: VASCULAR

Aorta: There is aneurysmal disease of the distal descending thoracic
aorta which reaches maximal caliber 3.9 cm just above the
diaphragmatic hiatus. The abdominal aorta is heavily calcified and
demonstrates severe atherosclerosis with mild distal aneurysmal
dilatation continuing to the aortic bifurcation. Maximal caliber of
the distal abdominal aorta is 3 cm.

Below the level of the renal arteries, the aortic lumen begins to
become narrowed and becomes nearly occluded at the aortic
bifurcation. All branch vessels of the abdominal aorta are heavily
calcified.

Celiac: The celiac trunk barely demonstrates a minimal amount of
proximal flow and becomes occluded before it bifurcates into splenic
and common hepatic arteries. Part of this appearance at the time of
CTA may be on the basis of slow flow and it would be difficult to
exclude eventual slow flow more distally into the celiac branches.

SMA: The superior mesenteric artery also barely contains any flow
proximally and becomes occluded by CTA in its trunk. Again, there
may be a component of very slow flow in the SMA trunk which cannot
be demonstrated by CTA.

Renals: Bilateral atretic renal arteries are heavily calcified and
appear to be chronically occluded.

IMA: The IMA is chronically occluded.

RIGHT Lower Extremity

Inflow: Iliac arteries are diffusely calcified. There is minimal
flow into the right common iliac artery which appears to be barely
patent. By the common iliac bifurcation, the artery is likely
occluded with complete occlusion present of the internal and
external iliac arteries on the right with no significant
reconstitution.

Outflow: The common femoral artery and superficial femoral artery
are heavily calcified and chronically occluded. There is no
reconstitution of the SFA. There is faint collateral reconstitution
of several profunda femoral intramuscular branches that can be
visualized extending into the thigh. The popliteal artery is
chronically occluded.

Runoff: Tibial vessels are heavily calcified. At the time of
imaging, there is no discernible contrast extending below the knee.

LEFT Lower Extremity

Inflow: The iliac arteries are diffusely calcified. The left common
iliac artery becomes occluded just beyond its origin and is
completely occluded. Left internal and external iliac arteries are
chronically occluded.

Outflow: The common femoral artery is occluded. The left superficial
femoral artery is completely chronically occluded. There is some
collateral reconstitution of some small profunda femoral
intramuscular branches. The popliteal artery is occluded.

Runoff: Below the knee, there is no contrast identified in the
tibial arteries.

Review of the MIP images confirms the above findings.

NON-VASCULAR

Lower chest: Significant cardiac enlargement. No visualized
pericardial fluid. Trace left pleural effusion.

Hepatobiliary: Massive reflux of contrast into the intrahepatic IVC
and throughout the hepatic veins is consistent with right heart
failure. Small cystic structure in the inferior right lobe of the
liver measuring approximately 1.1 cm is likely a benign cyst. The
gallbladder is not well visualized and is either severely contracted
or previously removed.

Pancreas: Unremarkable. No pancreatic ductal dilatation or
surrounding inflammatory changes.

Spleen: Normal in size without focal abnormality.

Adrenals/Urinary Tract: The kidneys are diffusely atrophic
bilaterally consistent with known end-stage renal disease.
Intraparenchymal arterial branches are diffusely calcified. No
hydronephrosis.

Stomach/Bowel: Bowel shows diffuse wall thickening which may be
pronounced by ascites in the peritoneal cavity. Component of bowel
ischemia cannot be excluded especially given vascular findings.
There is no evidence of free intraperitoneal air. Some degree of
colonic gaseous dilatation is consistent with a component of ileus.

Lymphatic: No enlarged lymph nodes are identified in the abdomen or
pelvis.

Reproductive: Uterus and bilateral adnexa are unremarkable.

Other: Moderate ascites is present in the peritoneal cavity. There
is diffuse body wall anasarca and also diffuse edema extending
throughout the lower extremities bilaterally. Some unusual higher
density outlining of fascial planes in the muscular compartments of
both lower extremities is also identified which is felt to most
likely represent leakage of contrast at the capillary level and may
be an ominous sign of diffuse muscular necrosis. No soft tissue gas
is identified in the lower extremities.

Musculoskeletal: No acute or significant osseous findings.
IMPRESSION: 1. Aneurysmal disease of the distal descending thoracic aorta which
reaches maximal caliber of 3.9 cm just above the diaphragmatic
hiatus.
2. Severe atherosclerosis of the abdominal aorta with mild
aneurysmal dilatation of the distal abdominal aorta continuing to
the aortic bifurcation. The distal abdominal aorta is nearly
occluded at the aortic bifurcation.
3. Occlusion of the celiac, superior mesenteric and bilateral renal
arteries. Part of this appearance may be on the basis of slow flow
and it would be difficult to exclude eventual slow flow more
distally into the celiac branches and SMA trunk.
4. Moderate ascites in the peritoneal cavity and diffuse bowel wall
thickening as well as component of probable colonic ileus. Diffuse
bowel ischemia cannot be excluded. No evidence of bowel perforation.
5. Minimal flow in the right common iliac artery and occlusion of
the left common iliac artery just beyond its origin.
6. Chronic occlusion of bilateral internal and external iliac
arteries.
7. Chronic occlusion of bilateral common femoral, superficial
femoral and popliteal arteries.
8. No discernible contrast identified in the tibial arteries below
the knees bilaterally.
9. Diffuse body wall anasarca and diffuse edema extending throughout
the lower extremities bilaterally. Some unusual higher density
outlining of fascial planes in the muscular compartments of both
lower extremities is felt to most likely represent leakage of
contrast at the capillary level and may be an ominous sign of
diffuse muscular necrosis. No soft tissue gas is identified in the
lower extremities.
10. Findings from the study were preliminary called to Dr. CHAI
by Dr. CHAI night at the time of the procedure and
communicated verbally.

## 2020-10-14 MED ORDER — LORAZEPAM 2 MG/ML IJ SOLN: 1.0000 mg | INTRAMUSCULAR | Status: DC | PRN

## 2020-10-14 MED ORDER — FENTANYL CITRATE (PF) 100 MCG/2ML IJ SOLN
50.0000 ug | Freq: Once | INTRAMUSCULAR | Status: AC
  Administered 2020-10-14: 21:00:00 50 ug via INTRAVENOUS
  Filled 2020-10-14: qty 2

## 2020-10-14 MED ORDER — MORPHINE SULFATE (PF) 2 MG/ML IV SOLN: 1.0000 mg | INTRAVENOUS | Status: DC | PRN

## 2020-10-14 MED ORDER — SODIUM CHLORIDE 0.9 % IV SOLN
1.0000 g | Freq: Once | INTRAVENOUS | Status: AC
  Administered 2020-10-14: 22:00:00 1 g via INTRAVENOUS
  Filled 2020-10-14: qty 1

## 2020-10-14 MED ORDER — HALOPERIDOL 1 MG PO TABS: 0.5000 mg | ORAL_TABLET | ORAL | Status: DC | PRN

## 2020-10-14 MED ORDER — NOREPINEPHRINE 4 MG/250ML-% IV SOLN
INTRAVENOUS | Status: AC
  Filled 2020-10-14: qty 250

## 2020-10-14 MED ORDER — ACETAMINOPHEN 650 MG RE SUPP: 650.0000 mg | Freq: Four times a day (QID) | RECTAL | Status: DC | PRN

## 2020-10-14 MED ORDER — IOHEXOL 350 MG/ML SOLN
100.0000 mL | Freq: Once | INTRAVENOUS | Status: AC | PRN
  Administered 2020-10-14: 100 mL via INTRAVENOUS

## 2020-10-14 MED ORDER — LORAZEPAM 2 MG/ML PO CONC: 1.0000 mg | ORAL | Status: DC | PRN

## 2020-10-14 MED ORDER — LACTATED RINGERS IV BOLUS
500.0000 mL | Freq: Once | INTRAVENOUS | Status: AC
  Administered 2020-10-14: 21:00:00 500 mL via INTRAVENOUS

## 2020-10-14 MED ORDER — ONDANSETRON HCL 4 MG/2ML IJ SOLN: 4.0000 mg | Freq: Four times a day (QID) | INTRAMUSCULAR | Status: DC | PRN

## 2020-10-14 MED ORDER — GLYCOPYRROLATE 1 MG PO TABS
1.0000 mg | ORAL_TABLET | ORAL | Status: DC | PRN
  Filled 2020-10-14: qty 1

## 2020-10-14 MED ORDER — VANCOMYCIN HCL IN DEXTROSE 1-5 GM/200ML-% IV SOLN
1000.0000 mg | Freq: Once | INTRAVENOUS | Status: AC
  Administered 2020-10-14: 21:00:00 1000 mg via INTRAVENOUS
  Filled 2020-10-14: qty 200

## 2020-10-14 MED ORDER — SODIUM CHLORIDE 0.9 % IV SOLN: Freq: Once | INTRAVENOUS | Status: AC

## 2020-10-14 MED ORDER — POLYVINYL ALCOHOL 1.4 % OP SOLN
1.0000 [drp] | Freq: Four times a day (QID) | OPHTHALMIC | Status: DC | PRN
  Filled 2020-10-14: qty 15

## 2020-10-14 MED ORDER — MORPHINE SULFATE (PF) 2 MG/ML IV SOLN: 2.0000 mg | INTRAVENOUS | Status: DC | PRN

## 2020-10-14 MED ORDER — BIOTENE DRY MOUTH MT LIQD: 15.0000 mL | OROMUCOSAL | Status: DC | PRN

## 2020-10-14 MED ORDER — ALBUTEROL SULFATE (2.5 MG/3ML) 0.083% IN NEBU
2.5000 mg | INHALATION_SOLUTION | RESPIRATORY_TRACT | Status: DC | PRN
Start: 2020-10-14 — End: 2020-10-15

## 2020-10-14 MED ORDER — DIPHENHYDRAMINE HCL 50 MG/ML IJ SOLN: 12.5000 mg | INTRAMUSCULAR | Status: DC | PRN

## 2020-10-14 MED ORDER — NOREPINEPHRINE 4 MG/250ML-% IV SOLN
0.0000 ug/min | INTRAVENOUS | Status: DC
  Administered 2020-10-14: 21:00:00 5 ug/min via INTRAVENOUS

## 2020-10-14 MED ORDER — ACETAMINOPHEN 325 MG PO TABS: 650.0000 mg | ORAL_TABLET | Freq: Four times a day (QID) | ORAL | Status: DC | PRN

## 2020-10-14 MED ORDER — HALOPERIDOL LACTATE 2 MG/ML PO CONC
0.5000 mg | ORAL | Status: DC | PRN
  Filled 2020-10-14: qty 0.3

## 2020-10-14 MED ORDER — ONDANSETRON 4 MG PO TBDP: 4.0000 mg | ORAL_TABLET | Freq: Four times a day (QID) | ORAL | Status: DC | PRN

## 2020-10-14 MED ORDER — MORPHINE SULFATE (PF) 4 MG/ML IV SOLN
4.0000 mg | Freq: Once | INTRAVENOUS | Status: AC
  Administered 2020-10-14: 22:00:00 4 mg via INTRAVENOUS
  Filled 2020-10-14: qty 1

## 2020-10-14 MED ORDER — GLYCOPYRROLATE 0.2 MG/ML IJ SOLN: 0.2000 mg | INTRAMUSCULAR | Status: DC | PRN

## 2020-10-14 MED ORDER — LORAZEPAM 1 MG PO TABS: 1.0000 mg | ORAL_TABLET | ORAL | Status: DC | PRN

## 2020-10-14 MED ORDER — HALOPERIDOL LACTATE 5 MG/ML IJ SOLN: 0.5000 mg | INTRAMUSCULAR | Status: DC | PRN

## 2020-10-14 NOTE — H&P (Signed)
History and Physical   Bethany [name] DJT:[id number] DOB: Oct 25, 1946 DOA: 10/14/2020  PCP: Buzzy Han, MD   Patient coming from: Home  Chief Complaint: Missed dialysis, altered mental status  HPI: Bethany [name] is a 74 y.o. female with medical history significant of ESRD on HD, COPD, drug use, hypertension, RLS, vascular disease who presents after multiple episodes of dialysis with altered mental status.  Patient has been refusing dialysis for weeks and refusing paramedics when her sister is at the sent over to her.  She was initially alert but altered in the ED she was able to follow commands and open her eyes and she continues to be able to open her eyes.  As below she did have an extensive etiology possible sepsis versus limb ischemia.  Vascular surgery said she is not a surgical candidate.  Due to her poor prognosis and her long-term dialysis refusal goals of care were discussed with her next of kin which are her sisters and patient was ultimately transitioned to full comfort care.  ED Course: Vitals in ED notable for hypotension in the 30S to 92Z systolic, tachypnea, bradycardia to the 50s, hypothermia to 92.1, hypoxia to 78 on room air.  Lab work-up showed bicarb less than 7, BUN greater than 100, creatinine of 17.59.  LFTs showed calcium 7.7, protein is 4.7, albumin 1.5, AST 60.  CBC showed hemoglobin of 9.3 down from baseline of 12.  PT and INR elevated at 21.8 respectively.  Troponin elevated 496, lactic acid elevated to greater than 11, CK greater than 1000.  Respiratory panel for flu and Covid negative.  Initially urinalysis urine culture blood culture ordered but these have been discontinued.  Chest x-ray showed chronic congestion CT head was without acute abnormality.  CTA did not show dissection but showed no flow beyond the aortic bifurcation.  Patient initially started on pressors, antibiotics, fluids but this has been discontinued as patient has been transitioned to  comfort care.  Review of Systems: As per HPI otherwise all other systems reviewed and are negative.  Past Medical History:  Diagnosis Date  . Anemia   . Chronic kidney disease   . COPD (chronic obstructive pulmonary disease) ([place]) 10/21/2019  . ESRD on dialysis ([place]) 08/03/2018  . Fall 10/11/2019  . Femoral hernia 06/30/2013   LEFT    . Heart murmur    as a child  . Hernia, femoral    left  . Hyperlipidemia    no meds aken  . Hypertension   . Pneumonia   . Smoking 10/21/2019    Past Surgical History:  Procedure Laterality Date  . AV FISTULA PLACEMENT Left 08/14/2018   Procedure: INSERTION OF ARTERIOVENOUS GRAFT LEFT ARM;  Surgeon: Serafina Mitchell, MD;  Location: [facility] OR;  Service: Vascular;  Laterality: Left;  . COLONOSCOPY    . TONSILLECTOMY AND ADENOIDECTOMY     at age 50- unsure if adenoids were taken out    Social History  reports that she has been smoking cigarettes. She has been smoking about 0.25 packs per day. She has never used smokeless tobacco. She reports current alcohol use of about 7.0 standard drinks of alcohol per week. She reports that she does not use drugs.  No Known Allergies  Family History  Problem Relation Age of Onset  . Heart attack Brother   . Breast cancer Other   . Skin cancer Other   . Colon cancer Neg Hx   . Pancreatic cancer Neg Hx   . Stomach cancer Neg  Hx   . Esophageal cancer Neg Hx   . Rectal cancer Neg Hx   Reviewed on admission  Prior to Admission medications   Medication Sig Start Date End Date Taking? Authorizing Provider  albuterol (VENTOLIN HFA) 108 (90 Base) MCG/ACT inhaler Inhale 2 puffs into the lungs every 6 (six) hours as needed for wheezing or shortness of breath. 03/22/20   Florencia Reasons, MD    Physical Exam: Vitals:   10/14/20 2032 10/14/20 2045 10/14/20 2115 10/14/20 2200  BP: (!) 55/26 98/82 111/65 (!) 57/45  Pulse: (!) 27 63    Resp: (!) 25 (!) 24 18 (!) 22  Temp:      TempSrc:      SpO2: (!) 67% (!) 78%      Physical Exam Constitutional:      General: She is not in acute distress.    Appearance: Normal appearance. She is ill-appearing.     Comments: Chronically ill-appearing elderly female, opens eyes and does not respond to voice.  HENT:     Head: Normocephalic and atraumatic.     Mouth/Throat:     Mouth: Mucous membranes are moist.     Pharynx: Oropharynx is clear.  Eyes:     Extraocular Movements: Extraocular movements intact.     Pupils: Pupils are equal, round, and reactive to light.  Cardiovascular:     Rate and Rhythm: Normal rate and regular rhythm.     Pulses: Normal pulses.     Heart sounds: Normal heart sounds.  Pulmonary:     Effort: Pulmonary effort is normal. No respiratory distress.     Breath sounds: Wheezing and rales present.  Abdominal:     General: Bowel sounds are normal. There is no distension.     Palpations: Abdomen is soft.     Tenderness: There is no abdominal tenderness.  Musculoskeletal:        General: No swelling or deformity.     Right lower leg: Edema present.     Left lower leg: Edema present.  Skin:    General: Skin is dry.     Comments: Bilateral lower extremities cool to the touch  Neurological:     Comments: Opens eyes to voice but does not respond to voice.    Labs on Admission: I have personally reviewed following labs and imaging studies  CBC: Recent Labs  Lab 10/14/20 1940 10/14/20 1948  WBC 6.7  --   NEUTROABS 5.6  --   HGB 9.3* 11.2*  HCT 32.2* 33.0*  MCV 91.0  --   PLT 150  --     Basic Metabolic Panel: Recent Labs  Lab 10/14/20 1940 10/14/20 1948  NA 141 138  K 4.4 4.3  CL 102 108  CO2 <7*  --   GLUCOSE 130* 112*  BUN 107* 112*  CREATININE 17.59* 16.90*  CALCIUM 7.7*  --   MG 2.6*  --     GFR: CrCl cannot be calculated (Unknown ideal weight.).  Liver Function Tests: Recent Labs  Lab 10/14/20 1940  AST 60*  ALT 20  ALKPHOS 103  BILITOT 1.1  PROT 4.7*  ALBUMIN 1.5*    Urine analysis:     Component Value Date/Time   COLORURINE YELLOW 03/17/2020 [place] 03/17/2020 1437   LABSPEC 1.005 03/17/2020 1437   PHURINE 9.0 (H) 03/17/2020 1437   GLUCOSEU 50 (A) 03/17/2020 1437   HGBUR LARGE (A) 03/17/2020 [place] 03/17/2020 1437   [place] 03/17/2020  [place] >=300 (A) 03/17/2020 1437   NITRITE NEGATIVE 03/17/2020 1437   LEUKOCYTESUR NEGATIVE 03/17/2020 1437    Radiological Exams on Admission: CT Head Wo Contrast  Result Date: 10/14/2020 CLINICAL DATA:  [place]ium EXAM: CT HEAD WITHOUT CONTRAST TECHNIQUE: Contiguous axial images were obtained from the base of the skull through the vertex without intravenous contrast. COMPARISON:  03/17/2020 FINDINGS: Brain: No acute infarct or hemorrhage. Lateral ventricles and midline structures are unremarkable. No acute extra-axial fluid collections. No mass effect. Vascular: No hyperdense vessel or unexpected calcification. Skull: Normal. Negative for fracture or focal lesion. Sinuses/Orbits: No acute finding. Other: None. IMPRESSION: 1. No acute intracranial process. Electronically Signed   By: Randa Ngo M.D.   On: 10/14/2020 20:37   DG Chest Portable 1 View  Result Date: 10/14/2020 CLINICAL DATA:  End-stage renal disease, missed dialysis appointment EXAM: PORTABLE CHEST 1 VIEW COMPARISON:  09/20/2020 FINDINGS: Single frontal view of the chest demonstrates persistent enlargement the cardiac silhouette. Chronic central vascular congestion. Increased density at the left lung base consistent with consolidation and/or effusion. This is decreased since prior study. No pneumothorax. No acute bony abnormalities. IMPRESSION: 1. Decreased left basilar consolidation and/or effusion. 2. Chronic central vascular congestion. Electronically Signed   By: Randa Ngo M.D.   On: 10/14/2020 19:54    EKG: Independently reviewed. Sinus tachycardia, 101 bpm, right bundle branch  block  Assessment/Plan Principal Problem:   Shock ([place]) Active Problems:   COPD (chronic obstructive pulmonary disease) (HCC)   Lactic acidosis   Essential hypertension   Acute encephalopathy   ESRD on hemodialysis (HCC)   End of life care  Comfort care > Patient transition to comfort care due to severe illness as below, refusing dialysis, not a surgical candidate. > Discontinue antibiotics, pressors > DNR - As needed morphine for pain or dyspnea - As needed help at all for agitation or delirium - As needed Ativan for anxiety - As needed glycopyrrolate for excessive secretions - As needed Zofran - As needed Benadryl - As needed albuterol nebs - As needed oral rinse - As needed Liquifilm Tears  Shock Acute encephalopathy > Etiology cannular likely secondary to sepsis, with limb ischemia possibly playing a role as well > Blood pressure is low as the 62I to 29N systolic before starting pressors initially in the ED > Troponin elevated to 500, lactic acid greater than 11, CK greater than thousand, PT and INR elevated, creatinine elevated to 17 in the setting of refusing dialysis > Unable to review results of CTA but based on notes from ED patient did not have blood flow beyond her aortic bifurcation leading to limb ischemia, evidence was discussed with vascular surgery and she is deemed not a surgical candidate. > Based on her poor prognosis patient was made full comfort care after further discussions with the EDP > I have confirmed the patient is full comfort care with both the patient's next of kin, her 2 sisters  ESRD on HD, COPD, drug use, hypertension, RLS, vascular disease > Holding home meds as patient is comfort care - As needed albuterol for COPD as above  DVT prophylaxis: None, comfort care  Code Status:   DNR  Family Communication:  Discussed with her sisters at bedside  Disposition Plan:   Patient is from:  Home  Anticipated DC to:  In hospital death  Anticipated  DC date:  1 day  Anticipated DC barriers: N/A  Consults called:  ED consulted vascular surgery, I have placed an  order to palliative care  Admission status:  Observation, MedSurg   Severity of Illness: The appropriate patient status for this patient is OBSERVATION. Observation status is judged to be reasonable and necessary in order to provide the required intensity of service to ensure the patient's safety. The patient's presenting symptoms, physical exam findings, and initial radiographic and laboratory data in the context of their medical condition is felt to place them at decreased risk for further clinical deterioration. Furthermore, it is anticipated that the patient will be medically stable for discharge from the hospital within 2 midnights of admission. The following factors support the patient status of observation.   " The patient's presenting symptoms include encephalopathy, weakness. " The physical exam findings include rales, wheezing, cool lower extremities. " The initial radiographic and laboratory data are concerning for sepsis and ischemia.  Lactic acid greater than 11, blood pressure in the 23F to 57D systolic without pressors.Marcelyn Bruins MD [facility]  How to contact the [facility] Attending or Consulting provider  or covering provider during after hours [place], for this patient?   1. Check the care team in [facility] and look for a) attending/consulting [facility] provider listed and b) the [facility] team listed 2. Log into [email or web address] and use [place]'s universal password to access. If you do not have the password, please contact the hospital operator. 3. Locate the [facility] provider you are looking for under [facility] and page to a number that you can be directly reached. 4. If you still have difficulty reaching the provider, please page the [facility] (Director on Call) for the Hospitalists listed on amion for assistance.  10/14/2020, 10:56 PM

## 2020-10-14 NOTE — ED Notes (Signed)
Monitor on comfort care.  Family at Bedside.

## 2020-10-14 NOTE — ED Provider Notes (Addendum)
Please see Caroline's note for full EDP documentation.  In brief, Bethany Bethany is a 74 year old dialysis dependent lady who presented to ER with initial report of missed dialysis.  Reportedly has refused going to dialysis for many weeks.  On initial assessment, patient noted to be altered but following commands, opening eyes, but quite altered, confused, unable to provide any history. Additionally noted hypothermic, initially normotensive.  Patient appears critically ill.  Commenced broad work-up.  EKG negative for STEMI, troponin was elevated.  No report of chest pain, lower suspicion that ACS is driving cause but certainly possible.  Concern for possibility of sepsis, started broad-spectrum antibiotics, provided fluid bolus.  Noted cold feet, unable to Doppler pulses.  CTA aortobifem study ordered to evaluate for limb ischemia or possibly dissection -initial read from radiologist was negative for dissection, however patient does not have flow past the bifurcation.  Suspect this finding is most likely related to severe chronic vascular disease in addition to her acute shock.  Discussed with vascular surgery Dr. Carlis Abbott - patient would not be any sort of surgical candidate.  Lactic acid greater than 11.  While the inciting event for her presentation today is not clear at this time, based upon her lab and imaging work-up as well as overall appearance, very concerned that patient has an extremely grim prognosis and even with very aggressive medical intervention, patient will likely die.    Lengthy goals of care conversation with patient's Bethany Bethany who reports she is next of kin, patient does not have any children and is not married.  Discussed treatment options with Bethany.  She states patient would not want to be placed on life support, would not want mechanical ventilation, or invasive procedures and given the current grim prognosis, she believes patient would want to pursue comfort measures. She is  coming to the hospital.   Placed DNR/DNI orders.  No escalation of care from present.  Continue fluids, ABX, Levophed at 10 ok for now.  Anticipate discontinuing these measures and only focusing on comfort once Bethany has arrived.   I have reviewed case in detail with Dr. Dalia Heading at time of sign out. She and Bethany Bethany will monitor patient closely and continue goals of care discussion when daughter arrives.     Bethany Starch, MD 10/14/20 2123   Bethany Bethany, Bethany Bethany. She clarified patient has two sisters (her and Bethany Bethany). Bethany Bethany is en route. They are both in agreement with transition to comfort measures.     Bethany Starch, MD 10/14/20 2140  Bethany Bethany. Both she and her Bethany want to pursue comfort measures only. Updated staff. Dc abx, pressors. Prn morphine for pain, prn ativan for agitation. Bethany Bethany d/w hosp for admit.    Bethany Starch, MD 10/14/20 2211

## 2020-10-14 NOTE — ED Notes (Signed)
Unable to obtain accurate pulse ox readings on patient. Blood pressure cuff repositioned to R forearm; bps reading hypotensive. MD aware

## 2020-10-14 NOTE — ED Triage Notes (Signed)
Pt BIB by [facility] from home for failure to thrive.  Pt has reportedly missed the last 8 dialysis appointments and has been refusing the requests of her home health nurse to go to the hospital for evaluation.

## 2020-10-14 NOTE — ED Notes (Addendum)
Pt rectal temp is 92.1

## 2020-10-14 NOTE — ED Provider Notes (Addendum)
.  Critical Care Performed by: [name], MD Authorized by: [name], MD   Critical care provider statement:    Critical care time (minutes):  79   Critical care was necessary to treat or prevent imminent or life-threatening deterioration of the following conditions:  Cardiac failure, CNS failure or compromise, shock, respiratory failure, circulatory failure and metabolic crisis   Critical care was time spent personally by me on the following activities:  Discussions with consultants, evaluation of patient's response to treatment, examination of patient, ordering and performing treatments and interventions, ordering and review of laboratory studies, ordering and review of radiographic studies, pulse oximetry, re-evaluation of patient's condition, obtaining history from patient or surrogate and review of old charts  Ultrasound ED Peripheral IV (Provider)  Date/Time: 10/14/2020 9:28 PM Performed by: [name], MD Authorized by: [name], MD   Procedure details:    Indications: hydration, hypotension and multiple failed IV attempts     Location:  Right AC   Angiocath:  20 G   Bedside Ultrasound Guided: Yes     Images: not archived     Patient tolerated procedure without complications: Yes     Dressing applied: Yes        [name], MD 10/14/20 2124    [name], MD 10/14/20 2129

## 2020-10-14 NOTE — ED Provider Notes (Signed)
[facility] EMERGENCY DEPARTMENT Provider Note   CSN: [id number] Arrival date & time: 10/14/20  1906     History Chief Complaint  Patient presents with  . Altered Mental Status    Bethany [name] is a 74 y.o. female with a past medical history significant for ESRD on dialysis Tuesday/Thursday/Saturday, hyperlipidemia, COPD, and hypertension who presents to the ED due to 3 weeks of missed dialysis. Patient unable to give history due to alerted mental status. I spoke to sister, [name] on the phone who notes that patient has not been wanting to go to dialysis for the past 3 weeks. Her family has called EMS 4-5 times, but when EMS arrives to house they note patient to be in her right state of mind and could not force patient to report to the ED. Dr. Roslynn Amble discussed code status with sister who notes DNR/DNI and transitioning to comfort care. Patient has no children and sister is next of kin. Sister states that she noticed patient's legs started to change color earlier today. She also notes that patient has been decompensating over the past few days with decreased movement.   Level 5 caveat secondary to altered mental status.  History obtained from sister and past medical records. No interpreter used during encounter.      Past Medical History:  Diagnosis Date  . Anemia   . Chronic kidney disease   . COPD (chronic obstructive pulmonary disease) ([place]) 10/21/2019  . ESRD on dialysis ([place]) 08/03/2018  . Fall 10/11/2019  . Femoral hernia 06/30/2013   LEFT    . Heart murmur    as a child  . Hernia, femoral    left  . Hyperlipidemia    no meds aken  . Hypertension   . Pneumonia   . Smoking 10/21/2019    Patient Active Problem List   Diagnosis Date Noted  . ESRD on hemodialysis ([place]) 09/07/2020  . Anemia of chronic kidney failure, stage 5 ([place]) 09/07/2020  . Pleural effusion, bilateral 09/07/2020  . Acute respiratory failure with hypoxia ([place]) 03/21/2020  . Toxic  encephalopathy 03/21/2020  . Acute encephalopathy 03/17/2020  . Acute metabolic encephalopathy 70/35/0093  . Acute delirium 03/10/2020  . Witnessed seizure-like activity ([place]) 03/10/2020  . Hypoglycemia 03/10/2020  . Lactic acidosis 03/10/2020  . Elevated brain natriuretic peptide (BNP) level 03/10/2020  . Drug abuse ([place]) 03/10/2020  . Essential hypertension 03/10/2020  . COPD (chronic obstructive pulmonary disease) ([place]) 10/21/2019  . Restless leg syndrome 10/21/2019  . Smoking 10/21/2019  . Confusion   . Altered mental status, unspecified 10/20/2019  . Fall 10/11/2019  . ESRD (end stage renal disease) ([place]) 08/03/2018    Past Surgical History:  Procedure Laterality Date  . AV FISTULA PLACEMENT Left 08/14/2018   Procedure: INSERTION OF ARTERIOVENOUS GRAFT LEFT ARM;  Surgeon: Serafina Mitchell, MD;  Location: [facility] OR;  Service: Vascular;  Laterality: Left;  . COLONOSCOPY    . TONSILLECTOMY AND ADENOIDECTOMY     at age 68- unsure if adenoids were taken out     OB History   No obstetric history on file.     Family History  Problem Relation Age of Onset  . Heart attack Brother   . Breast cancer Other   . Skin cancer Other   . Colon cancer Neg Hx   . Pancreatic cancer Neg Hx   . Stomach cancer Neg Hx   . Esophageal cancer Neg Hx   . Rectal cancer Neg Hx  Social History   Tobacco Use  . Smoking status: Current Every Day Smoker    Packs/day: 0.25    Types: Cigarettes  . Smokeless tobacco: Never Used  Vaping Use  . Vaping Use: Never used  Substance Use Topics  . Alcohol use: Yes    Alcohol/week: 7.0 standard drinks    Types: 7 Glasses of wine per week    Comment: occasional  . Drug use: No    Comment: marijuana     Home Medications Prior to Admission medications   Medication Sig Start Date End Date Taking? Authorizing Provider  albuterol (VENTOLIN HFA) 108 (90 Base) MCG/ACT inhaler Inhale 2 puffs into the lungs every 6 (six) hours as needed for wheezing or  shortness of breath. 03/22/20   Florencia Reasons, MD    Allergies    Patient has no known allergies.  Review of Systems   Review of Systems  Unable to perform ROS: Mental status change    Physical Exam Updated Vital Signs BP 111/65   Pulse 63   Temp (!) 92.1 F (33.4 C) (Rectal)   Resp 18   SpO2 (!) 78%   Physical Exam Vitals and nursing note reviewed.  Constitutional:      General: She is in acute distress.     Appearance: She is ill-appearing.     Comments: Severely lethargic  HENT:     Head: Normocephalic.  Eyes:     Pupils: Pupils are equal, round, and reactive to light.  Cardiovascular:     Rate and Rhythm: Normal rate and regular rhythm.     Pulses: Normal pulses.     Heart sounds: Normal heart sounds. No murmur heard. No friction rub. No gallop.      Comments: Lower extremities cold to touch with no palpated pulses.  No pulses appreciated with bedside Doppler or ultrasound. Pulmonary:     Effort: Pulmonary effort is normal.     Breath sounds: Normal breath sounds.  Abdominal:     General: Abdomen is flat. Bowel sounds are normal. There is no distension.     Palpations: Abdomen is soft.     Tenderness: There is no abdominal tenderness. There is no guarding or rebound.  Musculoskeletal:     Cervical back: Neck supple.     Right lower leg: Edema present.     Left lower leg: Edema present.     Comments: 2+ pitting edema bilateral ankles.  Skin:    Coloration: Skin is pale.  Neurological:     General: No focal deficit present.  Psychiatric:        Mood and Affect: Mood normal.     ED Results / Procedures / Treatments   Labs (all labs ordered are listed, but only abnormal results are displayed) Labs Reviewed  AMMONIA - Abnormal; Notable for the following components:      Result Value   Ammonia 121 (*)    All other components within normal limits  CBC WITH DIFFERENTIAL/PLATELET - Abnormal; Notable for the following components:   RBC 3.54 (*)    Hemoglobin 9.3  (*)    HCT 32.2 (*)    MCHC 28.9 (*)    RDW 18.7 (*)    nRBC 0.4 (*)    Abs Immature Granulocytes 0.15 (*)    All other components within normal limits  COMPREHENSIVE METABOLIC PANEL - Abnormal; Notable for the following components:   CO2 <7 (*)    Glucose, Bld 130 (*)    BUN 107 (*)  Creatinine, Ser 17.59 (*)    Calcium 7.7 (*)    Total Protein 4.7 (*)    Albumin 1.5 (*)    AST 60 (*)    GFR, Estimated 2 (*)    All other components within normal limits  LACTIC ACID, PLASMA - Abnormal; Notable for the following components:   Lactic Acid, Venous >11.0 (*)    All other components within normal limits  MAGNESIUM - Abnormal; Notable for the following components:   Magnesium 2.6 (*)    All other components within normal limits  PROTIME-INR - Abnormal; Notable for the following components:   Prothrombin Time 20.3 (*)    INR 1.8 (*)    All other components within normal limits  CK - Abnormal; Notable for the following components:   Total CK 1,188 (*)    All other components within normal limits  I-STAT CHEM 8, ED - Abnormal; Notable for the following components:   BUN 112 (*)    Creatinine, Ser 16.90 (*)    Glucose, Bld 112 (*)    Calcium, Ion 0.89 (*)    TCO2 9 (*)    Hemoglobin 11.2 (*)    HCT 33.0 (*)    All other components within normal limits  TROPONIN I (HIGH SENSITIVITY) - Abnormal; Notable for the following components:   Troponin I (High Sensitivity) 496 (*)    All other components within normal limits  RESP PANEL BY RT-PCR (FLU A&B, COVID) ARPGX2  URINE CULTURE  CULTURE, BLOOD (ROUTINE X 2)  CULTURE, BLOOD (ROUTINE X 2)  RAPID URINE DRUG SCREEN, HOSP PERFORMED  LACTIC ACID, PLASMA  URINALYSIS, ROUTINE W REFLEX MICROSCOPIC  TROPONIN I (HIGH SENSITIVITY)    EKG EKG Interpretation  Date/Time:  Saturday October 14 2020 19:17:42 EST Ventricular Rate:  101 PR Interval:    QRS Duration: 144 QT Interval:  456 QTC Calculation: 552 R Axis:   -73 Text  Interpretation: Sinus tachycardia LAE, consider biatrial enlargement Right bundle branch block Anterolateral infarct, age indeterminate Confirmed by Madalyn Rob [phone]) on 10/14/2020 7:34:16 PM   Radiology CT Head Wo Contrast  Result Date: 10/14/2020 CLINICAL DATA:  Delirium EXAM: CT HEAD WITHOUT CONTRAST TECHNIQUE: Contiguous axial images were obtained from the base of the skull through the vertex without intravenous contrast. COMPARISON:  03/17/2020 FINDINGS: Brain: No acute infarct or hemorrhage. Lateral ventricles and midline structures are unremarkable. No acute extra-axial fluid collections. No mass effect. Vascular: No hyperdense vessel or unexpected calcification. Skull: Normal. Negative for fracture or focal lesion. Sinuses/Orbits: No acute finding. Other: None. IMPRESSION: 1. No acute intracranial process. Electronically Signed   By: Randa Ngo M.D.   On: 10/14/2020 20:37   DG Chest Portable 1 View  Result Date: 10/14/2020 CLINICAL DATA:  End-stage renal disease, missed dialysis appointment EXAM: PORTABLE CHEST 1 VIEW COMPARISON:  09/20/2020 FINDINGS: Single frontal view of the chest demonstrates persistent enlargement the cardiac silhouette. Chronic central vascular congestion. Increased density at the left lung base consistent with consolidation and/or effusion. This is decreased since prior study. No pneumothorax. No acute bony abnormalities. IMPRESSION: 1. Decreased left basilar consolidation and/or effusion. 2. Chronic central vascular congestion. Electronically Signed   By: Randa Ngo M.D.   On: 10/14/2020 19:54    Procedures Procedures (including critical care time) CRITICAL CARE Performed by: Suzy Bouchard   Total critical care time: 30 minutes  Critical care time was exclusive of separately billable procedures and treating other patients.  Critical care was necessary to treat or  prevent imminent or life-threatening deterioration.  Critical care was time  spent personally by me on the following activities: development of treatment plan with patient and/or surrogate as well as nursing, discussions with consultants, evaluation of patient's response to treatment, examination of patient, obtaining history from patient or surrogate, ordering and performing treatments and interventions, ordering and review of laboratory studies, ordering and review of radiographic studies, pulse oximetry and re-evaluation of patient's condition.  Medications Ordered in ED Medications  norepinephrine (LEVOPHED) 4mg  in 250mL premix infusion (5 mcg/min Intravenous New Bag/Given 10/14/20 2039)  vancomycin (VANCOCIN) IVPB 1000 mg/200 mL premix (1,000 mg Intravenous New Bag/Given 10/14/20 2124)  morphine 4 MG/ML injection 4 mg (has no administration in time range)  LORazepam (ATIVAN) injection 1 mg (has no administration in time range)  morphine 2 MG/ML injection 2 mg (has no administration in time range)  lactated ringers bolus 500 mL (0 mLs Intravenous Stopped 10/14/20 2039)  0.9 %  sodium chloride infusion ( Intravenous New Bag/Given 10/14/20 2040)  ceFEPIme (MAXIPIME) 1 g in sodium chloride 0.9 % 100 mL IVPB (0 g Intravenous Stopped 10/14/20 2137)  fentaNYL (SUBLIMAZE) injection 50 mcg (50 mcg Intravenous Given 10/14/20 2110)  iohexol (OMNIPAQUE) 350 MG/ML injection 100 mL (100 mLs Intravenous Contrast Given 10/14/20 2121)    ED Course  I have reviewed the triage vital signs and the nursing notes.  Pertinent labs & imaging results that were available during my care of the patient were reviewed by me and considered in my medical decision making (see chart for details).  Clinical Course as of 10/14/20 2211  Sat Oct 14, 2020  2005 D/w Carlis Abbott - recommends CTA aortobifem w runoff [RD]  2016 Hemoglobin(!): 9.3 [CA]  2031 Ammonia(!): 121 [CA]  2040 Troponin I (High Sensitivity)(!!): 496 [CA]  2040 Magnesium(!): 2.6 [CA]  2107 Temp(!): 92.1 F (33.4 C) [CA]  2107 Pulse  Rate(!): 27 [CA]  2107 BP(!): 146/111 [CA]  2107 BP(!): 55/26 [CA]  2107 SpO2(!): 67 % [CA]  2107 Resp(!): 25 [CA]  2107 Lactic Acid, Venous(!!): >11.0 [CA]  2107 CK Total(!): 1,188 [CA]    Clinical Course User Index [CA] Suzy Bouchard, PA-C [RD] [name], MD   MDM Rules/Calculators/A&P                         Patient presents to the ED due to 3 weeks of missed dialysis and altered mental status. Patient critically ill upon arrival. Patient unable to provide any history. Patient has history of ESRD on dialysis, noncompliance, drug abuse, and numerous hospital admission for encephalopathy. Patient hypothermic on arrival. [facility] applied. On exam, patient confused, opens eyes to voice, noted to have pulseless feet/legs concern for limb ischemia vs dissection. CTA aortobifem ordered Patient normotensive upon arrival then became hypotensive. Levophed and IVFs pushed.  Sepsis labs drawn to rule out infectious etiology. IVFs and antibiotics started. Broad workup initiated after initial evaluation. I personally spoke to patient's daughter, [name], on the phone to discuss extent of illness.   EKG personally reviewed which demonstrates sinus tachycardia with no signs of acute ischemia. Elevated troponin. Lower suspicion for ACS given normal EKG and no reported chest pain. Dr. Roslynn Amble discussed with radiologist about read from CTA aortobifem which was negative for dissection, however showed no flow fast the bifurcation. Decreased perfussion likely chronic issues worsened by acute shock. Dr. Roslynn Amble discussed with vascular surgery (Dr. Carlis Abbott) who notes patient is not a surgical candidate at  this time.  Lactic acid elevated greater than 11.  CK elevated at 1,188. IVFs already started. CXR personally reviewed which demonstrates:     IMPRESSION:  1. Decreased left basilar consolidation and/or effusion.  2. Chronic central vascular congestion.   CT head personally reviewed which is  negative for any acute abnormalities.   Patient decompensating quickly while in the ED. Dr. Roslynn Amble discussed code status with sister. See his note for full details. DNR/DNI orders placed. Patient switched to comfort care per sister, Barbara's request. Will consult hospitalist for admission for comfort care.   Discussed case with Dr. Trilby Drummer with [facility] who will admit for comfort care. Final Clinical Impression(s) / ED Diagnoses Final diagnoses:  Altered mental status, unspecified altered mental status type    Rx / DC Orders ED Discharge Orders    None       Karie Kirks 10/14/20 2215    [name], MD 10/16/20 1517

## 2020-10-14 NOTE — ED Notes (Signed)
Pt's Sister states that she missed a call from the nurse, callback [phone], [name]

## 2020-10-19 LAB — CULTURE, BLOOD (ROUTINE X 2)
Culture: NO GROWTH
Special Requests: ADEQUATE

## 2020-10-28 NOTE — ED Notes (Signed)
Patient placement called

## 2020-10-28 NOTE — ED Notes (Signed)
Pt states that earlier pain medication, given by Vonna Kotyk, RN, has been effective at this time

## 2020-10-28 NOTE — ED Notes (Signed)
RN paged Dr. Pietro Cassis, assigned attending, x2 for death certificate.

## 2020-10-28 NOTE — ED Notes (Signed)
Called patient placement. Pt placement unable to answer at this time. Will call back shortly.

## 2020-10-28 DEATH — deceased
